# Patient Record
Sex: Male | Born: 1965 | Race: Black or African American | Hispanic: No | Marital: Married | Smoking: Former smoker
Health system: Southern US, Community
[De-identification: ages and names within clinical notes are randomized; demographics above are authoritative.]

## PROBLEM LIST (undated history)

## (undated) DIAGNOSIS — K219 Gastro-esophageal reflux disease without esophagitis: Secondary | ICD-10-CM

## (undated) DIAGNOSIS — R12 Heartburn: Secondary | ICD-10-CM

## (undated) DIAGNOSIS — G4730 Sleep apnea, unspecified: Secondary | ICD-10-CM

## (undated) DIAGNOSIS — M069 Rheumatoid arthritis, unspecified: Secondary | ICD-10-CM

## (undated) DIAGNOSIS — J189 Pneumonia, unspecified organism: Secondary | ICD-10-CM

## (undated) DIAGNOSIS — R1310 Dysphagia, unspecified: Secondary | ICD-10-CM

## (undated) DIAGNOSIS — M549 Dorsalgia, unspecified: Secondary | ICD-10-CM

## (undated) DIAGNOSIS — M25559 Pain in unspecified hip: Secondary | ICD-10-CM

## (undated) DIAGNOSIS — I10 Essential (primary) hypertension: Secondary | ICD-10-CM

## (undated) DIAGNOSIS — M25569 Pain in unspecified knee: Secondary | ICD-10-CM

## (undated) DIAGNOSIS — D649 Anemia, unspecified: Secondary | ICD-10-CM

## (undated) DIAGNOSIS — E7800 Pure hypercholesterolemia, unspecified: Secondary | ICD-10-CM

## (undated) DIAGNOSIS — M109 Gout, unspecified: Secondary | ICD-10-CM

## (undated) HISTORY — DX: Pain in unspecified hip: M25.559

## (undated) HISTORY — PX: HERNIA REPAIR: SHX51

## (undated) HISTORY — DX: Heartburn: R12

## (undated) HISTORY — DX: Rheumatoid arthritis, unspecified: M06.9

## (undated) HISTORY — DX: Dysphagia, unspecified: R13.10

## (undated) HISTORY — DX: Anemia, unspecified: D64.9

## (undated) HISTORY — DX: Pure hypercholesterolemia, unspecified: E78.00

## (undated) HISTORY — DX: Dorsalgia, unspecified: M54.9

## (undated) HISTORY — DX: Pain in unspecified knee: M25.569

## (undated) HISTORY — DX: Gout, unspecified: M10.9

## (undated) HISTORY — DX: Sleep apnea, unspecified: G47.30

---

## 2018-01-27 ENCOUNTER — Other Ambulatory Visit: Payer: Self-pay

## 2018-01-27 ENCOUNTER — Emergency Department (HOSPITAL_COMMUNITY): Payer: Self-pay

## 2018-01-27 ENCOUNTER — Encounter (HOSPITAL_COMMUNITY): Payer: Self-pay | Admitting: *Deleted

## 2018-01-27 ENCOUNTER — Emergency Department (HOSPITAL_COMMUNITY)
Admission: EM | Admit: 2018-01-27 | Discharge: 2018-01-27 | Disposition: A | Discharge status: Home / Self Care | Payer: Self-pay | Attending: Emergency Medicine | Admitting: Emergency Medicine

## 2018-01-27 ENCOUNTER — Encounter: Payer: Self-pay | Admitting: Neurology

## 2018-01-27 DIAGNOSIS — Z79899 Other long term (current) drug therapy: Secondary | ICD-10-CM | POA: Insufficient documentation

## 2018-01-27 DIAGNOSIS — G43109 Migraine with aura, not intractable, without status migrainosus: Secondary | ICD-10-CM | POA: Insufficient documentation

## 2018-01-27 DIAGNOSIS — R0602 Shortness of breath: Secondary | ICD-10-CM | POA: Insufficient documentation

## 2018-01-27 DIAGNOSIS — R0789 Other chest pain: Secondary | ICD-10-CM | POA: Insufficient documentation

## 2018-01-27 DIAGNOSIS — I10 Essential (primary) hypertension: Secondary | ICD-10-CM | POA: Insufficient documentation

## 2018-01-27 HISTORY — DX: Gastro-esophageal reflux disease without esophagitis: K21.9

## 2018-01-27 HISTORY — DX: Essential (primary) hypertension: I10

## 2018-01-27 LAB — CBC WITH DIFFERENTIAL/PLATELET
Basophils Absolute: 0 10*3/uL (ref 0.0–0.1)
Basophils Relative: 0 %
EOS ABS: 0.1 10*3/uL (ref 0.0–0.7)
Eosinophils Relative: 2 %
HCT: 46.8 % (ref 39.0–52.0)
HEMOGLOBIN: 16.2 g/dL (ref 13.0–17.0)
Lymphocytes Relative: 19 %
Lymphs Abs: 1.5 10*3/uL (ref 0.7–4.0)
MCH: 29.5 pg (ref 26.0–34.0)
MCHC: 34.6 g/dL (ref 30.0–36.0)
MCV: 85.2 fL (ref 78.0–100.0)
MONO ABS: 0.9 10*3/uL (ref 0.1–1.0)
MONOS PCT: 12 %
NEUTROS PCT: 67 %
Neutro Abs: 5.1 10*3/uL (ref 1.7–7.7)
Platelets: 249 10*3/uL (ref 150–400)
RBC: 5.49 MIL/uL (ref 4.22–5.81)
RDW: 13.7 % (ref 11.5–15.5)
WBC: 7.7 10*3/uL (ref 4.0–10.5)

## 2018-01-27 LAB — BASIC METABOLIC PANEL
Anion gap: 8 (ref 5–15)
BUN: 8 mg/dL (ref 6–20)
CALCIUM: 9.5 mg/dL (ref 8.9–10.3)
CO2: 25 mmol/L (ref 22–32)
CREATININE: 0.72 mg/dL (ref 0.61–1.24)
Chloride: 102 mmol/L (ref 101–111)
GFR calc non Af Amer: >60 mL/min (ref >60)
Glucose, Bld: 99 mg/dL (ref 65–99)
Potassium: 4 mmol/L (ref 3.5–5.1)
SODIUM: 135 mmol/L (ref 135–145)

## 2018-01-27 LAB — I-STAT TROPONIN, ED
TROPONIN I, POC: 0.01 ng/mL (ref 0.00–0.08)
Troponin i, poc: 0 ng/mL (ref 0.00–0.08)

## 2018-01-27 LAB — D-DIMER, QUANTITATIVE: D-Dimer, Quant: 0.42 ug/mL-FEU (ref 0.00–0.50)

## 2018-01-27 MED ORDER — SODIUM CHLORIDE 0.9 % IV BOLUS (SEPSIS)
1000.0000 mL | Freq: Once | INTRAVENOUS | Status: AC
  Administered 2018-01-27: 1000 mL via INTRAVENOUS

## 2018-01-27 MED ORDER — ALBUTEROL SULFATE (2.5 MG/3ML) 0.083% IN NEBU
5.0000 mg | INHALATION_SOLUTION | Freq: Once | RESPIRATORY_TRACT | Status: AC
  Administered 2018-01-27: 5 mg via RESPIRATORY_TRACT
  Filled 2018-01-27: qty 6

## 2018-01-27 MED ORDER — PROCHLORPERAZINE EDISYLATE 5 MG/ML IJ SOLN
10.0000 mg | Freq: Once | INTRAMUSCULAR | Status: AC
  Administered 2018-01-27: 10 mg via INTRAVENOUS
  Filled 2018-01-27: qty 2

## 2018-01-27 MED ORDER — BUTALBITAL-APAP-CAFFEINE 50-325-40 MG PO TABS: 1.0000 | ORAL_TABLET | Freq: Four times a day (QID) | ORAL | 0 refills | Status: DC | PRN

## 2018-01-27 MED ORDER — DIPHENHYDRAMINE HCL 50 MG/ML IJ SOLN
25.0000 mg | Freq: Once | INTRAMUSCULAR | Status: AC
  Administered 2018-01-27: 25 mg via INTRAVENOUS
  Filled 2018-01-27: qty 1

## 2018-01-27 NOTE — Discharge Instructions (Signed)
Follow up with your PCP.  Neurology should call you to set up and appointment to be seen for your headaches.

## 2018-01-27 NOTE — ED Triage Notes (Addendum)
Pt developed headache last night, took Tylenol without relief, this morning headache worse as well as SHOB, pt has some nausea and chest pain a little earlier

## 2018-01-27 NOTE — ED Provider Notes (Signed)
[facility] DEPT Provider Note   CSN: [id number] Arrival date & time: 01/27/18  0803     History   Chief Complaint Chief Complaint  Patient presents with  . Shortness of Breath  . Headache    HPI Darrell Conway is a 52 y.o. male.  52 yo M with a chief complaint of headache.  Patient has been having headaches off and on since he had a dental extraction done about 2 weeks ago.  He has a history of migraines and states this feels the same.  Throbbing over his left eye.  Worse with bright lights and loud noises.  Has had some nausea and vomiting as well.  The patient has taken 2 doses of Tylenol without improvement.  This usually resolves his symptoms.  He also had some chest pressure and shortness of breath that started about an hour ago.  Occurred at rest.  Occurred in the recumbent position.  Nothing seems to make this better or worse.  Exertion is not made it worse.  Denies lower extremity edema denies hemoptysis denies cough congestion or fever.  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  No history of DVT or PE.  No lower extremity edema.  Denies trauma.  Patient has a history of hypertension.  Denies hyperlipidemia diabetes smoking or family history.   The history is provided by the patient.  Shortness of Breath  This is a new problem. The average episode lasts 1 hour. The problem occurs continuously.The current episode started less than 1 hour ago. The problem has been gradually improving. Associated symptoms include headaches and chest pain. Pertinent negatives include no fever, no vomiting, no abdominal pain and no rash. He has tried nothing for the symptoms. The treatment provided no relief. He has had no prior hospitalizations. Associated medical issues do not include asthma, COPD, PE, past MI or DVT.  Headache   Associated symptoms include shortness of breath. Pertinent negatives include no fever, no palpitations and no  vomiting.    Past Medical History:  Diagnosis Date  . GERD (gastroesophageal reflux disease)   . Hypertension     There are no active problems to display for this patient.   History reviewed. No pertinent surgical history.     Home Medications    Prior to Admission medications   Medication Sig Start Date End Date Taking? Authorizing Provider  amLODipine (NORVASC) 10 MG tablet Take 10 mg by mouth daily.   Yes [provider]  lisinopril (PRINIVIL,ZESTRIL) 20 MG tablet Take 20 mg by mouth daily.   Yes [provider]  Multiple Vitamins-Minerals (MENS MULTIVITAMIN PLUS PO) Take 1 tablet by mouth daily.   Yes [provider]  omeprazole (PRILOSEC) 40 MG capsule Take 40 mg by mouth daily.   Yes [provider]  butalbital-acetaminophen-caffeine (FIORICET, ESGIC) 50-325-40 MG tablet Take 1-2 tablets by mouth every 6 (six) hours as needed for headache. 01/27/18 01/27/19  Deno Etienne, DO    Family History No family history on file.  Social History Social History   Tobacco Use  . Smoking status: Never Smoker  . Smokeless tobacco: Never Used  Substance Use Topics  . Alcohol use: No    Frequency: Never  . Drug use: No     Allergies   Patient has no known allergies.   Review of Systems Review of Systems  Constitutional: Negative for chills and fever.  HENT: Negative for congestion and facial swelling.   Eyes: Negative for  discharge and visual disturbance.  Respiratory: Positive for shortness of breath.   Cardiovascular: Positive for chest pain. Negative for palpitations.  Gastrointestinal: Negative for abdominal pain, diarrhea and vomiting.  Musculoskeletal: Negative for arthralgias and myalgias.  Skin: Negative for color change and rash.  Neurological: Positive for headaches. Negative for tremors and syncope.  Psychiatric/Behavioral: Negative for confusion and dysphoric mood.     Physical Exam Updated Vital Signs BP 114/78   Pulse  (!) 111   Temp 98.6 F (37 C) (Oral)   Resp (!) 21   Ht 5\' 11"  (1.803 m)   Wt 127 kg (280 lb)   SpO2 95%   BMI 39.05 kg/m   Physical Exam  Constitutional: He is oriented to person, place, and time. He appears well-developed and well-nourished.  HENT:  Head: Normocephalic and atraumatic.  Eyes: EOM are normal. Pupils are equal, round, and reactive to light.  Neck: Normal range of motion. Neck supple. No JVD present.  Cardiovascular: Normal rate and regular rhythm. Exam reveals no gallop and no friction rub.  No murmur heard. Pulmonary/Chest: No respiratory distress. He has no wheezes.  Abdominal: He exhibits no distension. There is no rebound and no guarding.  Musculoskeletal: Normal range of motion.  Neurological: He is alert and oriented to person, place, and time. He has normal strength. No cranial nerve deficit or sensory deficit. Coordination normal.  Reflex Scores:      Patellar reflexes are 2+ on the right side and 2+ on the left side.      Achilles reflexes are 2+ on the right side and 2+ on the left side. Skin: No rash noted. No pallor.  Psychiatric: He has a normal mood and affect. His behavior is normal.  Nursing note and vitals reviewed.    ED Treatments / Results  Labs (all labs ordered are listed, but only abnormal results are displayed) Labs Reviewed  CBC WITH DIFFERENTIAL/PLATELET  BASIC METABOLIC PANEL  D-DIMER, QUANTITATIVE (NOT AT [facility])  I-STAT TROPONIN, ED  I-STAT TROPONIN, ED    EKG  EKG Interpretation  Date/Time:  Wednesday January 27 2018 08:17:01 EST Ventricular Rate:  72 PR Interval:    QRS Duration: 94 QT Interval:  380 QTC Calculation: 416 R Axis:   17 Text Interpretation:  Sinus rhythm Borderline T wave abnormalities Baseline wander in lead(s) V2 No old tracing to compare Confirmed by Sherwood Gambler [phone]) on 01/27/2018 8:19:21 AM Also confirmed by Sherwood Gambler [id number]), editor Philomena Doheny [phone])  on 01/27/2018 11:29:37 AM        Radiology Dg Chest 2 View  Result Date: 01/27/2018 CLINICAL DATA:  Shortness of breath, headache EXAM: CHEST - 2 VIEW COMPARISON:  None. FINDINGS: Bibasilar atelectasis. Heart is normal size. No effusions or acute bony abnormality. IMPRESSION: Bibasilar atelectasis. Electronically Signed   By: Rolm Baptise M.D.   On: 01/27/2018 09:25    Procedures Procedures (including critical care time)  Medications Ordered in ED Medications  albuterol (PROVENTIL) (2.5 MG/3ML) 0.083% nebulizer solution 5 mg (5 mg Nebulization Given 01/27/18 0932)  prochlorperazine (COMPAZINE) injection 10 mg (10 mg Intravenous Given 01/27/18 0944)  diphenhydrAMINE (BENADRYL) injection 25 mg (25 mg Intravenous Given 01/27/18 0944)  sodium chloride 0.9 % bolus 1,000 mL (0 mLs Intravenous Stopped 01/27/18 1350)     Initial Impression / Assessment and Plan / ED Course  I have reviewed the triage vital signs and the nursing notes.  Pertinent labs & imaging results that were available during my care of the patient  were reviewed by me and considered in my medical decision making (see chart for details).     52 yo M with 2 separate complaints.  Patient has a history of worsening of his normal migraines.  Will treat with a headache cocktail.  The patient is also complaining of atypical chest pain.  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  Will obtain a d-dimer.  Delta troponin.  D-dimer is negative.  Delta troponin is negative.  Patient is feeling much better after his headache cocktail.  Discharge home.  3:09 PM:  I have discussed the diagnosis/risks/treatment options with the patient and family and believe the pt to be eligible for discharge home to follow-up with PCP, neuro. We also discussed returning to the ED immediately if new or worsening sx occur. We discussed the sx which are most concerning (e.g., sudden worsening pain, fever, inability to tolerate by mouth) that necessitate  immediate return. Medications administered to the patient during their visit and any new prescriptions provided to the patient are listed below.  Medications given during this visit Medications  albuterol (PROVENTIL) (2.5 MG/3ML) 0.083% nebulizer solution 5 mg (5 mg Nebulization Given 01/27/18 0932)  prochlorperazine (COMPAZINE) injection 10 mg (10 mg Intravenous Given 01/27/18 0944)  diphenhydrAMINE (BENADRYL) injection 25 mg (25 mg Intravenous Given 01/27/18 0944)  sodium chloride 0.9 % bolus 1,000 mL (0 mLs Intravenous Stopped 01/27/18 1350)     The patient appears reasonably screen and/or stabilized for discharge and I doubt any other medical condition or other [facility] requiring further screening, evaluation, or treatment in the ED at this time prior to discharge.    Final Clinical Impressions(s) / ED Diagnoses   Final diagnoses:  Migraine with typical aura  Atypical chest pain    ED Discharge Orders        Ordered    Ambulatory referral to Neurology    Comments:  Headache syndrome   01/27/18 1407    butalbital-acetaminophen-caffeine (FIORICET, ESGIC) 50-325-40 MG tablet  Every 6 hours PRN     01/27/18 [place], [place], DO 01/27/18 1509

## 2018-04-02 ENCOUNTER — Ambulatory Visit: Payer: Self-pay | Admitting: Neurology

## 2018-05-13 ENCOUNTER — Ambulatory Visit: Payer: BLUE CROSS/BLUE SHIELD | Admitting: Neurology

## 2018-05-13 ENCOUNTER — Encounter

## 2018-05-29 ENCOUNTER — Other Ambulatory Visit: Payer: Self-pay

## 2018-05-29 ENCOUNTER — Encounter (HOSPITAL_COMMUNITY): Payer: Self-pay | Admitting: Emergency Medicine

## 2018-05-29 ENCOUNTER — Emergency Department (HOSPITAL_COMMUNITY)
Admission: EM | Admit: 2018-05-29 | Discharge: 2018-05-29 | Disposition: A | Discharge status: Home / Self Care | Payer: Self-pay | Attending: Emergency Medicine | Admitting: Emergency Medicine

## 2018-05-29 DIAGNOSIS — I10 Essential (primary) hypertension: Secondary | ICD-10-CM | POA: Insufficient documentation

## 2018-05-29 DIAGNOSIS — M2550 Pain in unspecified joint: Secondary | ICD-10-CM | POA: Insufficient documentation

## 2018-05-29 DIAGNOSIS — Z79899 Other long term (current) drug therapy: Secondary | ICD-10-CM | POA: Insufficient documentation

## 2018-05-29 LAB — BASIC METABOLIC PANEL
Anion gap: 7 (ref 5–15)
BUN: 9 mg/dL (ref 6–20)
CHLORIDE: 107 mmol/L (ref 98–111)
CO2: 26 mmol/L (ref 22–32)
CREATININE: 0.71 mg/dL (ref 0.61–1.24)
Calcium: 9.4 mg/dL (ref 8.9–10.3)
GFR calc non Af Amer: >60 mL/min (ref >60)
Glucose, Bld: 103 mg/dL — ABNORMAL HIGH (ref 70–99)
Potassium: 4.1 mmol/L (ref 3.5–5.1)
SODIUM: 140 mmol/L (ref 135–145)

## 2018-05-29 LAB — CBC WITH DIFFERENTIAL/PLATELET
BASOS PCT: 1 %
Basophils Absolute: 0 10*3/uL (ref 0.0–0.1)
EOS ABS: 0.2 10*3/uL (ref 0.0–0.7)
Eosinophils Relative: 3 %
HCT: 44 % (ref 39.0–52.0)
HEMOGLOBIN: 15.3 g/dL (ref 13.0–17.0)
Lymphocytes Relative: 36 %
Lymphs Abs: 2.3 10*3/uL (ref 0.7–4.0)
MCH: 29.4 pg (ref 26.0–34.0)
MCHC: 34.8 g/dL (ref 30.0–36.0)
MCV: 84.6 fL (ref 78.0–100.0)
MONO ABS: 0.6 10*3/uL (ref 0.1–1.0)
MONOS PCT: 9 %
NEUTROS PCT: 51 %
Neutro Abs: 3.4 10*3/uL (ref 1.7–7.7)
Platelets: 246 10*3/uL (ref 150–400)
RBC: 5.2 MIL/uL (ref 4.22–5.81)
RDW: 14 % (ref 11.5–15.5)
WBC: 6.5 10*3/uL (ref 4.0–10.5)

## 2018-05-29 LAB — CK: CK TOTAL: 248 U/L (ref 49–397)

## 2018-05-29 MED ORDER — KETOROLAC TROMETHAMINE 60 MG/2ML IM SOLN
30.0000 mg | Freq: Once | INTRAMUSCULAR | Status: AC
  Administered 2018-05-29: 30 mg via INTRAMUSCULAR
  Filled 2018-05-29: qty 2

## 2018-05-29 MED ORDER — MELOXICAM 15 MG PO TABS: 15.0000 mg | ORAL_TABLET | Freq: Every day | ORAL | 0 refills | Status: DC

## 2018-05-29 NOTE — ED Triage Notes (Signed)
Patient is complaining of joint pain (hands, knees, elbows, back). Patient states this started 4 days ago.

## 2018-05-29 NOTE — ED Provider Notes (Signed)
[facility] DEPT Provider Note   CSN: [id number] Arrival date & time: 05/29/18  0428     History   Chief Complaint Chief Complaint  Patient presents with  . Hypertension  . Joint Pain    HPI Darrell Conway is a 52 y.o. male.  HPI  Darrell Conway is a 52 y.o. male with hx of htn, presents to ED with complaint of joint paints. Pt stats pain started about 4 days ago. Reports pain in bilateral knees, bilateral elbows, wrists, hands, and lower back. States painful to move those joints and ambulate.  He has been taking ibuprofen which has not helped.  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  He states he no longer takes any opiate prescription medications.  He states he used to also see an orthopedic specialist and get knee injections in the past and was told that he may need a knee replacement.  He states he has been working long hours recently in the heat.  He denies any fever or chills.  He denies any redness or joint swelling.  No injuries.   Past Medical History:  Diagnosis Date  . GERD (gastroesophageal reflux disease)   . Hypertension     There are no active problems to display for this patient.   History reviewed. No pertinent surgical history.      Home Medications    Prior to Admission medications   Medication Sig Start Date End Date Taking? Authorizing Provider  amLODipine (NORVASC) 10 MG tablet Take 10 mg by mouth daily.    [provider]  butalbital-acetaminophen-caffeine (FIORICET, ESGIC) 50-325-40 MG tablet Take 1-2 tablets by mouth every 6 (six) hours as needed for headache. 01/27/18 01/27/19  Deno Etienne, DO  lisinopril (PRINIVIL,ZESTRIL) 20 MG tablet Take 20 mg by mouth daily.    [provider]  Multiple Vitamins-Minerals (MENS MULTIVITAMIN PLUS PO) Take 1 tablet by mouth daily.    [provider]  omeprazole  (PRILOSEC) 40 MG capsule Take 40 mg by mouth daily.    [provider]    Family History History reviewed. No pertinent family history.  Social History Social History   Tobacco Use  . Smoking status: Never Smoker  . Smokeless tobacco: Never Used  Substance Use Topics  . Alcohol use: No    Frequency: Never  . Drug use: No     Allergies   Patient has no known allergies.   Review of Systems Review of Systems  Constitutional: Negative for chills and fever.  Respiratory: Negative for cough, chest tightness and shortness of breath.   Cardiovascular: Negative for chest pain, palpitations and leg swelling.  Gastrointestinal: Negative for abdominal distention, abdominal pain, diarrhea, nausea and vomiting.  Genitourinary: Negative for dysuria, frequency, hematuria and urgency.  Musculoskeletal: Positive for arthralgias, back pain and myalgias. Negative for neck pain and neck stiffness.  Skin: Negative for rash.  Allergic/Immunologic: Negative for immunocompromised state.  Neurological: Negative for dizziness, weakness, light-headedness, numbness and headaches.  All other systems reviewed and are negative.    Physical Exam Updated Vital Signs BP 123/79   Pulse 72   Temp 98.3 F (36.8 C) (Oral)   Resp 17   Ht 5\' 11"  (1.803 m)   Wt 131.5 kg (290 lb)   SpO2 93%   BMI 40.45 kg/m   Physical Exam  Constitutional: He appears well-developed and well-nourished. No distress.  HENT:  Head: Normocephalic and atraumatic.  Eyes: Conjunctivae are normal.  Neck: Neck supple.  Cardiovascular: Normal rate, regular rhythm and normal heart sounds.  Pulmonary/Chest: Effort normal. No respiratory distress. He has no wheezes. He has no rales.  Abdominal: Soft. Bowel sounds are normal. He exhibits no distension. There is no tenderness. There is no rebound.  Musculoskeletal: He exhibits no edema.  Normal-appearing bilateral elbows, wrists, hands, knees and joints.  No erythema,  obvious swelling or joint effusion.  Full range of motion of all joints.  Distal radial and dorsalis pedis pulses intact and equal bilaterally.  Neurological: He is alert.  Skin: Skin is warm and dry.  Nursing note and vitals reviewed.    ED Treatments / Results  Labs (all labs ordered are listed, but only abnormal results are displayed) Labs Reviewed  CBC WITH DIFFERENTIAL/PLATELET  BASIC METABOLIC PANEL  CK    EKG None  Radiology No results found.  Procedures Procedures (including critical care time)  Medications Ordered in ED Medications - No data to display   Initial Impression / Assessment and Plan / ED Course  I have reviewed the triage vital signs and the nursing notes.  Pertinent labs & imaging results that were available during my care of the patient were reviewed by me and considered in my medical decision making (see chart for details).     Patient in ED with diffuse arthralgias.  No signs of infection or injury.  Will check basic labs.  Will check CK level since patient states he has been outside in the heat a lot.  Could be autoimmune process and may need more outpatient work up. Will try toradol IM for pain.   8:46 AM Labs unremarkable. Will dc home with nsaids. Discussed follow up with pcp for further work up. Pt agreed.   Vitals:   05/29/18 0630 05/29/18 0735 05/29/18 0800 05/29/18 0845  BP: 123/79 (!) 127/92 131/90 137/87  Pulse: 72 67 66 63  Resp:  19 17 18   Temp:    97.6 F (36.4 C)  TempSrc:    Oral  SpO2: 93% 91% 94% 93%  Weight:      Height:         Final Clinical Impressions(s) / ED Diagnoses   Final diagnoses:  [place]    ED Discharge Orders        Ordered    meloxicam (MOBIC) 15 MG tablet  Daily     05/29/18 0850       Jeannett Senior, PA-C 05/29/18 1645    Duffy Bruce, MD 05/29/18 1941

## 2018-05-29 NOTE — Discharge Instructions (Signed)
Avoid strenuous activity. Start knee exercises. Mobic for pain and inflammation. If pain not improving, follow up with primary care doctor for further evaluation and possible for work up for rheumatological cause of your joint pains

## 2018-08-19 ENCOUNTER — Encounter (HOSPITAL_COMMUNITY): Payer: Self-pay

## 2018-08-19 ENCOUNTER — Other Ambulatory Visit: Payer: Self-pay

## 2018-08-19 ENCOUNTER — Emergency Department (HOSPITAL_COMMUNITY)
Admission: EM | Admit: 2018-08-19 | Discharge: 2018-08-19 | Disposition: A | Discharge status: Home / Self Care | Payer: Self-pay | Attending: Emergency Medicine | Admitting: Emergency Medicine

## 2018-08-19 DIAGNOSIS — W19XXXA Unspecified fall, initial encounter: Secondary | ICD-10-CM

## 2018-08-19 DIAGNOSIS — W1830XA Fall on same level, unspecified, initial encounter: Secondary | ICD-10-CM | POA: Insufficient documentation

## 2018-08-19 DIAGNOSIS — M2550 Pain in unspecified joint: Secondary | ICD-10-CM | POA: Insufficient documentation

## 2018-08-19 DIAGNOSIS — Y999 Unspecified external cause status: Secondary | ICD-10-CM | POA: Insufficient documentation

## 2018-08-19 DIAGNOSIS — Y939 Activity, unspecified: Secondary | ICD-10-CM | POA: Insufficient documentation

## 2018-08-19 DIAGNOSIS — Z79899 Other long term (current) drug therapy: Secondary | ICD-10-CM | POA: Insufficient documentation

## 2018-08-19 DIAGNOSIS — G8929 Other chronic pain: Secondary | ICD-10-CM | POA: Insufficient documentation

## 2018-08-19 DIAGNOSIS — Y929 Unspecified place or not applicable: Secondary | ICD-10-CM | POA: Insufficient documentation

## 2018-08-19 DIAGNOSIS — M545 Low back pain: Secondary | ICD-10-CM | POA: Insufficient documentation

## 2018-08-19 MED ORDER — LIDOCAINE 5 % EX PTCH
1.0000 | MEDICATED_PATCH | CUTANEOUS | Status: DC
  Administered 2018-08-19: 1 via TRANSDERMAL
  Filled 2018-08-19: qty 1

## 2018-08-19 MED ORDER — KETOROLAC TROMETHAMINE 30 MG/ML IJ SOLN
15.0000 mg | Freq: Once | INTRAMUSCULAR | Status: AC
  Administered 2018-08-19: 15 mg via INTRAMUSCULAR
  Filled 2018-08-19: qty 1

## 2018-08-19 MED ORDER — METHOCARBAMOL 500 MG PO TABS: 500.0000 mg | ORAL_TABLET | Freq: Two times a day (BID) | ORAL | 0 refills | Status: DC

## 2018-08-19 MED ORDER — OXYCODONE-ACETAMINOPHEN 5-325 MG PO TABS
1.0000 | ORAL_TABLET | Freq: Once | ORAL | Status: AC
  Administered 2018-08-19: 1 via ORAL
  Filled 2018-08-19: qty 1

## 2018-08-19 MED ORDER — METHOCARBAMOL 500 MG PO TABS
1000.0000 mg | ORAL_TABLET | Freq: Once | ORAL | Status: AC
  Administered 2018-08-19: 1000 mg via ORAL
  Filled 2018-08-19: qty 2

## 2018-08-19 NOTE — ED Triage Notes (Signed)
Pt states that he has a hx of joint pain , and used to be given pain medicine. Pt states now he does pain shots. Pt states that the pain has gotten to the point where he cannot sleep. Pt states it is painful to walk and everything "pops". Pt states that he stopped pain pills sometime around January/February.  Pt states that the stopped taking them because he couldn't drive.

## 2018-08-19 NOTE — Discharge Instructions (Signed)
Please continue taking Mobic although it may not provide significant improvement in your pain it can help with inflammation into addition to this he may take Tylenol, and you can also use Robaxin for stiffness and muscle pain.  This can cause drowsiness do not take before driving.  You may also use over-the-counter salon pas lidocaine patches, ice and heat.  Please use the phone number provided on your paperwork today to establish care with a primary care doctor or follow-up with the [facility].  You can also follow-up with orthopedics for continued evaluation of your joint pain.

## 2018-08-19 NOTE — ED Provider Notes (Signed)
[facility] DEPT Provider Note   CSN: [id number] Arrival date & time: 08/19/18  1541     History   Chief Complaint Chief Complaint  Patient presents with  . Joint Pain    HPI Darrell Conway is a 52 y.o. male.  Darrell Conway is a 52 y.o. Male hypertension, GERD and joint pain, who presents to the emergency department with polyarthralgias.  Patient reports for the past 2 weeks he been having back pain pain in both shoulders, both knees, hips, left elbow and left hand.  He reports history of issues with joint pain for many years, was seeing a pain clinic, then discontinued pain medications and was receiving joint injections, but he recently moved to the area and has not had anyone to follow-up with regarding this.  He has been taking Mobic without improvement has not tried anything else to treat his symptoms.  He reports today it felt like his knee was going to give out and he did fall landing on the carpeted floor on his buttock and right side, reports no significant worsening in pain after fall, just that his pain has continued.  He denies any numbness or weakness in his lower extremities, no saddle anesthesia and no loss of bowel or bladder control.  No abdominal pain, chest pain or shortness of breath.  No focal swelling, redness or warmth over any one joint and no fevers or chills.  Pt did not hit his head after the fall, no LOC, denies any neck pain.     Past Medical History:  Diagnosis Date  . GERD (gastroesophageal reflux disease)   . Hypertension     There are no active problems to display for this patient.   History reviewed. No pertinent surgical history.      Home Medications    Prior to Admission medications   Medication Sig Start Date End Date Taking? Authorizing Provider  amLODipine (NORVASC) 10 MG tablet Take 10 mg by mouth daily.    [provider]  butalbital-acetaminophen-caffeine (FIORICET, ESGIC) 50-325-40 MG tablet  Take 1-2 tablets by mouth every 6 (six) hours as needed for headache. 01/27/18 01/27/19  Deno Etienne, DO  lisinopril (PRINIVIL,ZESTRIL) 20 MG tablet Take 20 mg by mouth daily.    [provider]  meloxicam (MOBIC) 15 MG tablet Take 1 tablet (15 mg total) by mouth daily. 05/29/18   Kirichenko, Lahoma Rocker, PA-C  Multiple Vitamins-Minerals (MENS MULTIVITAMIN PLUS PO) Take 1 tablet by mouth daily.    [provider]  omeprazole (PRILOSEC) 40 MG capsule Take 40 mg by mouth daily.    [provider]    Family History No family history on file.  Social History Social History   Tobacco Use  . Smoking status: Never Smoker  . Smokeless tobacco: Never Used  Substance Use Topics  . Alcohol use: No    Frequency: Never  . Drug use: No     Allergies   Patient has no known allergies.   Review of Systems Review of Systems  Constitutional: Negative for chills and fever.  HENT: Negative.   Eyes: Negative for visual disturbance.  Respiratory: Negative for cough and shortness of breath.   Cardiovascular: Negative for chest pain.  Gastrointestinal: Negative for abdominal pain, nausea and vomiting.  Genitourinary: Negative for dysuria and frequency.  Musculoskeletal: Positive for arthralgias, back pain, joint swelling and myalgias. Negative for neck pain and neck stiffness.  Skin: Negative for color change, rash and wound.  Neurological: Negative for weakness  and numbness.     Physical Exam Updated Vital Signs BP (!) 137/97 (BP Location: Right Arm) Comment: Pt took BP meds today.  Pulse 97   Temp 98.6 F (37 C) (Oral)   Resp 18   Ht 5\' 11"  (1.803 m)   Wt 134.7 kg   SpO2 96%   BMI 41.41 kg/m   Physical Exam  Constitutional: He appears well-developed and well-nourished. No distress.  HENT:  Head: Normocephalic and atraumatic.  Eyes: Right eye exhibits no discharge. Left eye exhibits no discharge.  Neck: Normal range of motion. Neck supple.  C-spine nontender to  palpation  Cardiovascular: Normal rate, regular rhythm, normal heart sounds and intact distal pulses.  Pulses:      Radial pulses are 2+ on the right side, and 2+ on the left side.       Dorsalis pedis pulses are 2+ on the right side, and 2+ on the left side.       Posterior tibial pulses are 2+ on the right side, and 2+ on the left side.  Pulmonary/Chest: Effort normal and breath sounds normal. No respiratory distress.  Respirations equal and unlabored, patient able to speak in full sentences, lungs clear to auscultation bilaterally  Abdominal: Soft. Bowel sounds are normal. He exhibits no distension and no mass. There is no tenderness. There is no guarding.  Abdomen soft, nondistended, nontender to palpation in all quadrants without guarding or peritoneal signs  Musculoskeletal:  Tenderness to palpation over bilateral knees with minimal prepatellar swelling, tenderness over both shoulders without palpable deformity, tenderness over the left elbow and bilateral wrists without any palpable deformities.  There is no focal erythema, warmth or swelling over any one joint, and range of motion is intact in all joints with some discomfort.  2+ pulses in all distal extremities sensation and strength intact. The lower back is diffusely tender to palpation without focal tenderness or deformity, no sciatica  Neurological: He is alert. Coordination normal.  Speech is clear, able to follow commands CN III-XII intact Normal strength in upper and lower extremities bilaterally including dorsiflexion and plantar flexion, strong and equal grip strength Sensation normal to light and sharp touch Moves extremities without ataxia, coordination intact  Skin: Skin is warm and dry. Capillary refill takes less than 2 seconds. He is not diaphoretic.  Psychiatric: He has a normal mood and affect. His behavior is normal.  Nursing note and vitals reviewed.    ED Treatments / Results  Labs (all labs ordered are listed,  but only abnormal results are displayed) Labs Reviewed - No data to display  EKG None  Radiology No results found.  Procedures Procedures (including critical care time)  Medications Ordered in ED Medications  lidocaine (LIDODERM) 5 % 1 patch (1 patch Transdermal Patch Applied 08/19/18 1757)  ketorolac (TORADOL) 30 MG/ML injection 15 mg (15 mg Intramuscular Given 08/19/18 1759)  methocarbamol (ROBAXIN) tablet 1,000 mg (1,000 mg Oral Given 08/19/18 1800)  oxyCODONE-acetaminophen (PERCOCET/ROXICET) 5-325 MG per tablet 1 tablet (1 tablet Oral Given 08/19/18 1759)     Initial Impression / Assessment and Plan / ED Course  I have reviewed the triage vital signs and the nursing notes.  Pertinent labs & imaging results that were available during my care of the patient were reviewed by me and considered in my medical decision making (see chart for details).  Patient presents for evaluation of polyarthralgias.  Reports long history of pain in multiple joints, has been on chronic pain medication as well as  shots in the past but stopped these medications in January and has moved here recently and has not had anyone to follow-up with.  Reports has been taking Mobic without improvement, no fevers or chills, no swelling redness or warmth over any one joint, exam non-concerning for septic arthritis, all joints are easily movable with some discomfort, all compartments are soft, and all extremities are neurovascularly intact.  Patient did have a fall today landing on his back and right side but reports no worsening pain and no palpable deformities on exam offered x-ray of the lower back but patient declined reports he would rather follow-up outpatient with orthopedics and he does not feel like his back pain is worse than usual.  Pain treated here in the ED with significant improvement.  Provided referrals for primary care and orthopedics for follow-up encouraged NSAIDs, muscle relaxers and lidocaine patches as  well as ice and heat to treat symptoms in the meantime, given that these arthralgias are chronic in nature and there does not appear to be new acute pain do not feel that any further work-up or imaging is warranted at this time.  At this time there does not appear to be any evidence of an acute emergency medical condition and the patient appears stable for discharge with appropriate outpatient follow up.Diagnosis was discussed with patient who verbalizes understanding and is agreeable to discharge.  Final Clinical Impressions(s) / ED Diagnoses   Final diagnoses:  Polyarthralgia  Chronic midline low back pain without sciatica  Fall, initial encounter    ED Discharge Orders         Ordered    methocarbamol (ROBAXIN) 500 MG tablet  2 times daily     08/19/18 1826           Janet Berlin 08/19/18 2149    Duffy Bruce, MD 08/20/18 [phone]

## 2018-09-10 ENCOUNTER — Ambulatory Visit (HOSPITAL_COMMUNITY)
Admission: EM | Admit: 2018-09-10 | Discharge: 2018-09-10 | Disposition: A | Discharge status: Home / Self Care | Payer: Self-pay | Attending: Family Medicine | Admitting: Family Medicine

## 2018-09-10 ENCOUNTER — Encounter (HOSPITAL_COMMUNITY): Payer: Self-pay

## 2018-09-10 DIAGNOSIS — K219 Gastro-esophageal reflux disease without esophagitis: Secondary | ICD-10-CM

## 2018-09-10 DIAGNOSIS — I10 Essential (primary) hypertension: Secondary | ICD-10-CM

## 2018-09-10 DIAGNOSIS — Z760 Encounter for issue of repeat prescription: Secondary | ICD-10-CM

## 2018-09-10 DIAGNOSIS — M25461 Effusion, right knee: Secondary | ICD-10-CM

## 2018-09-10 DIAGNOSIS — M25561 Pain in right knee: Secondary | ICD-10-CM

## 2018-09-10 MED ORDER — LISINOPRIL 20 MG PO TABS: 20.0000 mg | ORAL_TABLET | Freq: Every day | ORAL | 1 refills | Status: DC

## 2018-09-10 MED ORDER — PREDNISONE 20 MG PO TABS: 20.0000 mg | ORAL_TABLET | Freq: Two times a day (BID) | ORAL | 0 refills | Status: DC

## 2018-09-10 MED ORDER — OMEPRAZOLE 40 MG PO CPDR: 40.0000 mg | DELAYED_RELEASE_CAPSULE | Freq: Every day | ORAL | 1 refills | Status: DC

## 2018-09-10 MED ORDER — AMLODIPINE BESYLATE 10 MG PO TABS: 10.0000 mg | ORAL_TABLET | Freq: Every day | ORAL | 1 refills | Status: DC

## 2018-09-10 NOTE — Discharge Instructions (Signed)
BP medicine are refilled Omeprazole refilled for the stomach Steroids - prednisone is prescribed for the knee pain I will give you a name of a PCP

## 2018-09-10 NOTE — ED Triage Notes (Signed)
Pt presents to get refill on blood pressure medication.

## 2018-09-10 NOTE — ED Provider Notes (Signed)
[facility]    CSN: [id number] Arrival date & time: 09/10/18  1953     History   Chief Complaint Chief Complaint  Patient presents with  . Medication Refill    blood pressure medication    HPI Darrell Conway is a 52 y.o. male.   HPI  Patient has long-standing hypertension.  Usually well controlled.  He recently moved to the area.  He is been out of his blood pressure pills for 2 weeks.  He is here today requesting refills.  He also like referral to a PCP. He also needs refill of omeprazole.  He is been taking this long-term. He requests medicine for knee pain.  He has long-standing arthritis.  He recently moved, and with all the lifting he bumped his knee and his right knee is hurting.  He has swelling around the knee.  Warmth.  He knows he has arthritis.  He states he does not respond well to Mobic or ibuprofen.  I am going to give him 5 days of prednisone. Patient states when his blood pressure is elevated he is been having some headaches.  No dizziness.  No nausea.  No chest pain or shortness of breath.  Past Medical History:  Diagnosis Date  . GERD (gastroesophageal reflux disease)   . Hypertension     There are no active problems to display for this patient. Hypertension GERD Morbid obesity Polyarthritis, likely osteoarthritis-primary  History reviewed. No pertinent surgical history.     Home Medications    Prior to Admission medications   Medication Sig Start Date End Date Taking? Authorizing Provider  amLODipine (NORVASC) 10 MG tablet Take 1 tablet (10 mg total) by mouth daily. 09/10/18   Raylene Everts, MD  lisinopril (PRINIVIL,ZESTRIL) 20 MG tablet Take 1 tablet (20 mg total) by mouth daily. 09/10/18   Raylene Everts, MD  meloxicam (MOBIC) 15 MG tablet Take 1 tablet (15 mg total) by mouth daily. 05/29/18   Kirichenko, Tatyana, PA-C  methocarbamol (ROBAXIN) 500 MG tablet Take 1 tablet (500 mg total) by mouth 2 (two) times daily. 08/19/18    Jacqlyn Larsen, PA-C  Multiple Vitamins-Minerals (MENS MULTIVITAMIN PLUS PO) Take 1 tablet by mouth daily.    [provider]  omeprazole (PRILOSEC) 40 MG capsule Take 1 capsule (40 mg total) by mouth daily. 09/10/18   Raylene Everts, MD  predniSONE (DELTASONE) 20 MG tablet Take 1 tablet (20 mg total) by mouth 2 (two) times daily with a meal. 09/10/18   Raylene Everts, MD    Family History History reviewed. No pertinent family history.  Social History Social History   Tobacco Use  . Smoking status: Never Smoker  . Smokeless tobacco: Never Used  Substance Use Topics  . Alcohol use: No    Frequency: Never  . Drug use: No     Allergies   Patient has no known allergies.   Review of Systems Review of Systems  Constitutional: Negative for chills and fever.  HENT: Negative for ear pain and sore throat.   Eyes: Negative for pain and visual disturbance.  Respiratory: Negative for cough and shortness of breath.   Cardiovascular: Negative for chest pain and palpitations.  Gastrointestinal: Negative for abdominal pain and vomiting.  Genitourinary: Negative for dysuria and hematuria.  Musculoskeletal: Positive for arthralgias and gait problem. Negative for back pain.  Skin: Negative for color change and rash.  Neurological: Positive for headaches. Negative for seizures and syncope.  All other systems reviewed  and are negative.    Physical Exam Triage Vital Signs ED Triage Vitals  Enc Vitals Group     BP 09/10/18 2021 (!) 145/97     Pulse Rate 09/10/18 2021 86     Resp 09/10/18 2021 20     Temp 09/10/18 2021 98 F (36.7 C)     Temp Source 09/10/18 2021 Oral     SpO2 09/10/18 2021 95 %     Weight --      Height --      Head Circumference --      Peak Flow --      Pain Score 09/10/18 2022 5     Pain Loc --      Pain Edu? --      Excl. in [place]? --    No data found.  Updated Vital Signs BP (!) 145/97 (BP Location: Left Arm)   Pulse 86   Temp 98 F (36.7  C) (Oral)   Resp 20   SpO2 95%      Physical Exam  Constitutional: He appears well-developed and well-nourished. No distress.  HENT:  Head: Normocephalic and atraumatic.  Mouth/Throat: Oropharynx is clear and moist.  Eyes: Pupils are equal, round, and reactive to light. Conjunctivae are normal.  Neck: Normal range of motion.  Cardiovascular: Normal rate, regular rhythm and normal heart sounds.  Pulmonary/Chest: Effort normal and breath sounds normal. No respiratory distress.  Abdominal: Soft. He exhibits no distension.  Musculoskeletal: Normal range of motion. He exhibits edema.  Right knee has effusion.  Tenderness around the medial joint line.  Full range of motion.  No instability.  No ecchymosis, bruising, wound  Neurological: He is alert.  Skin: Skin is warm and dry.  Psychiatric: He has a normal mood and affect. His behavior is normal.     UC Treatments / Results  Labs (all labs ordered are listed, but only abnormal results are displayed) Labs Reviewed - No data to display  EKG None  Radiology No results found.  Procedures Procedures (including critical care time)  Medications Ordered in UC Medications - No data to display  Initial Impression / Assessment and Plan / UC Course  I have reviewed the triage vital signs and the nursing notes.  Pertinent labs & imaging results that were available during my care of the patient were reviewed by me and considered in my medical decision making (see chart for details).    As above.  Medicines refilled.  I given patient 2 choices for PCP referral.  Short course of prednisone.  After this he can use over-the-counter ibuprofen.  Final Clinical Impressions(s) / UC Diagnoses   Final diagnoses:  Hypertension, unspecified type  Encounter for medication refill     Discharge Instructions     BP medicine are refilled Omeprazole refilled for the stomach Steroids - prednisone is prescribed for the knee pain I will give  you a name of a PCP   ED Prescriptions    Medication Sig Dispense Auth. Provider   amLODipine (NORVASC) 10 MG tablet Take 1 tablet (10 mg total) by mouth daily. 30 tablet Raylene Everts, MD   lisinopril (PRINIVIL,ZESTRIL) 20 MG tablet Take 1 tablet (20 mg total) by mouth daily. 30 tablet Raylene Everts, MD   omeprazole (PRILOSEC) 40 MG capsule Take 1 capsule (40 mg total) by mouth daily. 30 capsule Raylene Everts, MD   predniSONE (DELTASONE) 20 MG tablet Take 1 tablet (20 mg total) by mouth 2 (two) times  daily with a meal. 10 tablet Raylene Everts, MD     Controlled Substance Prescriptions [place] Controlled Substance Registry consulted? Not Applicable   Raylene Everts, MD 09/10/18 2054

## 2018-10-07 ENCOUNTER — Emergency Department (HOSPITAL_COMMUNITY)
Admission: EM | Admit: 2018-10-07 | Discharge: 2018-10-07 | Disposition: A | Discharge status: Home / Self Care | Payer: Medicaid Other | Attending: Emergency Medicine | Admitting: Emergency Medicine

## 2018-10-07 ENCOUNTER — Encounter (HOSPITAL_COMMUNITY): Payer: Self-pay | Admitting: Emergency Medicine

## 2018-10-07 ENCOUNTER — Emergency Department (HOSPITAL_COMMUNITY): Payer: Medicaid Other

## 2018-10-07 ENCOUNTER — Other Ambulatory Visit: Payer: Self-pay

## 2018-10-07 DIAGNOSIS — I10 Essential (primary) hypertension: Secondary | ICD-10-CM | POA: Insufficient documentation

## 2018-10-07 DIAGNOSIS — Z79899 Other long term (current) drug therapy: Secondary | ICD-10-CM | POA: Insufficient documentation

## 2018-10-07 DIAGNOSIS — M545 Low back pain, unspecified: Secondary | ICD-10-CM

## 2018-10-07 MED ORDER — NAPROXEN 375 MG PO TABS: 375.0000 mg | ORAL_TABLET | Freq: Two times a day (BID) | ORAL | 0 refills | Status: DC

## 2018-10-07 MED ORDER — KETOROLAC TROMETHAMINE 60 MG/2ML IM SOLN
60.0000 mg | Freq: Once | INTRAMUSCULAR | Status: AC
  Administered 2018-10-07: 60 mg via INTRAMUSCULAR
  Filled 2018-10-07: qty 2

## 2018-10-07 MED ORDER — CYCLOBENZAPRINE HCL 10 MG PO TABS: 10.0000 mg | ORAL_TABLET | Freq: Two times a day (BID) | ORAL | 0 refills | Status: DC | PRN

## 2018-10-07 NOTE — ED Notes (Signed)
Patient verbalizes understanding of discharge instructions. Opportunity for questioning and answers were provided. Armband removed by staff, pt discharged from ED ambulatory.   

## 2018-10-07 NOTE — ED Notes (Signed)
Patient transported to X-ray 

## 2018-10-07 NOTE — ED Provider Notes (Signed)
[facility] EMERGENCY DEPARTMENT Provider Note   CSN: [id number] Arrival date & time: 10/07/18  1832     History   Chief Complaint Chief Complaint  Patient presents with  . Back Pain  . Foreign Body in Skin    HPI Darrell Conway is a 52 y.o. male.  Patient states he slipped on the steps last night, landing on his lower back. He has been attempting heat packs and OTC medication without relief of his pain. He is able to ambulate, albeit slowly. No urinary or fecal incontinence.  The history is provided by the patient. No language interpreter was used.  Back Pain   This is a new problem. The current episode started yesterday. The problem occurs constantly. The problem has been gradually worsening. The pain is associated with falling. The pain is present in the lumbar spine. The quality of the pain is described as aching and shooting. The pain does not radiate. The pain is moderate. The symptoms are aggravated by certain positions. The pain is the same all the time. Pertinent negatives include no bowel incontinence, no perianal numbness, no bladder incontinence, no paresthesias and no weakness. He has tried heat and NSAIDs for the symptoms. The treatment provided no relief.    Past Medical History:  Diagnosis Date  . GERD (gastroesophageal reflux disease)   . Hypertension     There are no active problems to display for this patient.   History reviewed. No pertinent surgical history.      Home Medications    Prior to Admission medications   Medication Sig Start Date End Date Taking? Authorizing Provider  amLODipine (NORVASC) 10 MG tablet Take 1 tablet (10 mg total) by mouth daily. 09/10/18   Raylene Everts, MD  lisinopril (PRINIVIL,ZESTRIL) 20 MG tablet Take 1 tablet (20 mg total) by mouth daily. 09/10/18   Raylene Everts, MD  meloxicam (MOBIC) 15 MG tablet Take 1 tablet (15 mg total) by mouth daily. 05/29/18   Kirichenko, Tatyana, PA-C    methocarbamol (ROBAXIN) 500 MG tablet Take 1 tablet (500 mg total) by mouth 2 (two) times daily. 08/19/18   Jacqlyn Larsen, PA-C  Multiple Vitamins-Minerals (MENS MULTIVITAMIN PLUS PO) Take 1 tablet by mouth daily.    [provider]  omeprazole (PRILOSEC) 40 MG capsule Take 1 capsule (40 mg total) by mouth daily. 09/10/18   Raylene Everts, MD  predniSONE (DELTASONE) 20 MG tablet Take 1 tablet (20 mg total) by mouth 2 (two) times daily with a meal. 09/10/18   Raylene Everts, MD    Family History No family history on file.  Social History Social History   Tobacco Use  . Smoking status: Never Smoker  . Smokeless tobacco: Never Used  Substance Use Topics  . Alcohol use: No    Frequency: Never  . Drug use: No     Allergies   Patient has no known allergies.   Review of Systems Review of Systems  Gastrointestinal: Negative for bowel incontinence.  Genitourinary: Negative for bladder incontinence.  Musculoskeletal: Positive for back pain.  Neurological: Negative for weakness and paresthesias.  All other systems reviewed and are negative.    Physical Exam Updated Vital Signs There were no vitals taken for this visit.  Physical Exam  Constitutional: He is oriented to person, place, and time. He appears well-developed and well-nourished.  HENT:  Head: Atraumatic.  Eyes: Conjunctivae are normal.  Neck: Neck supple.  Cardiovascular: Normal rate and regular rhythm.  Pulmonary/Chest: Effort normal and breath sounds normal.  Abdominal: Soft. Bowel sounds are normal.  Musculoskeletal: He exhibits tenderness.       Lumbar back: He exhibits tenderness.       Back:  Tenderness to midline lower back with paraspinal spasm and tenderness.  Neurological: He is alert and oriented to person, place, and time.  Skin: Skin is warm and dry.  Psychiatric: He has a normal mood and affect.  Nursing note and vitals reviewed.    ED Treatments / Results  Labs (all labs  ordered are listed, but only abnormal results are displayed) Labs Reviewed - No data to display  EKG None  Radiology Dg Lumbar Spine Complete  Result Date: 10/07/2018 CLINICAL DATA:  Pain after fall today. EXAM: LUMBAR SPINE - COMPLETE 4+ VIEW COMPARISON:  None. FINDINGS: There is no evidence of lumbar spine fracture. Alignment is normal. Small Schmorl's node in the pelvic the superior endplate of L1. Slight disc space narrowing at L5-S1. No pars defects or listhesis. Intact sacroiliac joints bilaterally and pubic symphysis. Intact bilateral hip joints. IMPRESSION: No acute osseous abnormality of the lumbar spine. Electronically Signed   By: Ashley Royalty M.D.   On: 10/07/2018 21:16    Procedures Procedures (including critical care time)  Medications Ordered in ED Medications - No data to display   Initial Impression / Assessment and Plan / ED Course  I have reviewed the triage vital signs and the nursing notes.  Pertinent labs & imaging results that were available during my care of the patient were reviewed by me and considered in my medical decision making (see chart for details).    Patient with back pain.  No neurological deficits and normal neuro exam.  Patient is ambulatory.  No loss of bowel or bladder control.  No concern for cauda equina.  No fever, night sweats, weight loss, h/o cancer, IVDA, no recent procedure to back. No urinary symptoms suggestive of UTI.  Supportive care and return precaution discussed. Appears safe for discharge at this time. Follow up as indicated in discharge paperwork.   Final Clinical Impressions(s) / ED Diagnoses   Final diagnoses:  Acute bilateral low back pain without sciatica    ED Discharge Orders         Ordered    naproxen (NAPROSYN) 375 MG tablet  2 times daily     10/07/18 2212    cyclobenzaprine (FLEXERIL) 10 MG tablet  2 times daily PRN     10/07/18 2212           Etta Quill, NP 10/07/18 2216    [name],  MD 10/11/18 [phone]

## 2018-10-07 NOTE — ED Notes (Signed)
Patient ambulatory to bathroom with steady gait at this time 

## 2018-10-07 NOTE — ED Triage Notes (Signed)
Pt slipped on brick steps last night. He grabbed the wooden rail and got a splinter in his left hand. Pt also fell backwards and landed on his lower back. Pt states he did not hit his head. Pt tried OTC meds and heating packs with no success.

## 2018-11-28 ENCOUNTER — Ambulatory Visit: Payer: Self-pay | Admitting: Nurse Practitioner

## 2018-11-28 ENCOUNTER — Encounter: Payer: Self-pay | Admitting: Nurse Practitioner

## 2018-11-28 VITALS — BP 142/90 | HR 82 | Temp 98.6°F | Wt 304.8 lb

## 2018-11-28 DIAGNOSIS — M1000 Idiopathic gout, unspecified site: Secondary | ICD-10-CM

## 2018-11-28 DIAGNOSIS — Z760 Encounter for issue of repeat prescription: Secondary | ICD-10-CM

## 2018-11-28 DIAGNOSIS — I10 Essential (primary) hypertension: Secondary | ICD-10-CM

## 2018-11-28 MED ORDER — COLCHICINE 0.6 MG PO TABS: 0.6000 mg | ORAL_TABLET | Freq: Every day | ORAL | 0 refills | Status: DC

## 2018-11-28 MED ORDER — AMLODIPINE BESYLATE 10 MG PO TABS: 10.0000 mg | ORAL_TABLET | Freq: Every day | ORAL | 0 refills | Status: DC

## 2018-11-28 MED ORDER — LISINOPRIL 20 MG PO TABS: 20.0000 mg | ORAL_TABLET | Freq: Every day | ORAL | 0 refills | Status: DC

## 2018-11-28 MED ORDER — COLCHICINE 0.6 MG PO TABS: 0.6000 mg | ORAL_TABLET | Freq: Two times a day (BID) | ORAL | 0 refills | Status: DC

## 2018-11-28 NOTE — Progress Notes (Addendum)
Subjective:    Patient ID: Darrell Conway, male    DOB: [birth year], 53 y.o.   MRN: [id number]  The patient is a 53 year old male who presents today for medication refills.  The patient currently takes medications for hypertension and gout.  States he has been out of his medications for the last 2 days.  The patient does complain of a headache today.  Patient denies fever, chills, change in vision, blurry vision, lower extremity swelling, palpitations, chest pain.  Patient also states he has been previously diagnosed with RA, and continues to have joint pain and swelling.  Patient states his joint pain and swelling are worse at night and in the morning.  Patient denies having a good diet or exercising.  States he does not have a regular PCP.  Patient also informs that he has previously been diagnosed with sleep apnea and is "borderline" diabetic due to his weight.  The patient states he takes lisinopril and Norvasc for his hypertension.  The patient is unsure of the medication that he takes for gout.  Hypertension  The current episode started more than 1 year ago. The problem is unchanged. Associated symptoms include headaches and peripheral edema. Pertinent negatives include no blurred vision, chest pain, palpitations or shortness of breath. There are no associated agents to hypertension. Risk factors for coronary artery disease include diabetes mellitus, dyslipidemia and sedentary lifestyle. Past treatments include ACE inhibitors and calcium channel blockers. The current treatment provides moderate improvement. Compliance problems include diet and exercise.  Identifiable causes of hypertension include sleep apnea.    I have reviewed the patient's past medical history, current medications, and allergies.  Review of Systems  Constitutional: Negative.   HENT: Negative.   Eyes: Negative.  Negative for blurred vision.  Respiratory: Negative for shortness of breath.   Cardiovascular: Negative for  chest pain and palpitations.  Gastrointestinal: Negative.   Endocrine: Negative.   Musculoskeletal: Positive for joint swelling.       +joint stiffness  Skin: Negative.   Allergic/Immunologic: Negative.   Neurological: Positive for headaches.       Objective:   Physical Exam Constitutional:      Appearance: Normal appearance. He is obese.  HENT:     Head: Normocephalic and atraumatic.     Right Ear: Tympanic membrane and ear canal normal.     Left Ear: Tympanic membrane and ear canal normal.     Nose: Nose normal.     Mouth/Throat:     Mouth: Mucous membranes are moist.  Eyes:     Extraocular Movements: Extraocular movements intact.     Conjunctiva/sclera: Conjunctivae normal.     Pupils: Pupils are equal, round, and reactive to light.  Neck:     Musculoskeletal: Normal range of motion and neck supple. No neck rigidity or muscular tenderness.  Cardiovascular:     Rate and Rhythm: Normal rate and regular rhythm.     Pulses: Normal pulses.     Heart sounds: Normal heart sounds.  Pulmonary:     Effort: Pulmonary effort is normal.     Breath sounds: Normal breath sounds.  Abdominal:     General: Abdomen is flat.     Tenderness: There is no abdominal tenderness.  Skin:    General: Skin is warm and dry.     Capillary Refill: Capillary refill takes 2 to 3 seconds.  Neurological:     General: No focal deficit present.     Mental Status: He is alert and oriented to  person, place, and time.     Cranial Nerves: No cranial nerve deficit.  Psychiatric:        Mood and Affect: Mood normal.        Thought Content: Thought content normal.       Assessment & Plan:   Exam findings, diagnosis etiology and medication use and indications reviewed with patient. Follow- Up and discharge instructions provided. No emergent/urgent issues found on exam.  We will go ahead and fill the patient's hypertensive medications today for 30 days.  Patient will call my office back to let me know which  medicine he takes for gout and I refill it at that time.  We will give the patient information to help him establish PCP.  Patient also encouraged to consider a specialist for his RA.  In the interim, discussed diet and exercise modifications to help the patient with his blood pressure.  We will also provide the patient education on managing his hypertension.  Patient education was provided. Patient verbalized understanding of information provided and agrees with plan of care (POC), all questions answered. The patient is advised to call or return to clinic if condition does not see an improvement in symptoms, or to seek the care of the closest emergency department if condition worsens with the above plan.   1. Medication refill  - lisinopril (PRINIVIL,ZESTRIL) 20 MG tablet; Take 1 tablet (20 mg total) by mouth daily.  Dispense: 30 tablet; Refill: 0 - amLODipine (NORVASC) 10 MG tablet; Take 1 tablet (10 mg total) by mouth daily.  Dispense: 30 tablet; Refill: 0  2. Essential hypertension  - lisinopril (PRINIVIL,ZESTRIL) 20 MG tablet; Take 1 tablet (20 mg total) by mouth daily.  Dispense: 30 tablet; Refill: 0 - amLODipine (NORVASC) 10 MG tablet; Take 1 tablet (10 mg total) by mouth daily.  Dispense: 30 tablet; Refill: 0 -Take medications as prescribed. -Consider beginning exercising and making diet modifications.  These changes will be beneficial to helping control or manage her blood pressure. -Follow-up with 1 of the offices provided to establish primary care. -Once you establish primary care, you will need lab work, a complete physical, a sleep study, and possible referrals for management of your RA. -Follow-up in the emergency department sooner if you develop difficulty breathing, shortness of breath, palpitations, chest pain, swelling of your lower extremities, worsening headache, worsening blurred vision or other concerns.   3. Idiopathic gout, unspecified chronicity, unspecified site  -  colchicine 0.6 MG tablet; Take 1 tablet (0.6 mg total) by mouth twice daily.  Dispense: 60 tablet; Refill: 0

## 2018-11-28 NOTE — Addendum Note (Signed)
Addended by: Cari Caraway on: 11/28/2018 03:43 PM   Modules accepted: Orders

## 2018-11-28 NOTE — Patient Instructions (Addendum)
Hypertension  -Take medications as prescribed. -Consider beginning exercising and making diet modifications.  These changes will be beneficial to helping control or manage her blood pressure. -Follow-up with 1 of the offices provided to establish primary care. -Once you establish primary care, you will need lab work, a complete physical, a sleep study, and possible referrals for management of your RA. -Follow-up in the emergency department sooner if you develop difficulty breathing, shortness of breath, palpitations, chest pain, swelling of your lower extremities, worsening headache, worsening blurred vision or other concerns.   Hypertension, commonly called high blood pressure, is when the force of blood pumping through the arteries is too strong. The arteries are the blood vessels that carry blood from the heart throughout the body. Hypertension forces the heart to work harder to pump blood and may cause arteries to become narrow or stiff. Having untreated or uncontrolled hypertension can cause heart attacks, strokes, kidney disease, and other problems. A blood pressure reading consists of a higher number over a lower number. Ideally, your blood pressure should be below 120/80. The first ("top") number is called the systolic pressure. It is a measure of the pressure in your arteries as your heart beats. The second ("bottom") number is called the diastolic pressure. It is a measure of the pressure in your arteries as the heart relaxes. What are the causes? The cause of this condition is not known. What increases the risk? Some risk factors for high blood pressure are under your control. Others are not. Factors you can change  Smoking.  Having type 2 diabetes mellitus, high cholesterol, or both.  Not getting enough exercise or physical activity.  Being overweight.  Having too much fat, sugar, calories, or salt (sodium) in your diet.  Drinking too much alcohol. Factors that are difficult  or impossible to change  Having chronic kidney disease.  Having a family history of high blood pressure.  Age. Risk increases with age.  Race. You may be at higher risk if you are African-American.  Gender. Men are at higher risk than women before age 30. After age 48, women are at higher risk than men.  Having obstructive sleep apnea.  Stress. What are the signs or symptoms? Extremely high blood pressure (hypertensive crisis) may cause:  Headache.  Anxiety.  Shortness of breath.  Nosebleed.  Nausea and vomiting.  Severe chest pain.  Jerky movements you cannot control (seizures). How is this diagnosed? This condition is diagnosed by measuring your blood pressure while you are seated, with your arm resting on a surface. The cuff of the blood pressure monitor will be placed directly against the skin of your upper arm at the level of your heart. It should be measured at least twice using the same arm. Certain conditions can cause a difference in blood pressure between your right and left arms. Certain factors can cause blood pressure readings to be lower or higher than normal (elevated) for a short period of time:  When your blood pressure is higher when you are in a health care provider's office than when you are at home, this is called white coat hypertension. Most people with this condition do not need medicines.  When your blood pressure is higher at home than when you are in a health care provider's office, this is called masked hypertension. Most people with this condition may need medicines to control blood pressure. If you have a high blood pressure reading during one visit or you have normal blood pressure with other  risk factors:  You may be asked to return on a different day to have your blood pressure checked again.  You may be asked to monitor your blood pressure at home for 1 week or longer. If you are diagnosed with hypertension, you may have other blood or  imaging tests to help your health care provider understand your overall risk for other conditions. How is this treated? This condition is treated by making healthy lifestyle changes, such as eating healthy foods, exercising more, and reducing your alcohol intake. Your health care provider may prescribe medicine if lifestyle changes are not enough to get your blood pressure under control, and if:  Your systolic blood pressure is above 130.  Your diastolic blood pressure is above 80. Your personal target blood pressure may vary depending on your medical conditions, your age, and other factors. Follow these instructions at home: Eating and drinking   Eat a diet that is high in fiber and potassium, and low in sodium, added sugar, and fat. An example eating plan is called the DASH (Dietary Approaches to Stop Hypertension) diet. To eat this way: ? Eat plenty of fresh fruits and vegetables. Try to fill half of your plate at each meal with fruits and vegetables. ? Eat whole grains, such as whole wheat pasta, brown rice, or whole grain bread. Fill about one quarter of your plate with whole grains. ? Eat or drink low-fat dairy products, such as skim milk or low-fat yogurt. ? Avoid fatty cuts of meat, processed or cured meats, and poultry with skin. Fill about one quarter of your plate with lean proteins, such as fish, chicken without skin, beans, eggs, and tofu. ? Avoid premade and processed foods. These tend to be higher in sodium, added sugar, and fat.  Reduce your daily sodium intake. Most people with hypertension should eat less than 1,500 mg of sodium a day.  Limit alcohol intake to no more than 1 drink a day for nonpregnant women and 2 drinks a day for men. One drink equals 12 oz of beer, 5 oz of wine, or 1 oz of hard liquor. Lifestyle   Work with your health care provider to maintain a healthy body weight or to lose weight. Ask what an ideal weight is for you.  Get at least 30 minutes of  exercise that causes your heart to beat faster (aerobic exercise) most days of the week. Activities may include walking, swimming, or biking.  Include exercise to strengthen your muscles (resistance exercise), such as pilates or lifting weights, as part of your weekly exercise routine. Try to do these types of exercises for 30 minutes at least 3 days a week.  Do not use any products that contain nicotine or tobacco, such as cigarettes and e-cigarettes. If you need help quitting, ask your health care provider.  Monitor your blood pressure at home as told by your health care provider.  Keep all follow-up visits as told by your health care provider. This is important. Medicines  Take over-the-counter and prescription medicines only as told by your health care provider. Follow directions carefully. Blood pressure medicines must be taken as prescribed.  Do not skip doses of blood pressure medicine. Doing this puts you at risk for problems and can make the medicine less effective.  Ask your health care provider about side effects or reactions to medicines that you should watch for. Contact a health care provider if:  You think you are having a reaction to a medicine you are taking.  You have headaches that keep coming back (recurring).  You feel dizzy.  You have swelling in your ankles.  You have trouble with your vision. Get help right away if:  You develop a severe headache or confusion.  You have unusual weakness or numbness.  You feel faint.  You have severe pain in your chest or abdomen.  You vomit repeatedly.  You have trouble breathing. Summary  Hypertension is when the force of blood pumping through your arteries is too strong. If this condition is not controlled, it may put you at risk for serious complications.  Your personal target blood pressure may vary depending on your medical conditions, your age, and other factors. For most people, a normal blood pressure is  less than 120/80.  Hypertension is treated with lifestyle changes, medicines, or a combination of both. Lifestyle changes include weight loss, eating a healthy, low-sodium diet, exercising more, and limiting alcohol. This information is not intended to replace advice given to you by your health care provider. Make sure you discuss any questions you have with your health care provider. Document Released: 11/10/2005 Document Revised: 10/08/2016 Document Reviewed: 10/08/2016 Elsevier Interactive Patient Education  2019 [employer].  Managing Your Hypertension Hypertension is commonly called high blood pressure. This is when the force of your blood pressing against the walls of your arteries is too strong. Arteries are blood vessels that carry blood from your heart throughout your body. Hypertension forces the heart to work harder to pump blood, and may cause the arteries to become narrow or stiff. Having untreated or uncontrolled hypertension can cause heart attack, stroke, kidney disease, and other problems. What are blood pressure readings? A blood pressure reading consists of a higher number over a lower number. Ideally, your blood pressure should be below 120/80. The first ("top") number is called the systolic pressure. It is a measure of the pressure in your arteries as your heart beats. The second ("bottom") number is called the diastolic pressure. It is a measure of the pressure in your arteries as the heart relaxes. What does my blood pressure reading mean? Blood pressure is classified into four stages. Based on your blood pressure reading, your health care provider may use the following stages to determine what type of treatment you need, if any. Systolic pressure and diastolic pressure are measured in a unit called mm Hg. Normal  Systolic pressure: below 875.  Diastolic pressure: below 80. Elevated  Systolic pressure: 643-329.  Diastolic pressure: below 80. Hypertension stage  1  Systolic pressure: 518-841.  Diastolic pressure: 66-06. Hypertension stage 2  Systolic pressure: 301 or above.  Diastolic pressure: 90 or above. What health risks are associated with hypertension? Managing your hypertension is an important responsibility. Uncontrolled hypertension can lead to:  A heart attack.  A stroke.  A weakened blood vessel (aneurysm).  Heart failure.  Kidney damage.  Eye damage.  Metabolic syndrome.  Memory and concentration problems. What changes can I make to manage my hypertension? Hypertension can be managed by making lifestyle changes and possibly by taking medicines. Your health care provider will help you make a plan to bring your blood pressure within a normal range. Eating and drinking   Eat a diet that is high in fiber and potassium, and low in salt (sodium), added sugar, and fat. An example eating plan is called the DASH (Dietary Approaches to Stop Hypertension) diet. To eat this way: ? Eat plenty of fresh fruits and vegetables. Try to fill half of your plate  at each meal with fruits and vegetables. ? Eat whole grains, such as whole wheat pasta, brown rice, or whole grain bread. Fill about one quarter of your plate with whole grains. ? Eat low-fat diary products. ? Avoid fatty cuts of meat, processed or cured meats, and poultry with skin. Fill about one quarter of your plate with lean proteins such as fish, chicken without skin, beans, eggs, and tofu. ? Avoid premade and processed foods. These tend to be higher in sodium, added sugar, and fat.  Reduce your daily sodium intake. Most people with hypertension should eat less than 1,500 mg of sodium a day.  Limit alcohol intake to no more than 1 drink a day for nonpregnant women and 2 drinks a day for men. One drink equals 12 oz of beer, 5 oz of wine, or 1 oz of hard liquor. Lifestyle  Work with your health care provider to maintain a healthy body weight, or to lose weight. Ask what an  ideal weight is for you.  Get at least 30 minutes of exercise that causes your heart to beat faster (aerobic exercise) most days of the week. Activities may include walking, swimming, or biking.  Include exercise to strengthen your muscles (resistance exercise), such as weight lifting, as part of your weekly exercise routine. Try to do these types of exercises for 30 minutes at least 3 days a week.  Do not use any products that contain nicotine or tobacco, such as cigarettes and e-cigarettes. If you need help quitting, ask your health care provider.  Control any long-term (chronic) conditions you have, such as high cholesterol or diabetes. Monitoring  Monitor your blood pressure at home as told by your health care provider. Your personal target blood pressure may vary depending on your medical conditions, your age, and other factors.  Have your blood pressure checked regularly, as often as told by your health care provider. Working with your health care provider  Review all the medicines you take with your health care provider because there may be side effects or interactions.  Talk with your health care provider about your diet, exercise habits, and other lifestyle factors that may be contributing to hypertension.  Visit your health care provider regularly. Your health care provider can help you create and adjust your plan for managing hypertension. Will I need medicine to control my blood pressure? Your health care provider may prescribe medicine if lifestyle changes are not enough to get your blood pressure under control, and if:  Your systolic blood pressure is 130 or higher.  Your diastolic blood pressure is 80 or higher. Take medicines only as told by your health care provider. Follow the directions carefully. Blood pressure medicines must be taken as prescribed. The medicine does not work as well when you skip doses. Skipping doses also puts you at risk for problems. Contact a  health care provider if:  You think you are having a reaction to medicines you have taken.  You have repeated (recurrent) headaches.  You feel dizzy.  You have swelling in your ankles.  You have trouble with your vision. Get help right away if:  You develop a severe headache or confusion.  You have unusual weakness or numbness, or you feel faint.  You have severe pain in your chest or abdomen.  You vomit repeatedly.  You have trouble breathing. Summary  Hypertension is when the force of blood pumping through your arteries is too strong. If this condition is not controlled, it may put  you at risk for serious complications.  Your personal target blood pressure may vary depending on your medical conditions, your age, and other factors. For most people, a normal blood pressure is less than 120/80.  Hypertension is managed by lifestyle changes, medicines, or both. Lifestyle changes include weight loss, eating a healthy, low-sodium diet, exercising more, and limiting alcohol. This information is not intended to replace advice given to you by your health care provider. Make sure you discuss any questions you have with your health care provider. Document Released: 08/04/2012 Document Revised: 10/08/2016 Document Reviewed: 10/08/2016 Elsevier Interactive Patient Education  2019 [employer]  Gout  Gout is a condition that causes painful swelling of the joints. Gout is a type of inflammation of the joints (arthritis). This condition is caused by having too much uric acid in the body. Uric acid is a chemical that forms when the body breaks down substances called purines. Purines are important for building body proteins. When the body has too much uric acid, sharp crystals can form and build up inside the joints. This causes pain and swelling. Gout attacks can happen quickly and may be very painful (acute gout). Over time, the attacks can affect more joints and become more frequent (chronic  gout). Gout can also cause uric acid to build up under the skin and inside the kidneys. What are the causes? This condition is caused by too much uric acid in your blood. This can happen because:  Your kidneys do not remove enough uric acid from your blood. This is the most common cause.  Your body makes too much uric acid. This can happen with some cancers and cancer treatments. It can also occur if your body is breaking down too many red blood cells (hemolytic anemia).  You eat too many foods that are high in purines. These foods include organ meats and some seafood. Alcohol, especially beer, is also high in purines. A gout attack may be triggered by trauma or stress. What increases the risk? You are more likely to develop this condition if you:  Have a family history of gout.  Are male and middle-aged.  Are male and have gone through menopause.  Are obese.  Frequently drink alcohol, especially beer.  Are dehydrated.  Lose weight too quickly.  Have an organ transplant.  Have lead poisoning.  Take certain medicines, including aspirin, cyclosporine, diuretics, levodopa, and niacin.  Have kidney disease.  Have a skin condition called psoriasis. What are the signs or symptoms? An attack of acute gout happens quickly. It usually occurs in just one joint. The most common place is the big toe. Attacks often start at night. Other joints that may be affected include joints of the feet, ankle, knee, fingers, wrist, or elbow. Symptoms of this condition may include:  Severe pain.  Warmth.  Swelling.  Stiffness.  Tenderness. The affected joint may be very painful to touch.  Shiny, red, or purple skin.  Chills and fever. Chronic gout may cause symptoms more frequently. More joints may be involved. You may also have white or yellow lumps (tophi) on your hands or feet or in other areas near your joints. How is this diagnosed? This condition is diagnosed based on your  symptoms, medical history, and physical exam. You may have tests, such as:  Blood tests to measure uric acid levels.  Removal of joint fluid with a thin needle (aspiration) to look for uric acid crystals.  X-rays to look for joint damage. How is this treated?  Treatment for this condition has two phases: treating an acute attack and preventing future attacks. Acute gout treatment may include medicines to reduce pain and swelling, including:  NSAIDs.  Steroids. These are strong anti-inflammatory medicines that can be taken by mouth (orally) or injected into a joint.  Colchicine. This medicine relieves pain and swelling when it is taken soon after an attack. It can be given by mouth or through an IV. Preventive treatment may include:  Daily use of smaller doses of NSAIDs or colchicine.  Use of a medicine that reduces uric acid levels in your blood.  Changes to your diet. You may need to see a dietitian about what to eat and drink to prevent gout. Follow these instructions at home: During a gout attack   If directed, put ice on the affected area: ? Put ice in a plastic bag. ? Place a towel between your skin and the bag. ? Leave the ice on for 20 minutes, 2-3 times a day.  Raise (elevate) the affected joint above the level of your heart as often as possible.  Rest the joint as much as possible. If the affected joint is in your leg, you may be given crutches to use.  Follow instructions from your health care provider about eating or drinking restrictions. Avoiding future gout attacks  Follow a low-purine diet as told by your dietitian or health care provider. Avoid foods and drinks that are high in purines, including liver, kidney, anchovies, asparagus, herring, mushrooms, mussels, and beer.  Maintain a healthy weight or lose weight if you are overweight. If you want to lose weight, talk with your health care provider. It is important that you do not lose weight too  quickly.  Start or maintain an exercise program as told by your health care provider. Eating and drinking  Drink enough fluids to keep your urine pale yellow.  If you drink alcohol: ? Limit how much you use to:  0-1 drink a day for women.  0-2 drinks a day for men. ? Be aware of how much alcohol is in your drink. In the [place] one drink equals one 12 oz bottle of beer (355 mL) one 5 oz glass of wine (148 mL), or one 1 oz glass of hard liquor (44 mL). General instructions  Take over-the-counter and prescription medicines only as told by your health care provider.  Do not drive or use heavy machinery while taking prescription pain medicine.  Return to your normal activities as told by your health care provider. Ask your health care provider what activities are safe for you.  Keep all follow-up visits as told by your health care provider. This is important. Contact a health care provider if you have:  Another gout attack.  Continuing symptoms of a gout attack after 10 days of treatment.  Side effects from your medicines.  Chills or a fever.  Burning pain when you urinate.  Pain in your lower back or belly. Get help right away if you:  Have severe or uncontrolled pain.  Cannot urinate. Summary  Gout is painful swelling of the joints caused by inflammation.  The most common site of pain is the big toe, but it can affect other joints in the body.  Medicines and dietary changes can help to prevent and treat gout attacks. This information is not intended to replace advice given to you by your health care provider. Make sure you discuss any questions you have with your health care provider. Document Released: 11/07/2000  Document Revised: 06/02/2018 Document Reviewed: 06/02/2018 Elsevier Interactive Patient Education  [employer].

## 2018-12-12 ENCOUNTER — Other Ambulatory Visit: Payer: Self-pay | Admitting: Nurse Practitioner

## 2018-12-20 ENCOUNTER — Other Ambulatory Visit: Payer: Self-pay | Admitting: Nurse Practitioner

## 2018-12-20 DIAGNOSIS — Z760 Encounter for issue of repeat prescription: Secondary | ICD-10-CM

## 2018-12-20 DIAGNOSIS — I10 Essential (primary) hypertension: Secondary | ICD-10-CM

## 2019-01-03 ENCOUNTER — Encounter: Payer: Self-pay | Admitting: Family Medicine

## 2019-01-04 ENCOUNTER — Ambulatory Visit: Payer: Medicaid Other | Admitting: Family Medicine

## 2019-01-06 ENCOUNTER — Encounter (HOSPITAL_COMMUNITY): Payer: Self-pay

## 2019-01-06 ENCOUNTER — Emergency Department (HOSPITAL_COMMUNITY): Payer: Worker's Compensation

## 2019-01-06 ENCOUNTER — Other Ambulatory Visit: Payer: Self-pay

## 2019-01-06 ENCOUNTER — Emergency Department (HOSPITAL_COMMUNITY)
Admission: EM | Admit: 2019-01-06 | Discharge: 2019-01-06 | Disposition: A | Discharge status: Home / Self Care | Payer: Worker's Compensation | Attending: Emergency Medicine | Admitting: Emergency Medicine

## 2019-01-06 DIAGNOSIS — I10 Essential (primary) hypertension: Secondary | ICD-10-CM | POA: Insufficient documentation

## 2019-01-06 DIAGNOSIS — Z79899 Other long term (current) drug therapy: Secondary | ICD-10-CM | POA: Insufficient documentation

## 2019-01-06 DIAGNOSIS — Z87891 Personal history of nicotine dependence: Secondary | ICD-10-CM | POA: Diagnosis not present

## 2019-01-06 DIAGNOSIS — R1030 Lower abdominal pain, unspecified: Secondary | ICD-10-CM | POA: Diagnosis not present

## 2019-01-06 DIAGNOSIS — R1032 Left lower quadrant pain: Secondary | ICD-10-CM

## 2019-01-06 LAB — BASIC METABOLIC PANEL
ANION GAP: 7 (ref 5–15)
BUN: 13 mg/dL (ref 6–20)
CALCIUM: 9.6 mg/dL (ref 8.9–10.3)
CO2: 26 mmol/L (ref 22–32)
Chloride: 105 mmol/L (ref 98–111)
Creatinine, Ser: 0.77 mg/dL (ref 0.61–1.24)
Glucose, Bld: 103 mg/dL — ABNORMAL HIGH (ref 70–99)
POTASSIUM: 3.5 mmol/L (ref 3.5–5.1)
Sodium: 138 mmol/L (ref 135–145)

## 2019-01-06 LAB — CBC WITH DIFFERENTIAL/PLATELET
ABS IMMATURE GRANULOCYTES: 0.03 10*3/uL (ref 0.00–0.07)
BASOS ABS: 0 10*3/uL (ref 0.0–0.1)
Basophils Relative: 1 %
EOS ABS: 0.2 10*3/uL (ref 0.0–0.5)
Eosinophils Relative: 3 %
HEMATOCRIT: 46.4 % (ref 39.0–52.0)
Hemoglobin: 15.5 g/dL (ref 13.0–17.0)
IMMATURE GRANULOCYTES: 0 %
Lymphocytes Relative: 32 %
Lymphs Abs: 2.2 10*3/uL (ref 0.7–4.0)
MCH: 28.3 pg (ref 26.0–34.0)
MCHC: 33.4 g/dL (ref 30.0–36.0)
MCV: 84.8 fL (ref 80.0–100.0)
MONO ABS: 0.6 10*3/uL (ref 0.1–1.0)
Monocytes Relative: 10 %
NEUTROS ABS: 3.6 10*3/uL (ref 1.7–7.7)
NRBC: 0 % (ref 0.0–0.2)
Neutrophils Relative %: 54 %
PLATELETS: 258 10*3/uL (ref 150–400)
RBC: 5.47 MIL/uL (ref 4.22–5.81)
RDW: 13.2 % (ref 11.5–15.5)
WBC: 6.7 10*3/uL (ref 4.0–10.5)

## 2019-01-06 LAB — URINALYSIS, ROUTINE W REFLEX MICROSCOPIC
BILIRUBIN URINE: NEGATIVE
Glucose, UA: NEGATIVE mg/dL
Hgb urine dipstick: NEGATIVE
KETONES UR: NEGATIVE mg/dL
Leukocytes,Ua: NEGATIVE
NITRITE: NEGATIVE
Protein, ur: NEGATIVE mg/dL
Specific Gravity, Urine: 1.017 (ref 1.005–1.030)
pH: 5 (ref 5.0–8.0)

## 2019-01-06 LAB — LACTIC ACID, PLASMA: Lactic Acid, Venous: 1.1 mmol/L (ref 0.5–1.9)

## 2019-01-06 MED ORDER — MELOXICAM 15 MG PO TABS: 15.0000 mg | ORAL_TABLET | Freq: Every day | ORAL | 0 refills | Status: DC

## 2019-01-06 MED ORDER — OXYCODONE-ACETAMINOPHEN 5-325 MG PO TABS
1.0000 | ORAL_TABLET | Freq: Once | ORAL | Status: AC
  Administered 2019-01-06: 1 via ORAL
  Filled 2019-01-06: qty 1

## 2019-01-06 MED ORDER — SODIUM CHLORIDE (PF) 0.9 % IJ SOLN
INTRAMUSCULAR | Status: AC
  Filled 2019-01-06: qty 50

## 2019-01-06 MED ORDER — IOPAMIDOL (ISOVUE-300) INJECTION 61%
INTRAVENOUS | Status: AC
  Filled 2019-01-06: qty 100

## 2019-01-06 MED ORDER — IOHEXOL 300 MG/ML  SOLN
100.0000 mL | Freq: Once | INTRAMUSCULAR | Status: AC | PRN
  Administered 2019-01-06: 100 mL via INTRAVENOUS

## 2019-01-06 MED ORDER — ONDANSETRON 4 MG PO TBDP
4.0000 mg | ORAL_TABLET | Freq: Once | ORAL | Status: AC
  Administered 2019-01-06: 4 mg via ORAL
  Filled 2019-01-06: qty 1

## 2019-01-06 NOTE — Discharge Instructions (Signed)
Please see the information and instructions below regarding your visit.  Your diagnoses today include:  1. Left inguinal pain   2. Elevated blood pressure reading in office with diagnosis of hypertension     Tests performed today include: See side panel of your discharge paperwork for testing performed today. Vital signs are listed at the bottom of these instructions.   Medications prescribed:    Take any prescribed medications only as prescribed, and any over the counter medications only as directed on the packaging.  You are prescribed Mobic, a non-steroidal anti-inflammatory agent (NSAID) for pain. You may take 15mg  every 24 hours as needed for pain. If still requiring this medication around the clock for acute pain after 10 days, please see your primary healthcare provider.  Women who are pregnant, breastfeeding, or planning on becoming pregnant should not take non-steroidal anti-inflammatories such as Advil and Aleve. Tylenol is a safe over the counter pain reliever in pregnant women.  You may combine this medication with Tylenol, 650 mg every 6 hours, so you are receiving something for pain every 3 hours.  This is not a long-term medication unless under the care and direction of your primary provider. Taking this medication long-term and not under the supervision of a healthcare provider could increase the risk of stomach ulcers, kidney problems, and cardiovascular problems such as high blood pressure.    Home care instructions:  Please follow any educational materials contained in this packet.   Please apply ice to the area that is bothersome.  Please ice for 20 minutes on, 20 minutes off.  Please use the ice pack that we gave you so as not to cause any superficial nerve damage on the skin.  Follow-up instructions: Please follow-up with your primary care provider as soon as possible for further evaluation of your symptoms if they are not completely improved.   Please follow up  with Dr. Griffin Basil of orthopedics/sports medicine.   Return instructions:  Please return to the Emergency Department if you experience worsening symptoms.  Please return to the emergency department if you develop any worsening pain, inability to urinate, trouble ambulating, or any new or worsening symptoms. Please return if you have any other emergent concerns.  Additional Information: \  Your vital signs today were: BP (!) 143/82 (BP Location: Left Arm)    Pulse 80    Temp 98.3 F (36.8 C) (Oral)    Resp 20    Ht 5\' 11"  (1.803 m)    Wt 136.1 kg    SpO2 96%    BMI 41.84 kg/m  If your blood pressure (BP) was elevated on multiple readings during this visit above 130 for the top number or above 80 for the bottom number, please have this repeated by your primary care provider within one month. --------------  Thank you for allowing [place] to participate in your care today.

## 2019-01-06 NOTE — ED Triage Notes (Signed)
Pt states he was at work and was bending over.  Felt "pop" in left inguinal area.  Pt pain radiating down groin.  Painful to touch.  Sent here from urgent care.  No hx of hernia.

## 2019-01-06 NOTE — ED Provider Notes (Signed)
[facility] DEPT Provider Note   CSN: [id number] Arrival date & time: 01/06/19  1202     History   Chief Complaint Chief Complaint  Patient presents with  . Groin Pain    HPI Darrell Conway is a 53 y.o. male.  HPI  Patient is a 53 year old male with a history of anemia, GERD, gout, hypertension rheumatoid throat is presenting for sudden onset groin pain.  He reports it occurred as he was standing up from lifting a box.  He reports he felt a "pop" in his groin.  He reports that he feels a bulge in the suprapubic left side inguinal region.  Patient denies any history of hernias.  He denies any history of nephrolithiasis.  He denies any dysuria, urgency, frequency or penile discharge.  Patient has any nausea or vomiting.  Patient reports that he does not have any other abdominal pain besides the inguinal pain.  Patient was seen in urgent care prior to arrival, however they sent him to the emergency department for further evaluation for hernia.  Past Medical History:  Diagnosis Date  . Anemia   . GERD (gastroesophageal reflux disease)   . Gout   . Hypertension   . Rheumatoid arthritis ([place])     There are no active problems to display for this patient.   Past Surgical History:  Procedure Laterality Date  . HERNIA REPAIR          Home Medications    Prior to Admission medications   Medication Sig Start Date End Date Taking? Authorizing Provider  amLODipine (NORVASC) 10 MG tablet Take 1 tablet (10 mg total) by mouth daily. 11/28/18 01/06/19 Yes Kara Dies, NP  lisinopril (PRINIVIL,ZESTRIL) 20 MG tablet Take 1 tablet (20 mg total) by mouth daily. 11/28/18 01/06/19 Yes Kara Dies, NP  Multiple Vitamins-Minerals (MENS MULTIVITAMIN PLUS PO) Take 1 tablet by mouth daily.   Yes [provider]  colchicine 0.6 MG tablet Take 1 tablet (0.6 mg total) by mouth 2 (two) times daily. 11/28/18 12/28/18  Kara Dies, NP    omeprazole (PRILOSEC) 40 MG capsule Take 1 capsule (40 mg total) by mouth daily. Patient not taking: Reported on 01/06/2019 09/10/18   Raylene Everts, MD    Family History History reviewed. No pertinent family history.  Social History Social History   Tobacco Use  . Smoking status: Former Research scientist (life sciences)  . Smokeless tobacco: Never Used  Substance Use Topics  . Alcohol use: Yes    Frequency: Never  . Drug use: No     Allergies   Patient has no known allergies.   Review of Systems Review of Systems  Constitutional: Negative for chills and fever.  HENT: Negative for congestion, rhinorrhea, sinus pain and sore throat.   Eyes: Negative for visual disturbance.  Respiratory: Negative for cough, chest tightness and shortness of breath.   Cardiovascular: Negative for chest pain, palpitations and leg swelling.  Gastrointestinal: Negative for abdominal pain, nausea and vomiting.  Genitourinary: Negative for difficulty urinating, dysuria, flank pain and testicular pain.       +Groin pain  Musculoskeletal: Negative for back pain and myalgias.  Skin: Negative for rash.  Neurological: Negative for dizziness, syncope, light-headedness and headaches.     Physical Exam Updated Vital Signs BP 133/86   Pulse 82   Temp 98.3 F (36.8 C) (Oral)   Resp 18   Ht 5\' 11"  (1.803 m)   Wt 136.1 kg   SpO2 100%  BMI 41.84 kg/m   Physical Exam Vitals signs and nursing note reviewed.  Constitutional:      General: He is not in acute distress.    Appearance: He is well-developed.  HENT:     Head: Normocephalic and atraumatic.     Mouth/Throat:     Mouth: Mucous membranes are moist.  Eyes:     Conjunctiva/sclera: Conjunctivae normal.     Pupils: Pupils are equal, round, and reactive to light.  Neck:     Musculoskeletal: Normal range of motion and neck supple.  Cardiovascular:     Rate and Rhythm: Normal rate and regular rhythm.     Heart sounds: S1 normal and S2 normal. No murmur.   Pulmonary:     Effort: Pulmonary effort is normal.     Breath sounds: Normal breath sounds. No wheezing or rales.  Abdominal:     General: There is no distension.     Palpations: Abdomen is soft.     Tenderness: There is no abdominal tenderness. There is no guarding.  Genitourinary:    Comments: Hernia examination performed with nurse tech chaperone present.  Patient has no palpable bulge of abdominal wall.  Examination of indirect hernias limited due to patient tolerance, however no palpable bulge felt into the scrotum on Valsalva maneuver. Musculoskeletal: Normal range of motion.        General: No deformity.  Lymphadenopathy:     Cervical: No cervical adenopathy.  Skin:    General: Skin is warm and dry.     Findings: No erythema or rash.  Neurological:     Mental Status: He is alert.     Comments: Cranial nerves grossly intact. Patient moves extremities symmetrically and with good coordination.  Psychiatric:        Behavior: Behavior normal.        Thought Content: Thought content normal.        Judgment: Judgment normal.      ED Treatments / Results  Labs (all labs ordered are listed, but only abnormal results are displayed) Labs Reviewed  BASIC METABOLIC PANEL - Abnormal; Notable for the following components:      Result Value   Glucose, Bld 103 (*)    All other components within normal limits  CBC WITH DIFFERENTIAL/PLATELET  LACTIC ACID, PLASMA  URINALYSIS, ROUTINE W REFLEX MICROSCOPIC  LACTIC ACID, PLASMA    EKG None  Radiology Ct Abdomen Pelvis W Contrast  Result Date: 01/06/2019 CLINICAL DATA:  Right groin pain after bending over at work. EXAM: CT ABDOMEN AND PELVIS WITH CONTRAST TECHNIQUE: Multidetector CT imaging of the abdomen and pelvis was performed using the standard protocol following bolus administration of intravenous contrast. CONTRAST:  155mL OMNIPAQUE IOHEXOL 300 MG/ML  SOLN COMPARISON:  None. FINDINGS: Lower chest: No acute abnormality.  Hepatobiliary: No focal liver abnormality is seen. No gallstones, gallbladder wall thickening, or biliary dilatation. Pancreas: Unremarkable. No pancreatic ductal dilatation or surrounding inflammatory changes. Spleen: Normal in size without focal abnormality. Adrenals/Urinary Tract: Adrenal glands appear normal. Right parapelvic cyst is noted. No hydronephrosis or renal obstruction is noted. No renal or ureteral calculi are noted. Urinary bladder is unremarkable. Stomach/Bowel: Stomach is within normal limits. Appendix appears normal. No evidence of bowel wall thickening, distention, or inflammatory changes. Vascular/Lymphatic: No significant vascular findings are present. No enlarged abdominal or pelvic lymph nodes. Reproductive: Prostate is unremarkable. Other: No abdominal wall hernia or abnormality. No abdominopelvic ascites. Musculoskeletal: No acute or significant osseous findings. IMPRESSION: No significant abnormality seen in  the abdomen or pelvis. Electronically Signed   By: Marijo Conception, M.D.   On: 01/06/2019 18:19    Procedures Procedures (including critical care time)  Medications Ordered in ED Medications  oxyCODONE-acetaminophen (PERCOCET/ROXICET) 5-325 MG per tablet 1 tablet (1 tablet Oral Given 01/06/19 1429)  ondansetron (ZOFRAN-ODT) disintegrating tablet 4 mg (4 mg Oral Given 01/06/19 1429)     Initial Impression / Assessment and Plan / ED Course  I have reviewed the triage vital signs and the nursing notes.  Pertinent labs & imaging results that were available during my care of the patient were reviewed by me and considered in my medical decision making (see chart for details).  Clinical Course as of Jan 06 1846  Thu Jan 06, 2019  1711 Reassessed.  Reports pain is returning.   [AM]    Clinical Course User Index [AM] Albesa Seen, PA-C    Patient is nontoxic-appearing, afebrile, and in no acute distress peer differential diagnosis includes direct or indirect hernia,  rupture of tendon of either hip flexor or adductors.  Will obtain labs, lactic acid to assess for possible strangulated hernia, and obtain CT abdomen and pelvis.  CT abdomen and pelvis demonstrating no evidence of hernia.  No hematoma seen within the groin to suggest tendon rupture.  Suspect strain.  Will refer patient to orthopedics in the event that he may need further imaging or work-up.  Patient be treated with NSAIDs.  Return precautions given for any increasing pain, inability to urinate, or ambulate.  Patient is in understanding and agrees with plan of care.  Final Clinical Impressions(s) / ED Diagnoses   Final diagnoses:  Left inguinal pain  Elevated blood pressure reading in office with diagnosis of hypertension    ED Discharge Orders         Ordered    meloxicam (MOBIC) 15 MG tablet  Daily     01/06/19 1905           Tamala Julian 01/06/19 Deborah Chalk, MD 01/07/19 1123

## 2019-02-11 ENCOUNTER — Telehealth: Payer: Self-pay | Admitting: General Practice

## 2019-02-11 DIAGNOSIS — Z760 Encounter for issue of repeat prescription: Secondary | ICD-10-CM

## 2019-02-11 DIAGNOSIS — I10 Essential (primary) hypertension: Secondary | ICD-10-CM

## 2019-02-11 NOTE — Telephone Encounter (Signed)
Patient is requesting refills on amlodipine and lisinopril, patient has never seen PCP and we had to cancel his appointment. [facility] on [address].

## 2019-02-14 ENCOUNTER — Ambulatory Visit: Payer: Medicaid Other | Admitting: Family Medicine

## 2019-02-14 MED ORDER — LISINOPRIL 20 MG PO TABS: 20.0000 mg | ORAL_TABLET | Freq: Every day | ORAL | 0 refills | Status: DC

## 2019-02-14 MED ORDER — AMLODIPINE BESYLATE 10 MG PO TABS: 10.0000 mg | ORAL_TABLET | Freq: Every day | ORAL | 0 refills | Status: DC

## 2019-02-14 NOTE — Telephone Encounter (Signed)
He would need to refer to previous PCP for refills if available since at this time he we are under restriction. I will forward to Maudie Mercury to validate.

## 2019-02-14 NOTE — Telephone Encounter (Signed)
Contacted patient, scheduled appointment.

## 2019-02-14 NOTE — Telephone Encounter (Signed)
I have fairly recent labs for him. I will fill medication for 6 weeks only. Could we make sure patient has a six week follow-up scheduled. He will not receive any additional refills.

## 2019-02-24 ENCOUNTER — Ambulatory Visit
Admission: EM | Admit: 2019-02-24 | Discharge: 2019-02-24 | Disposition: A | Discharge status: Home / Self Care | Payer: BLUE CROSS/BLUE SHIELD | Attending: Physician Assistant | Admitting: Physician Assistant

## 2019-02-24 DIAGNOSIS — K0889 Other specified disorders of teeth and supporting structures: Secondary | ICD-10-CM | POA: Diagnosis not present

## 2019-02-24 MED ORDER — AMOXICILLIN-POT CLAVULANATE 875-125 MG PO TABS: 1.0000 | ORAL_TABLET | Freq: Two times a day (BID) | ORAL | 0 refills | Status: DC

## 2019-02-24 MED ORDER — MELOXICAM 15 MG PO TABS: 15.0000 mg | ORAL_TABLET | Freq: Every day | ORAL | 0 refills | Status: DC

## 2019-02-24 MED ORDER — HYDROCODONE-ACETAMINOPHEN 5-325 MG PO TABS: 1.0000 | ORAL_TABLET | Freq: Four times a day (QID) | ORAL | 0 refills | Status: DC | PRN

## 2019-02-24 NOTE — ED Triage Notes (Signed)
Pt c/o rt upper and lower tooth ache x2 days, states has 2 missing fillings. States also needs a mobic refill. States his appt with dentist and PCP was cancelled

## 2019-02-24 NOTE — Discharge Instructions (Signed)
Start Augmentin as directed for dental infection. Start Mobic. Do not take ibuprofen (motrin/advil)/ naproxen (aleve) while on mobic. Norco for breakthrough pain. Follow up with dentist for further treatment and evaluation. If experiencing swelling of the throat, trouble breathing, trouble swallowing, leaning forward to breath, drooling, go to the emergency department for further evaluation.

## 2019-02-24 NOTE — ED Provider Notes (Signed)
[facility]    CSN: [id number] Arrival date & time: 02/24/19  1004     History   Chief Complaint Chief Complaint  Patient presents with  . Dental Pain    HPI Darrell Conway is a 53 y.o. male.   53 year old male comes in for 2-day history of dental pain.  States he has right upper and left upper tooth pain, causing throbbing sensation.  Has 2 missing fillings, had seen a dentist for cleaning, and had appointment scheduled for filling, but was canceled.  States pain has been intermittent for the past few months, but has been more constant for the past 2 days.  He denies swelling of the face, trouble breathing, trouble swallowing, swelling of the throat, tripoding, drooling.  Denies fever, chills, night sweats.  Patient had first requested refill of Mobic, but then states this is for his arthritis, and he needed something stronger for his teeth.     Past Medical History:  Diagnosis Date  . Anemia   . GERD (gastroesophageal reflux disease)   . Gout   . Hypertension   . Rheumatoid arthritis ([place])     There are no active problems to display for this patient.   Past Surgical History:  Procedure Laterality Date  . HERNIA REPAIR         Home Medications    Prior to Admission medications   Medication Sig Start Date End Date Taking? Authorizing Provider  amLODipine (NORVASC) 10 MG tablet Take 1 tablet (10 mg total) by mouth daily. 02/14/19 03/28/19  Scot Jun, FNP  amoxicillin-clavulanate (AUGMENTIN) 875-125 MG tablet Take 1 tablet by mouth every 12 (twelve) hours. 02/24/19   Tasia Catchings, Amy V, PA-C  colchicine 0.6 MG tablet Take 1 tablet (0.6 mg total) by mouth 2 (two) times daily. 11/28/18 12/28/18  Kara Dies, NP  HYDROcodone-acetaminophen (NORCO/VICODIN) 5-325 MG tablet Take 1 tablet by mouth every 6 (six) hours as needed for severe pain. 02/24/19   Tasia Catchings, Amy V, PA-C  lisinopril (PRINIVIL,ZESTRIL) 20 MG tablet Take 1 tablet (20 mg total) by mouth daily.  02/14/19 03/28/19  Scot Jun, FNP  meloxicam (MOBIC) 15 MG tablet Take 1 tablet (15 mg total) by mouth daily. 02/24/19   Tasia Catchings, Amy V, PA-C  Multiple Vitamins-Minerals (MENS MULTIVITAMIN PLUS PO) Take 1 tablet by mouth daily.    [provider]  omeprazole (PRILOSEC) 40 MG capsule Take 1 capsule (40 mg total) by mouth daily. Patient not taking: Reported on 01/06/2019 09/10/18   Raylene Everts, MD    Family History No family history on file.  Social History Social History   Tobacco Use  . Smoking status: Former Research scientist (life sciences)  . Smokeless tobacco: Never Used  Substance Use Topics  . Alcohol use: Yes    Frequency: Never  . Drug use: No     Allergies   Patient has no known allergies.   Review of Systems Review of Systems  Reason unable to perform ROS: See HPI as above.     Physical Exam Triage Vital Signs ED Triage Vitals [02/24/19 1017]  Enc Vitals Group     BP (!) 143/102     Pulse Rate 94     Resp 18     Temp 97.7 F (36.5 C)     Temp Source Oral     SpO2 96 %     Weight      Height      Head Circumference  Peak Flow      Pain Score 10     Pain Loc      Pain Edu?      Excl. in [place]?    No data found.  Updated Vital Signs BP (!) 143/102 (BP Location: Left Arm)   Pulse 94   Temp 97.7 F (36.5 C) (Oral)   Resp 18   SpO2 96%   Physical Exam Constitutional:      General: He is not in acute distress.    Appearance: He is well-developed. He is not ill-appearing, toxic-appearing or diaphoretic.  HENT:     Head: Normocephalic and atraumatic.     Jaw: No trismus.     Mouth/Throat:     Mouth: Mucous membranes are moist.     Pharynx: Oropharynx is clear. Uvula midline. No uvula swelling.     Tonsils: No tonsillar exudate.     Comments: Patient flinching/jumping prior to palpation.  Difficult to tell actual location of tenderness.  However, patient with gum swelling and erythema to the right upper and left upper molar area.  Floor of mouth soft to  palpation. No facial swelling.  Neck:     Musculoskeletal: Normal range of motion and neck supple.  Skin:    General: Skin is warm and dry.  Neurological:     Mental Status: He is alert and oriented to person, place, and time.      UC Treatments / Results  Labs (all labs ordered are listed, but only abnormal results are displayed) Labs Reviewed - No data to display  EKG None  Radiology No results found.  Procedures Procedures (including critical care time)  Medications Ordered in UC Medications - No data to display  Initial Impression / Assessment and Plan / UC Course  I have reviewed the triage vital signs and the nursing notes.  Pertinent labs & imaging results that were available during my care of the patient were reviewed by me and considered in my medical decision making (see chart for details).    Start antibiotics for possible dental infection. Symptomatic treatment as needed. Discussed with patient symptoms can return if dental problem is not addressed. Follow up with dentist for further evaluation and treatment of dental pain. Resources given. Return precautions given.   Final Clinical Impressions(s) / UC Diagnoses   Final diagnoses:  Pain, dental    ED Prescriptions    Medication Sig Dispense Auth. Provider   meloxicam (MOBIC) 15 MG tablet Take 1 tablet (15 mg total) by mouth daily. 30 tablet Yu, Amy V, PA-C   HYDROcodone-acetaminophen (NORCO/VICODIN) 5-325 MG tablet Take 1 tablet by mouth every 6 (six) hours as needed for severe pain. 10 tablet Yu, Amy V, PA-C   amoxicillin-clavulanate (AUGMENTIN) 875-125 MG tablet Take 1 tablet by mouth every 12 (twelve) hours. 14 tablet Cathlean Sauer V, PA-C     Controlled Substance Prescriptions [place] Controlled Substance Registry consulted? Yes, I have consulted the [place] Controlled Substances Registry for this patient, and feel the risk/benefit ratio today is favorable for proceeding with this prescription for a controlled  substance.   Ok Edwards, PA-C 02/24/19 1042

## 2019-03-21 ENCOUNTER — Ambulatory Visit (INDEPENDENT_AMBULATORY_CARE_PROVIDER_SITE_OTHER): Payer: BLUE CROSS/BLUE SHIELD | Admitting: Family Medicine

## 2019-03-21 ENCOUNTER — Other Ambulatory Visit: Payer: Self-pay

## 2019-03-21 ENCOUNTER — Encounter: Payer: Self-pay | Admitting: Family Medicine

## 2019-03-21 DIAGNOSIS — M25561 Pain in right knee: Secondary | ICD-10-CM | POA: Diagnosis not present

## 2019-03-21 DIAGNOSIS — G4733 Obstructive sleep apnea (adult) (pediatric): Secondary | ICD-10-CM

## 2019-03-21 DIAGNOSIS — K2100 Gastro-esophageal reflux disease with esophagitis, without bleeding: Secondary | ICD-10-CM

## 2019-03-21 DIAGNOSIS — K222 Esophageal obstruction: Secondary | ICD-10-CM

## 2019-03-21 DIAGNOSIS — M25562 Pain in left knee: Secondary | ICD-10-CM

## 2019-03-21 DIAGNOSIS — M25552 Pain in left hip: Secondary | ICD-10-CM | POA: Diagnosis not present

## 2019-03-21 DIAGNOSIS — I10 Essential (primary) hypertension: Secondary | ICD-10-CM

## 2019-03-21 DIAGNOSIS — M25551 Pain in right hip: Secondary | ICD-10-CM

## 2019-03-21 DIAGNOSIS — G8929 Other chronic pain: Secondary | ICD-10-CM

## 2019-03-21 MED ORDER — OMEPRAZOLE 40 MG PO CPDR: 40.0000 mg | DELAYED_RELEASE_CAPSULE | Freq: Every day | ORAL | 3 refills | Status: DC

## 2019-03-21 MED ORDER — MELOXICAM 15 MG PO TABS: 15.0000 mg | ORAL_TABLET | Freq: Every day | ORAL | 3 refills | Status: DC

## 2019-03-21 MED ORDER — GABAPENTIN 300 MG PO CAPS: 300.0000 mg | ORAL_CAPSULE | Freq: Every evening | ORAL | 3 refills | Status: DC | PRN

## 2019-03-21 MED ORDER — LISINOPRIL 20 MG PO TABS: 20.0000 mg | ORAL_TABLET | Freq: Every day | ORAL | 11 refills | Status: DC

## 2019-03-21 NOTE — Progress Notes (Deleted)
Has chronic  Bilateral knee pain and hip pain.  Makes a popping noise.  Sometimes makes it difficult to start walking.  Does not use a cane or walker.   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  Pains are worsening throughout  the years.  Hard to relax or sleep  - Years ago he was supposed to have a CPAP Needs to have a sleep study done. Has added more pounds from driving trucks.   Mouth pain from previous dental visit

## 2019-03-21 NOTE — Progress Notes (Signed)
Virtual Visit via Telephone Note  I connected with Darrell Conway on 03/21/19 at  9:50 AM EDT by telephone and verified that I am speaking with the correct person using two identifiers.   I discussed the limitations, risks, security and privacy concerns of performing an evaluation and management service by telephone and the availability of in person appointments. I also discussed with the patient that there may be a patient responsible charge related to this service. The patient expressed understanding and agreed to proceed.  Provider located at Primary Care office.  History of Present Illness: Darrell Conway is establishing to day as a new patient. Darrell Conway's medical history is significant for hypertension, OSA, chronic bilateral knee and hip pain, esophageal stricture, chronic pain, and hyperlipidemia. Darrell Conway has been lost to routine primary medical care for more than 1 year. The last office he was followed by a primary care/urgent care/ pain medication provider. In the past he has been evaluated by orthopedics, although he reports this was back in 2018. He is uncertain of what his official diagnosis was concerning the underlying cause of hip and knee pain.   Hypertension  Gloyd reports no home monitoring of blood pressure. His initial new patient was appointment was rescheduled due to [place]. He was prescribed a short course of lisinopril in order to allow him to establish care for today's visit. Reports adherence to blood pressure medications. At some point, he was prescribed both amlodipine and lisinopril, however he disliked how taking both medication caused him to feel and is currently taking lisinopril only. He is inactive of routine physical activity and current weight exceeds 300 lbs. He is a former smoker. Makes efforts to reduce sodium.  Knee pain (bilateral) Knee pain, right is worst than left. He endorses crepitus and feels as if the bones are rubbing against each other. Pain is characterized as  aching. Precipitating by walking. Right knee swells often. He is also experiencing bilateral hip pain. Hip pain characterized as aching to sharp pain. Sharp pain of the hips occurs mostly at night which interferes with sleep. No recent falls. He is taking Mobic only for pain without significant improvement. Reports in past followed by pain management and prefers to be referred back to pain management.  OSA Reports a diagnosis of OSA approximately 6 years ago and was prescribed CPAP. He felt his overall health improved and stopped using machine. He continues to experiencing apneic episodes. His blood pressure over the last year has remained poorly controlled. He is requesting a referral to sleep medicine.   GERD/Esophageal Stricture  Patient is requesting an ENT referral. He was previously followed by [facility] for management of esophageal stricture. He underwent a esophageal dilation a few years ago which improve symptoms of dysphagia, however he now experiencing symptoms of foods getting stuck in throat. History of GERD. He is currently without omeprazole. Previously prescribed omeprazole 40 mg once daily. Would like to restart omeprazole.    Social History   Socioeconomic History  . Marital status: Married    Spouse name: Not on file  . Number of children: Not on file  . Years of education: Not on file  . Highest education level: Not on file  Occupational History  . Not on file  Social Needs  . Financial resource strain: Not on file  . Food insecurity:    Worry: Not on file    Inability: Not on file  . Transportation needs:    Medical: Not on file    Non-medical: Not  on file  Tobacco Use  . Smoking status: Former Research scientist (life sciences)  . Smokeless tobacco: Never Used  Substance and Sexual Activity  . Alcohol use: Yes    Frequency: Never  . Drug use: No  . Sexual activity: Not on file  Lifestyle  . Physical activity:    Days per week: Not on file    Minutes per session: Not on file  .  Stress: Not on file  Relationships  . Social connections:    Talks on phone: Not on file    Gets together: Not on file    Attends religious service: Not on file    Active member of club or organization: Not on file    Attends meetings of clubs or organizations: Not on file    Relationship status: Not on file  . Intimate partner violence:    Fear of current or ex partner: Not on file    Emotionally abused: Not on file    Physically abused: Not on file    Forced sexual activity: Not on file  Other Topics Concern  . Not on file  Social History Narrative  . Not on file    Assessment and Plan: 1. Essential hypertension, uncertain of control BP check 1 week and fasting labs Will make adjustments to include either adding amlodipine back to regimen or consider adding  Beta blocker or nifedipine   2. Bilateral hip pain 3. Chronic pain of both knees Referral placed to orthopedic surgery Continue Meloxicam Trial Gabapentin 300 mg at PRN at bedtime Referral placed to pain management   4. OSA (obstructive sleep apnea) -Split night sleep study ordered  5. Stricture and stenosis of esophagus -Referral placed to ENT   6. Gastroesophageal reflux disease with esophagitis -Resume omeprazole 40 mg once daily -Highly encouraged avoidance of foods that worsen GERD symptoms   Follow Up Instructions: 1 week labs and BP check     I discussed the assessment and treatment plan with the patient. The patient was provided an opportunity to ask questions and all were answered. The patient agreed with the plan and demonstrated an understanding of the instructions.   The patient was advised to call back or seek an in-person evaluation if the symptoms worsen or if the condition fails to improve as anticipated.  I provided 25 minutes of non-face-to-face time during this encounter.   Molli Barrows, FNP

## 2019-03-24 ENCOUNTER — Ambulatory Visit (INDEPENDENT_AMBULATORY_CARE_PROVIDER_SITE_OTHER): Payer: Self-pay

## 2019-03-24 ENCOUNTER — Telehealth: Payer: Self-pay

## 2019-03-24 ENCOUNTER — Other Ambulatory Visit: Payer: Self-pay

## 2019-03-24 ENCOUNTER — Encounter (INDEPENDENT_AMBULATORY_CARE_PROVIDER_SITE_OTHER): Payer: Self-pay | Admitting: Orthopaedic Surgery

## 2019-03-24 ENCOUNTER — Ambulatory Visit (INDEPENDENT_AMBULATORY_CARE_PROVIDER_SITE_OTHER): Payer: BLUE CROSS/BLUE SHIELD | Admitting: Orthopaedic Surgery

## 2019-03-24 VITALS — BP 152/85 | HR 77 | Ht 72.0 in | Wt 300.0 lb

## 2019-03-24 DIAGNOSIS — M1712 Unilateral primary osteoarthritis, left knee: Secondary | ICD-10-CM | POA: Diagnosis not present

## 2019-03-24 DIAGNOSIS — G8929 Other chronic pain: Secondary | ICD-10-CM

## 2019-03-24 DIAGNOSIS — M25562 Pain in left knee: Secondary | ICD-10-CM

## 2019-03-24 DIAGNOSIS — M25552 Pain in left hip: Secondary | ICD-10-CM

## 2019-03-24 DIAGNOSIS — M25561 Pain in right knee: Secondary | ICD-10-CM | POA: Diagnosis not present

## 2019-03-24 DIAGNOSIS — M1711 Unilateral primary osteoarthritis, right knee: Secondary | ICD-10-CM

## 2019-03-24 DIAGNOSIS — M160 Bilateral primary osteoarthritis of hip: Secondary | ICD-10-CM | POA: Diagnosis not present

## 2019-03-24 DIAGNOSIS — M25551 Pain in right hip: Secondary | ICD-10-CM

## 2019-03-24 MED ORDER — BUPIVACAINE HCL 0.25 % IJ SOLN
2.0000 mL | INTRAMUSCULAR | Status: AC | PRN
  Administered 2019-03-24: 2 mL via INTRA_ARTICULAR

## 2019-03-24 MED ORDER — LIDOCAINE HCL 1 % IJ SOLN
2.0000 mL | INTRAMUSCULAR | Status: AC | PRN
  Administered 2019-03-24: 2 mL

## 2019-03-24 MED ORDER — METHYLPREDNISOLONE ACETATE 40 MG/ML IJ SUSP
80.0000 mg | INTRAMUSCULAR | Status: AC | PRN
  Administered 2019-03-24: 80 mg via INTRA_ARTICULAR

## 2019-03-24 NOTE — Progress Notes (Addendum)
Office Visit Note   Patient: Darrell Conway           Date of Birth: 06-18-1966           MRN: [id number] Visit Date: 03/24/2019              Requested by: Scot Jun, [address] [place], [place] [address] PCP: Scot Jun, FNP   Assessment & Plan: Visit Diagnoses:  1. Unilateral primary osteoarthritis, right knee   2. Unilateral primary osteoarthritis, left knee   3. Primary osteoarthritis of both hips   4. Chronic pain of right knee   5. Chronic pain of left knee   6. Bilateral hip pain   7. Pain of both hip joints     Plan:  #1: Corticosteroid injection to the right knee was performed. #2: We will schedule for an MRI scan of the pelvis looking at the hips for AVN  Follow-Up Instructions: Return for review of mri.   Face-to-face time spent with patient was greater than 45 minutes.  Greater than 50% of the time was spent in counseling and coordination of care.  Orders:  Orders Placed This Encounter  Procedures  . Large Joint Inj: R knee  . XR KNEE 3 VIEW LEFT  . XR KNEE 3 VIEW RIGHT  . XR HIPS BILAT W OR W/O PELVIS 3-4 VIEWS  . MR Pelvis w/o contrast   Meds ordered this encounter  Medications  . bupivacaine (MARCAINE) 0.25 % (with pres) injection 2 mL  . lidocaine (XYLOCAINE) 1 % (with pres) injection 2 mL  . methylPREDNISolone acetate (DEPO-MEDROL) injection 80 mg      Procedures: Large Joint Inj: R knee on 03/24/2019 11:15 AM Indications: pain and diagnostic evaluation Details: 25 G 1.5 in needle, anteromedial approach  Arthrogram: No  Medications: 2 mL lidocaine 1 %; 80 mg methylPREDNISolone acetate 40 MG/ML; 2 mL bupivacaine 0.25 % Procedure, treatment alternatives, risks and benefits explained, specific risks discussed. Consent was given by the patient. Immediately prior to procedure a time out was called to verify the correct patient, procedure, equipment, support staff and site/side marked as required. Patient was prepped  and draped in the usual sterile fashion.       Clinical Data: No additional findings.   Subjective: Chief Complaint  Patient presents with  . Left Knee - Pain  . Right Knee - Pain  . Right Hip - Pain  . Left Hip - Pain   HPI: Patient presents today for bilateral knee and bilateral hip pain. He states that his knees have bothered him for years. The right side is worse than the left. Both knees hurt anteriorly and swell. He said that his knees pop. He has tried cortisone injections in the past, but overtime they stopped being effective.   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   Patient states that both hips have hurt for about 20months. He said that the right side is worse than the left. He has a difficult time sleeping. He said that his pain is located on the lateral sides. No pain in his groin. He saw his PCP and was given Gabapentin to take. He states that sometimes he has pain that radiates down to his calf. He has no numbness or tingling in his lower extremities. He had lower back x-rays on 10/07/2018.  HPI  Review of Systems  Constitutional: Positive for fatigue.  HENT: Negative for ear pain.   Eyes: Negative for  pain.  Respiratory: Negative for shortness of breath.   Cardiovascular: Negative for leg swelling.  Gastrointestinal: Negative for constipation and diarrhea.  Endocrine: Negative for cold intolerance and heat intolerance.  Genitourinary: Negative for difficulty urinating.  Musculoskeletal: Positive for joint swelling.  Skin: Negative for rash.  Allergic/Immunologic: Negative for food allergies.  Neurological: Negative for weakness.  Hematological: Does not bruise/bleed easily.  Psychiatric/Behavioral: Positive for sleep disturbance.     Objective: Vital Signs: BP (!) 152/85   Pulse 77   Ht 6' (1.829 m)   Wt 300 lb (136.1 kg)   BMI 40.69 kg/m   Physical Exam Constitutional:      Appearance: Normal appearance. He is well-developed. He is  obese.  HENT:     Head: Normocephalic.  Eyes:     Pupils: Pupils are equal, round, and reactive to light.  Pulmonary:     Effort: Pulmonary effort is normal.  Skin:    General: Skin is warm and dry.  Neurological:     Mental Status: He is alert and oriented to person, place, and time.  Psychiatric:        Mood and Affect: Mood normal.        Behavior: Behavior normal.        Thought Content: Thought content normal.        Judgment: Judgment normal.     Ortho Exam  Exam both hips reveals decreased internal and external rotation of the hips bilateral equal.  He does have pain at at both extremes.  He has about 25 to 30 degrees of in external rotation.  10 degrees internal rotation.  Knees reveal mild effusion bilaterally.  Range of motion from full extension to 105 degrees bilateral.  Tender to palpation over the medial joint lines.  Supple nontender.  Neurovascular intact distally.  Specialty Comments:  No specialty comments available.  Imaging: No results found.   PMFS History: Current Outpatient Medications  Medication Sig Dispense Refill  . amLODipine (NORVASC) 10 MG tablet Take 1 tablet (10 mg total) by mouth daily. 42 tablet 0  . gabapentin (NEURONTIN) 300 MG capsule Take 1 capsule (300 mg total) by mouth at bedtime as needed and may repeat dose one time if needed. 90 capsule 3  . lisinopril (ZESTRIL) 20 MG tablet Take 1 tablet (20 mg total) by mouth daily. 30 tablet 11  . meloxicam (MOBIC) 15 MG tablet Take 1 tablet (15 mg total) by mouth daily. 30 tablet 3  . Multiple Vitamins-Minerals (MENS MULTIVITAMIN PLUS PO) Take 1 tablet by mouth daily.    Marland Kitchen omeprazole (PRILOSEC) 40 MG capsule Take 1 capsule (40 mg total) by mouth daily. 30 capsule 3  . atorvastatin (LIPITOR) 40 MG tablet Take 1 tablet (40 mg total) by mouth daily. 90 tablet 3  . HYDROcodone-acetaminophen (NORCO/VICODIN) 5-325 MG tablet Take 1 tablet by mouth every 6 (six) hours as needed for moderate pain. 30  tablet 0  . traMADol (ULTRAM) 50 MG tablet Take 1 tablet (50 mg total) by mouth every 6 (six) hours as needed (tid prn as needed pain). 30 tablet 0   No current facility-administered medications for this visit.     There are no active problems to display for this patient.  Past Medical History:  Diagnosis Date  . Anemia   . GERD (gastroesophageal reflux disease)   . Gout   . Hypertension   . Rheumatoid arthritis ([place])     Family History  Family history unknown: Yes  Past Surgical History:  Procedure Laterality Date  . HERNIA REPAIR     Social History   Occupational History  . Not on file  Tobacco Use  . Smoking status: Former Research scientist (life sciences)  . Smokeless tobacco: Never Used  Substance and Sexual Activity  . Alcohol use: Yes    Frequency: Never  . Drug use: No  . Sexual activity: Not on file

## 2019-03-24 NOTE — Telephone Encounter (Signed)
Called patient to do their pre-visit COVID screening.  Have you recently traveled internationally([place], [place], [place], [place], [place]) or within the [place] to a hotspot area([place], [place], [place], [place], [place])? no  Are you currently experiencing any of the following: fever, cough, SHOB, fatigue? no  Have you been in contact with anyone who has recently travelled? no  Have you been in contact with anyone who is experiencing fever, cough, SHOB, fatigue or been diagnosed with COVID  or works in or has recently visited a SNF? no  

## 2019-03-28 ENCOUNTER — Other Ambulatory Visit: Payer: Self-pay | Admitting: Family Medicine

## 2019-03-28 ENCOUNTER — Other Ambulatory Visit: Payer: Self-pay | Admitting: *Deleted

## 2019-03-28 ENCOUNTER — Ambulatory Visit (INDEPENDENT_AMBULATORY_CARE_PROVIDER_SITE_OTHER): Payer: BLUE CROSS/BLUE SHIELD

## 2019-03-28 ENCOUNTER — Telehealth: Payer: Self-pay | Admitting: Orthopaedic Surgery

## 2019-03-28 ENCOUNTER — Other Ambulatory Visit: Payer: Self-pay

## 2019-03-28 DIAGNOSIS — I10 Essential (primary) hypertension: Secondary | ICD-10-CM

## 2019-03-28 DIAGNOSIS — M25561 Pain in right knee: Secondary | ICD-10-CM

## 2019-03-28 DIAGNOSIS — Z130 Encounter for screening for diseases of the blood and blood-forming organs and certain disorders involving the immune mechanism: Secondary | ICD-10-CM

## 2019-03-28 DIAGNOSIS — Z0130 Encounter for examination of blood pressure without abnormal findings: Secondary | ICD-10-CM

## 2019-03-28 DIAGNOSIS — Z6841 Body Mass Index (BMI) 40.0 and over, adult: Secondary | ICD-10-CM

## 2019-03-28 DIAGNOSIS — Z125 Encounter for screening for malignant neoplasm of prostate: Secondary | ICD-10-CM

## 2019-03-28 DIAGNOSIS — G8929 Other chronic pain: Secondary | ICD-10-CM

## 2019-03-28 DIAGNOSIS — M25562 Pain in left knee: Secondary | ICD-10-CM

## 2019-03-28 MED ORDER — TRAMADOL HCL 50 MG PO TABS: 50.0000 mg | ORAL_TABLET | Freq: Four times a day (QID) | ORAL | 0 refills | Status: DC | PRN

## 2019-03-28 NOTE — Progress Notes (Signed)
Patient here for BP check & fasting labs. Patient has taken BP medication today. Patient did not bring BP cuff with him to visit today. Vitals listed below. Spoke with provider & she states to have patient follow up in 2 months to see her in office. KWalker, CMA.  Vitals:   03/28/19 1002  BP: 135/88  Pulse: 84  Resp: 17  Temp: 98.6 F (37 C)  SpO2: 97%

## 2019-03-28 NOTE — Telephone Encounter (Signed)
I called patient, Tramadol 50 mg # 30 RF 0 TID prn pain approved by Dr. Durward Fortes, called in.

## 2019-03-28 NOTE — Telephone Encounter (Signed)
Patient got an injection in his rt knee. Patient states injection was put in Lt side of knee, now rt side of knee has a sharp pain in it. Patient is limping some what due to pain. Patient is scheduled for MRI on 05/06/19 of his pelvis. Patient request something to help with knee pain. Patient uses Paediatric nurse on [facility].

## 2019-03-29 LAB — COMPREHENSIVE METABOLIC PANEL
ALT: 40 IU/L (ref 0–44)
AST: 26 IU/L (ref 0–40)
Albumin/Globulin Ratio: 1.6 (ref 1.2–2.2)
Albumin: 4.5 g/dL (ref 3.8–4.9)
Alkaline Phosphatase: 89 IU/L (ref 39–117)
BUN/Creatinine Ratio: 15 (ref 9–20)
BUN: 11 mg/dL (ref 6–24)
Bilirubin Total: 0.4 mg/dL (ref 0.0–1.2)
CO2: 20 mmol/L (ref 20–29)
Calcium: 9.6 mg/dL (ref 8.7–10.2)
Chloride: 107 mmol/L — ABNORMAL HIGH (ref 96–106)
Creatinine, Ser: 0.73 mg/dL — ABNORMAL LOW (ref 0.76–1.27)
GFR calc Af Amer: 124 mL/min/{1.73_m2} (ref >59)
GFR calc non Af Amer: 107 mL/min/{1.73_m2} (ref >59)
Globulin, Total: 2.9 g/dL (ref 1.5–4.5)
Glucose: 101 mg/dL — ABNORMAL HIGH (ref 65–99)
Potassium: 4.1 mmol/L (ref 3.5–5.2)
Sodium: 142 mmol/L (ref 134–144)
Total Protein: 7.4 g/dL (ref 6.0–8.5)

## 2019-03-29 LAB — CBC WITH DIFFERENTIAL/PLATELET
Basophils Absolute: 0 10*3/uL (ref 0.0–0.2)
Basos: 0 %
EOS (ABSOLUTE): 0.1 10*3/uL (ref 0.0–0.4)
Eos: 2 %
Hematocrit: 43.2 % (ref 37.5–51.0)
Hemoglobin: 14.9 g/dL (ref 13.0–17.7)
Immature Grans (Abs): 0 10*3/uL (ref 0.0–0.1)
Immature Granulocytes: 0 %
Lymphocytes Absolute: 2.4 10*3/uL (ref 0.7–3.1)
Lymphs: 35 %
MCH: 29.3 pg (ref 26.6–33.0)
MCHC: 34.5 g/dL (ref 31.5–35.7)
MCV: 85 fL (ref 79–97)
Monocytes Absolute: 0.6 10*3/uL (ref 0.1–0.9)
Monocytes: 9 %
Neutrophils Absolute: 3.6 10*3/uL (ref 1.4–7.0)
Neutrophils: 54 %
Platelets: 262 10*3/uL (ref 150–450)
RBC: 5.09 x10E6/uL (ref 4.14–5.80)
RDW: 14.6 % (ref 11.6–15.4)
WBC: 6.9 10*3/uL (ref 3.4–10.8)

## 2019-03-29 LAB — HEMOGLOBIN A1C
Est. average glucose Bld gHb Est-mCnc: 123 mg/dL
Hgb A1c MFr Bld: 5.9 % — ABNORMAL HIGH (ref 4.8–5.6)

## 2019-03-29 LAB — PSA: Prostate Specific Ag, Serum: 1.2 ng/mL (ref 0.0–4.0)

## 2019-03-29 LAB — LIPID PANEL
Chol/HDL Ratio: 6 ratio — ABNORMAL HIGH (ref 0.0–5.0)
Cholesterol, Total: 211 mg/dL — ABNORMAL HIGH (ref 100–199)
HDL: 35 mg/dL — ABNORMAL LOW (ref >39)
LDL Calculated: 152 mg/dL — ABNORMAL HIGH (ref 0–99)
Triglycerides: 118 mg/dL (ref 0–149)
VLDL Cholesterol Cal: 24 mg/dL (ref 5–40)

## 2019-03-29 LAB — THYROID PANEL WITH TSH
Free Thyroxine Index: 2.3 (ref 1.2–4.9)
T3 Uptake Ratio: 30 % (ref 24–39)
T4, Total: 7.5 ug/dL (ref 4.5–12.0)
TSH: 1.23 u[IU]/mL (ref 0.450–4.500)

## 2019-03-29 LAB — VITAMIN D 25 HYDROXY (VIT D DEFICIENCY, FRACTURES): Vit D, 25-Hydroxy: 30.8 ng/mL (ref 30.0–100.0)

## 2019-03-30 ENCOUNTER — Telehealth: Payer: Self-pay | Admitting: Orthopaedic Surgery

## 2019-03-30 NOTE — Telephone Encounter (Signed)
Patient states medication he is taking does not take care of the pain. He still is having pain in knee, and ankle. Patient is now experiencing electrical type pain that is going down into thigh area. Please call to advise. Patient knows Dr. Durward Fortes is out of office until Thursday.

## 2019-03-30 NOTE — Telephone Encounter (Signed)
Please advise 

## 2019-03-30 NOTE — Telephone Encounter (Signed)
Has MRI scheduled of pelvis? Needs return to office if symptoms have changed

## 2019-03-30 NOTE — Telephone Encounter (Signed)
Please call patient to schedule appt w/Dr. Durward Fortes tomorrow if possible if symptoms have changed. Thank you.

## 2019-03-31 ENCOUNTER — Ambulatory Visit (INDEPENDENT_AMBULATORY_CARE_PROVIDER_SITE_OTHER): Payer: BLUE CROSS/BLUE SHIELD | Admitting: Orthopaedic Surgery

## 2019-03-31 ENCOUNTER — Other Ambulatory Visit: Payer: Self-pay

## 2019-03-31 ENCOUNTER — Encounter: Payer: Self-pay | Admitting: Orthopaedic Surgery

## 2019-03-31 VITALS — BP 144/84 | HR 76 | Ht 72.0 in | Wt 301.0 lb

## 2019-03-31 DIAGNOSIS — M25551 Pain in right hip: Secondary | ICD-10-CM

## 2019-03-31 MED ORDER — HYDROCODONE-ACETAMINOPHEN 5-325 MG PO TABS: 1.0000 | ORAL_TABLET | Freq: Four times a day (QID) | ORAL | 0 refills | Status: DC | PRN

## 2019-03-31 NOTE — Progress Notes (Signed)
Office Visit Note   Patient: Darrell Conway           Date of Birth: 1966-04-01           MRN: [id number] Visit Date: 03/31/2019              Requested by: Scot Jun, [address] [place], [place] [address] PCP: Scot Jun, FNP   Assessment & Plan: Visit Diagnoses:  1. Pain of right hip joint     Plan: [name] is seen last week for evaluation of multiple joint complaints.  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  He notes that he is having more pain to the point of compromise.  Tramadol is "not helping".  He does have significant decrease in motion of his right compared to his left hip associated with pain.  We will set up the MRI scan earlier than the scheduled appointment in June and given a prescription for hydrocodone.  I think the problem is related to his hip rather than his back Follow-Up Instructions: Return After MRI scan of pelvis.  .   Orders:  No orders of the defined types were placed in this encounter.  Meds ordered this encounter  Medications  . HYDROcodone-acetaminophen (NORCO/VICODIN) 5-325 MG tablet    Sig: Take 1 tablet by mouth every 6 (six) hours as needed for moderate pain.    Dispense:  30 tablet    Refill:  0      Procedures: No procedures performed   Clinical Data: No additional findings.   Subjective: Chief Complaint  Patient presents with  . Right Leg - Pain  Patient presents today for follow up. He was here on 03/24/19 for his hips and knees. He said that he is having a shocking pain in his right leg. He said that it hurts constantly, but seems worse since the cortisone injections last week. He is scheduled for an MRI of his pelvis. He is taking tramadol for pain.  Patient also complains of left foot pain. He said that it is painful and swollen at the great toe. He has had gout in the past and feels like this is gout again.  [name] relates that he is not diabetic    HPI  Review of Systems  Constitutional: Negative for fatigue.  HENT: Negative for ear pain.   Eyes: Negative for pain.  Respiratory: Negative for shortness of breath.   Cardiovascular: Negative for leg swelling.  Gastrointestinal: Negative for constipation and diarrhea.  Endocrine: Negative for cold intolerance and heat intolerance.  Genitourinary: Negative for difficulty urinating.  Musculoskeletal: Positive for joint swelling.  Skin: Negative for rash.  Allergic/Immunologic: Negative for food allergies.  Neurological: Negative for weakness.  Hematological: Does not bruise/bleed easily.  Psychiatric/Behavioral: Positive for sleep disturbance.     Objective: Vital Signs: BP (!) 144/84   Pulse 76   Ht 6' (1.829 m)   Wt (!) 301 lb (136.5 kg)   BMI 40.82 kg/m   Physical Exam Constitutional:      Appearance: He is well-developed.  Eyes:     Pupils: Pupils are equal, round, and reactive to light.  Pulmonary:     Effort: Pulmonary effort is normal.  Skin:    General: Skin is warm and dry.  Neurological:     Mental Status: He is alert and oriented to person, place, and time.  Psychiatric:        Behavior:  Behavior normal.     Ortho Exam awake alert and oriented x3.  Comfortable sitting.  Straight leg raise negative.  Considerable pain right hip with internal and external rotation and some loss of motion compared to the left side.  No calf pain but does have some discomfort in his knees with prior films demonstrating some arthritis.  No thigh pain calf pain. no percussible tenderness of lumbar spine  Specialty Comments:  No specialty comments available.  Imaging: No results found.   PMFS History: There are no active problems to display for this patient.  Past Medical History:  Diagnosis Date  . Anemia   . GERD (gastroesophageal reflux disease)   . Gout   . Hypertension   . Rheumatoid arthritis ([place])     Family History  Family history unknown: Yes    Past  Surgical History:  Procedure Laterality Date  . HERNIA REPAIR     Social History   Occupational History  . Not on file  Tobacco Use  . Smoking status: Former Research scientist (life sciences)  . Smokeless tobacco: Never Used  Substance and Sexual Activity  . Alcohol use: Yes    Frequency: Never  . Drug use: No  . Sexual activity: Not on file

## 2019-04-02 ENCOUNTER — Other Ambulatory Visit: Payer: Self-pay | Admitting: Family Medicine

## 2019-04-03 NOTE — Telephone Encounter (Signed)
Most recent labs were significant for only elevated cholesterol panel.  I would like to start cholesterol therapy with atorvastatin 40 mg once daily after dinner.  Obtaining good control of your cholesterol is very important to reduce your risk of overall cardiovascular event.  A1c is significant for prediabetes below 5.9 at present no medication is warranted however will repeat A1c at follow-up appointment at next follow-up and A1C is > 6 I would recommend starting low-dose metformin 500 mg once daily.  Kidney and liver functioning were both normal.  PSA level and thyroid function were also normal.  Follow-up appointment scheduled 05/31/2019

## 2019-04-04 MED ORDER — ATORVASTATIN CALCIUM 40 MG PO TABS: 40.0000 mg | ORAL_TABLET | Freq: Every day | ORAL | 3 refills | Status: DC

## 2019-04-04 NOTE — Telephone Encounter (Signed)
Patient notified of lab results & recommendations. Expressed understanding. Is agreeable to starting Lipitor. Prescription sent to pharmacy on file.

## 2019-04-05 ENCOUNTER — Other Ambulatory Visit: Payer: BLUE CROSS/BLUE SHIELD

## 2019-04-05 ENCOUNTER — Telehealth: Payer: Self-pay | Admitting: Orthopaedic Surgery

## 2019-04-05 NOTE — Telephone Encounter (Signed)
Patient's wife [name] left a voicemail stating they received a call from [facility] letting her know that they do not accept [name]'s insurance [employer].  Darrell Conway is requesting a new referral be sent to a facility that takes his insurance.

## 2019-04-07 ENCOUNTER — Ambulatory Visit: Payer: BLUE CROSS/BLUE SHIELD | Admitting: Orthopaedic Surgery

## 2019-04-07 ENCOUNTER — Telehealth: Payer: Self-pay | Admitting: *Deleted

## 2019-04-07 NOTE — Telephone Encounter (Signed)
Patient called. He stated his MRI is scheduled 04/08/2019 at [facility] and he needs an authorization. Can you please help? Thank you!

## 2019-04-07 NOTE — Telephone Encounter (Signed)
pts authorization went into medical review, ordering provider will need to do P2P

## 2019-04-08 ENCOUNTER — Other Ambulatory Visit: Payer: Self-pay | Admitting: Orthopedic Surgery

## 2019-04-11 DIAGNOSIS — G4733 Obstructive sleep apnea (adult) (pediatric): Secondary | ICD-10-CM | POA: Insufficient documentation

## 2019-04-11 DIAGNOSIS — K219 Gastro-esophageal reflux disease without esophagitis: Secondary | ICD-10-CM | POA: Insufficient documentation

## 2019-04-12 ENCOUNTER — Other Ambulatory Visit: Payer: Self-pay | Admitting: Otolaryngology

## 2019-04-13 ENCOUNTER — Other Ambulatory Visit: Payer: Self-pay | Admitting: Otolaryngology

## 2019-04-13 ENCOUNTER — Telehealth: Payer: Self-pay | Admitting: Family Medicine

## 2019-04-13 DIAGNOSIS — K222 Esophageal obstruction: Secondary | ICD-10-CM

## 2019-04-13 DIAGNOSIS — G4733 Obstructive sleep apnea (adult) (pediatric): Secondary | ICD-10-CM

## 2019-04-13 NOTE — Addendum Note (Signed)
Addended by: Scot Jun on: 04/13/2019 02:27 PM   Modules accepted: Orders

## 2019-04-13 NOTE — Telephone Encounter (Signed)
Patient informed of the information below & states that he is ok with doing in home sleep study.  Called sleep center to advise them that order would be updated.

## 2019-04-13 NOTE — Telephone Encounter (Signed)
Terry from the sleep center called to inform CMA that [facility] denied the order for the in lab study but will approve the study if it is at home. Coralyn Mark would like to know if they should continue, please follow up.

## 2019-04-13 NOTE — Telephone Encounter (Signed)
Please advise 

## 2019-04-13 NOTE — Progress Notes (Signed)
Canceled split-night sleep study as not covered by insurance.  Ordered home sleep test.

## 2019-04-13 NOTE — Telephone Encounter (Signed)
Patient aware that sleep center will be contacting him about the home sleep study.

## 2019-04-13 NOTE — Telephone Encounter (Signed)
Contact Mr. Darrell Conway to ask if he would like the home study. If he is okay with the home study, I'll place the updated order.

## 2019-04-13 NOTE — Telephone Encounter (Signed)
Canceled split-night study and ordered home study under patient's initial encounter.

## 2019-04-14 ENCOUNTER — Telehealth: Payer: Self-pay | Admitting: Orthopaedic Surgery

## 2019-04-14 ENCOUNTER — Other Ambulatory Visit: Payer: Self-pay | Admitting: Orthopedic Surgery

## 2019-04-14 MED ORDER — HYDROCODONE-ACETAMINOPHEN 5-325 MG PO TABS: 1.0000 | ORAL_TABLET | Freq: Four times a day (QID) | ORAL | 0 refills | Status: DC | PRN

## 2019-04-14 NOTE — Telephone Encounter (Signed)
MRI scheduled 04/23/2019 at 7:00 pm, [facility] has auth # [id number] valid 04/11/2019 until 10/07/2019, MRI results appt scheduled, patient is aware

## 2019-04-14 NOTE — Telephone Encounter (Signed)
Please advise 

## 2019-04-14 NOTE — Telephone Encounter (Signed)
Patient requesting a refill on Hydrocodone sent to [facility]. Patient has not had MRI as of yet., and had to cancel follow up appt until MRI scheduled.

## 2019-04-14 NOTE — Telephone Encounter (Signed)
Sent!  MRI approved as you know...  :-)

## 2019-04-19 ENCOUNTER — Ambulatory Visit: Payer: BLUE CROSS/BLUE SHIELD | Admitting: Orthopaedic Surgery

## 2019-04-26 ENCOUNTER — Encounter: Payer: Self-pay | Admitting: Orthopaedic Surgery

## 2019-04-26 ENCOUNTER — Other Ambulatory Visit: Payer: Self-pay

## 2019-04-26 ENCOUNTER — Ambulatory Visit: Payer: BLUE CROSS/BLUE SHIELD | Admitting: Orthopaedic Surgery

## 2019-04-26 VITALS — BP 151/99 | HR 86 | Ht 72.0 in | Wt 301.0 lb

## 2019-04-26 DIAGNOSIS — M25552 Pain in left hip: Secondary | ICD-10-CM

## 2019-04-26 DIAGNOSIS — M25551 Pain in right hip: Secondary | ICD-10-CM | POA: Insufficient documentation

## 2019-04-26 DIAGNOSIS — M2550 Pain in unspecified joint: Secondary | ICD-10-CM | POA: Diagnosis not present

## 2019-04-26 DIAGNOSIS — M79672 Pain in left foot: Secondary | ICD-10-CM | POA: Insufficient documentation

## 2019-04-26 NOTE — Progress Notes (Deleted)
   Office Visit Note   Patient: Darrell Conway           Date of Birth: 11-Jan-1966           MRN: [id number] Visit Date: 04/26/2019              Requested by: Scot Jun, [address] [place], [place] [address] PCP: Scot Jun, FNP   Assessment & Plan: Visit Diagnoses: No diagnosis found.  Plan: ***  Follow-Up Instructions: No follow-ups on file.   Orders:  No orders of the defined types were placed in this encounter.  No orders of the defined types were placed in this encounter.     Procedures: No procedures performed   Clinical Data: No additional findings.   Subjective: Chief Complaint  Patient presents with  . Right Hip - Follow-up  Patient presents today for follow up on his right hip. He had an MRI of his pelvis on 04/23/19 and is here today for results. He states that both hips hurt and his right knee. He is taking hydrocodone for pain, but wants his dosage increased.  HPI  Review of Systems  Constitutional: Negative for fatigue.  HENT: Negative for ear pain.   Eyes: Negative for pain.  Respiratory: Negative for shortness of breath.   Cardiovascular: Negative for leg swelling.  Gastrointestinal: Negative for constipation and diarrhea.  Endocrine: Negative for cold intolerance and heat intolerance.  Genitourinary: Negative for difficulty urinating.  Musculoskeletal: Positive for joint swelling.  Skin: Negative for rash.  Allergic/Immunologic: Negative for food allergies.  Neurological: Negative for weakness.  Hematological: Does not bruise/bleed easily.  Psychiatric/Behavioral: Positive for sleep disturbance.     Objective: Vital Signs: There were no vitals taken for this visit.  Physical Exam  Ortho Exam  Specialty Comments:  No specialty comments available.  Imaging: No results found.   PMFS History: There are no active problems to display for this patient.  Past Medical History:  Diagnosis Date  . Anemia    . GERD (gastroesophageal reflux disease)   . Gout   . Hypertension   . Rheumatoid arthritis ([place])     Family History  Family history unknown: Yes    Past Surgical History:  Procedure Laterality Date  . HERNIA REPAIR     Social History   Occupational History  . Not on file  Tobacco Use  . Smoking status: Former Research scientist (life sciences)  . Smokeless tobacco: Never Used  Substance and Sexual Activity  . Alcohol use: Yes    Frequency: Never  . Drug use: No  . Sexual activity: Not on file

## 2019-04-26 NOTE — Progress Notes (Signed)
Office Visit Note   Patient: Darrell Conway           Date of Birth: 04-Sep-1966           MRN: [id number] Visit Date: 04/26/2019              Requested by: Scot Jun, [address] [place], [place] [address] PCP: Scot Jun, FNP   Assessment & Plan: Visit Diagnoses:  1. Arthralgia of multiple joints   2. Pain of both hip joints     Plan: MRI scan of the pelvis and both hips was obtained in [place].  This demonstrates a mild degenerative changes of both hips.  There is a labral tear on the left.  Mr. Wallman is having multiple joint is including both hips both knees elbows and hands.  I would like to obtain some lab work to include CBC, sed rate, C-reactive protein and  RA factor.  Also will order intra-articular cortisone injection of his right hip as that seems to be the most uncomfortable joint.  Continue with the hydrocodone taking 1 or 2 up to 3-4 times a day as needed.  He is really having a difficult time with his joint pain.  Also is having some difficulty with both knees.  Prior films demonstrate some mild degenerative changes but I suspect some of the pain may be referred from his hips.  I am also concerned that he might have some referred pain from his lumbar spine as he has had difficulty in the past treated in [place].  Will ask him to obtain copies of his prior MRI scans  Follow-Up Instructions: No follow-ups on file.   Orders:  Orders Placed This Encounter  Procedures  . Rheumatoid factor  . CBC with Differential/Platelet  . Sedimentation rate  . C-reactive protein   No orders of the defined types were placed in this encounter.     Procedures: No procedures performed   Clinical Data: No additional findings.   Subjective: Chief Complaint  Patient presents with  . Right Hip - Follow-up  Mr. Berka returns for follow-up evaluation of hip and knee pain.  He is also had some chronic problems with his lumbar spine.  He  notes that he was treated in [place] years ago and had an MRI scan.  He does not have the results.  He is also had difficulty with elbow wrist and hand pain.  He has not had any lab work.  He has been experiencing pain in both groins the and referred pain to both thighs and knees.  MRI scan was performed at [facility] on 04/23/2019.  This demonstrates mild bilateral hip degenerative changes with tear of the left superior labrum and mild fraying of the right labrum.  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  No evidence of avascular necrosis.  There was synovial herniation along the peripheral lateral margin of the right femoral head and neck junction and moderate right gluteus medius tendinosis.  Has been taking 5 mg of hydrocodone up to 4 times a day and notes that sometimes that is "and not enough.  He  would like to continue working having difficulty with multiple joint pain  HPI  Review of Systems   Objective: Vital Signs: BP (!) 151/99   Pulse 86   Ht 6' (1.829 m)   Wt (!) 301 lb (136.5 kg)   BMI 40.82 kg/m   Physical Exam Constitutional:  Appearance: He is well-developed.  Eyes:     Pupils: Pupils are equal, round, and reactive to light.  Pulmonary:     Effort: Pulmonary effort is normal.  Skin:    General: Skin is warm and dry.  Neurological:     Mental Status: He is alert and oriented to person, place, and time.  Psychiatric:        Behavior: Behavior normal.     Ortho Exam awake alert and oriented x3.  Comfortable sitting.  Does have a slight limp referable to his right hip.  He might of had a very small effusion of his right knee with some medial joint tenderness and bilateral patellar crepitation.  Still has some decreased range of motion of both hips but more so on the right than the left.  Straight leg raise is negative.  Did not have any obvious swelling of either hand or wrist.  Good grip.  Appears to have normal neurologic exam to both  upper and lower extremities  Specialty Comments:  No specialty comments available.  Imaging: No results found.   PMFS History: Patient Active Problem List   Diagnosis Date Noted  . Arthralgia of multiple joints 04/26/2019  . Pain of both hip joints 04/26/2019   Past Medical History:  Diagnosis Date  . Anemia   . GERD (gastroesophageal reflux disease)   . Gout   . Hypertension   . Rheumatoid arthritis ([place])     Family History  Family history unknown: Yes    Past Surgical History:  Procedure Laterality Date  . HERNIA REPAIR     Social History   Occupational History  . Not on file  Tobacco Use  . Smoking status: Former Research scientist (life sciences)  . Smokeless tobacco: Never Used  Substance and Sexual Activity  . Alcohol use: Yes    Frequency: Never  . Drug use: No  . Sexual activity: Not on file     Garald Balding, MD   Note - This record has been created using [employer].  Chart creation errors have been sought, but may not always  have been located. Such creation errors do not reflect on  the standard of medical care.

## 2019-04-26 NOTE — Addendum Note (Signed)
Addended by: Lendon Collar on: 04/26/2019 12:06 PM   Modules accepted: Orders

## 2019-04-28 LAB — CBC WITH DIFFERENTIAL/PLATELET
Absolute Monocytes: 531 cells/uL (ref 200–950)
Basophils Absolute: 30 cells/uL (ref 0–200)
Basophils Relative: 0.5 %
Eosinophils Absolute: 189 cells/uL (ref 15–500)
Eosinophils Relative: 3.2 %
HCT: 44.6 % (ref 38.5–50.0)
Hemoglobin: 15 g/dL (ref 13.2–17.1)
Lymphs Abs: 2130 cells/uL (ref 850–3900)
MCH: 29 pg (ref 27.0–33.0)
MCHC: 33.6 g/dL (ref 32.0–36.0)
MCV: 86.1 fL (ref 80.0–100.0)
MPV: 10.3 fL (ref 7.5–12.5)
Monocytes Relative: 9 %
Neutro Abs: 3021 cells/uL (ref 1500–7800)
Neutrophils Relative %: 51.2 %
Platelets: 277 10*3/uL (ref 140–400)
RBC: 5.18 10*6/uL (ref 4.20–5.80)
RDW: 13.6 % (ref 11.0–15.0)
Total Lymphocyte: 36.1 %
WBC: 5.9 10*3/uL (ref 3.8–10.8)

## 2019-04-28 LAB — C-REACTIVE PROTEIN: CRP: 4.3 mg/L (ref <8.0)

## 2019-04-28 LAB — SEDIMENTATION RATE: Sed Rate: 6 mm/h (ref 0–20)

## 2019-04-28 LAB — RHEUMATOID FACTOR: Rhuematoid fact SerPl-aCnc: 16 IU/mL — ABNORMAL HIGH (ref <14)

## 2019-04-29 ENCOUNTER — Other Ambulatory Visit: Payer: Self-pay | Admitting: *Deleted

## 2019-04-29 ENCOUNTER — Telehealth: Payer: Self-pay | Admitting: Orthopaedic Surgery

## 2019-04-29 DIAGNOSIS — G8929 Other chronic pain: Secondary | ICD-10-CM

## 2019-04-29 NOTE — Telephone Encounter (Signed)
Dr. Durward Fortes advised patient to go to Urgent Care if pain is severe. I called patient, patient is going to hospital due to severe pain.

## 2019-04-29 NOTE — Telephone Encounter (Signed)
Per patient knee is hurting him worse than his hip. Per patient, it feels like something is ripped in there. It is not the typical pain he usually has. Patient wants to know about getting a brace for his knee, and if that would help? Patient is also out of pain medication. Patient uses Paediatric nurse on [place]. Please call patient to advise. Does patient need to schedule a rov for his knee?

## 2019-05-04 ENCOUNTER — Telehealth: Payer: Self-pay | Admitting: Orthopaedic Surgery

## 2019-05-04 ENCOUNTER — Other Ambulatory Visit: Payer: Self-pay | Admitting: Orthopaedic Surgery

## 2019-05-04 DIAGNOSIS — Z760 Encounter for issue of repeat prescription: Secondary | ICD-10-CM

## 2019-05-04 DIAGNOSIS — I10 Essential (primary) hypertension: Secondary | ICD-10-CM

## 2019-05-04 MED ORDER — HYDROCODONE-ACETAMINOPHEN 5-325 MG PO TABS: 1.0000 | ORAL_TABLET | Freq: Two times a day (BID) | ORAL | 0 refills | Status: DC | PRN

## 2019-05-04 NOTE — Telephone Encounter (Signed)
Please advise 

## 2019-05-04 NOTE — Telephone Encounter (Signed)
Called patient. No answer. Left message that pain medicine had been sent in to pharmacy.

## 2019-05-04 NOTE — Telephone Encounter (Signed)
Prescription sent

## 2019-05-04 NOTE — Telephone Encounter (Signed)
Patient scheduled an appt for his knee for tomorrow, but he has not had any pain medication. Patient wants to know if doctor can call him something in for pain today. Patient uses Paediatric nurse on [place]. Patient did not go to [facility]. He sat at home, iced knee, and stayed off of it. Now back on it today. Pain has returned.

## 2019-05-05 ENCOUNTER — Ambulatory Visit: Payer: BLUE CROSS/BLUE SHIELD | Admitting: Orthopaedic Surgery

## 2019-05-05 ENCOUNTER — Other Ambulatory Visit: Payer: Self-pay

## 2019-05-05 ENCOUNTER — Encounter: Payer: Self-pay | Admitting: Orthopaedic Surgery

## 2019-05-05 VITALS — BP 133/91 | HR 76 | Ht 72.0 in | Wt 301.0 lb

## 2019-05-05 DIAGNOSIS — M25552 Pain in left hip: Secondary | ICD-10-CM

## 2019-05-05 DIAGNOSIS — M05752 Rheumatoid arthritis with rheumatoid factor of left hip without organ or systems involvement: Secondary | ICD-10-CM

## 2019-05-05 DIAGNOSIS — M05751 Rheumatoid arthritis with rheumatoid factor of right hip without organ or systems involvement: Secondary | ICD-10-CM | POA: Diagnosis not present

## 2019-05-05 DIAGNOSIS — M160 Bilateral primary osteoarthritis of hip: Secondary | ICD-10-CM | POA: Diagnosis not present

## 2019-05-05 DIAGNOSIS — M069 Rheumatoid arthritis, unspecified: Secondary | ICD-10-CM | POA: Insufficient documentation

## 2019-05-05 DIAGNOSIS — M25551 Pain in right hip: Secondary | ICD-10-CM

## 2019-05-05 DIAGNOSIS — M170 Bilateral primary osteoarthritis of knee: Secondary | ICD-10-CM | POA: Diagnosis not present

## 2019-05-05 NOTE — Progress Notes (Signed)
Office Visit Note   Patient: Darrell Conway           Date of Birth: 1966-05-21           MRN: [id number] Visit Date: 05/05/2019              Requested by: Scot Jun, [address],  [place] [address] PCP: Scot Jun, FNP   Assessment & Plan: Visit Diagnoses:  1. Pain of both hip joints   2. Bilateral primary osteoarthritis of knee   3. 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 Primary osteoarthritis of both hips     Plan:  #1: Appointment with Dr. Kathee Delton for evaluation of rheumatoid arthritis #2: We will obtain current gel over-the-counter and use this 3 times a day to both knees.  Follow-Up Instructions: Return in about 4 weeks (around 06/02/2019).   Face-to-face time spent with patient was greater than 30 minutes.  Greater than 50% of the time was spent in counseling and coordination of care.  Orders:  No orders of the defined types were placed in this encounter.  No orders of the defined types were placed in this encounter.     Procedures: No procedures performed   Clinical Data: No additional findings.   Subjective: Chief Complaint  Patient presents with  . Right Knee - Pain   HPI Patient presents today for a 9 day follow up on his right knee. Patient states that he is having continued pain. He has bought an OTC knee brace and using ice. And pivoting on his leg causes more pain. Dr.Whitfield sent in hydrocodone for him yesterday.  He has had joint laboratory studies obtained evaluation of rheumatologic problem.  His symptoms are not improving at this time.   Review of Systems  Constitutional: Negative for fatigue.  HENT: Negative for ear pain.   Eyes: Negative for pain.  Respiratory: Negative for shortness of breath.   Cardiovascular: Negative for leg swelling.  Gastrointestinal: Negative for constipation and diarrhea.  Genitourinary: Negative for difficulty urinating.   Musculoskeletal: Positive for joint swelling.  Skin: Negative for rash.  Allergic/Immunologic: Negative for food allergies.  Neurological: Negative for weakness.  Hematological: Does not bruise/bleed easily.  Psychiatric/Behavioral: Positive for sleep disturbance.     Objective: Vital Signs: BP (!) 133/91   Pulse 76   Ht 6' (1.829 m)   Wt (!) 301 lb (136.5 kg)   BMI 40.82 kg/m   Physical Exam Constitutional:      Appearance: He is well-developed. He is obese.  Eyes:     Pupils: Pupils are equal, round, and reactive to light.  Pulmonary:     Effort: Pulmonary effort is normal.  Skin:    General: Skin is warm and dry.  Neurological:     Mental Status: He is alert and oriented to person, place, and time.  Psychiatric:        Behavior: Behavior normal.     Ortho Exam  He does have limited motion of the hips with internal and external rotation.  He can sit at [place] comfortably.  Straight leg raise negative.  He has lost about 5 or so degrees of full extension.  Flexion to about 90 degrees only.  Mild effusions bilaterally.   Specialty Comments:  No specialty comments available.  Imaging: No results found.   PMFS History: Current Outpatient Medications  Medication Sig Dispense Refill  . atorvastatin (LIPITOR) 40 MG tablet Take  1 tablet (40 mg total) by mouth daily. 90 tablet 3  . gabapentin (NEURONTIN) 300 MG capsule Take 1 capsule (300 mg total) by mouth at bedtime as needed and may repeat dose one time if needed. 90 capsule 3  . HYDROcodone-acetaminophen (NORCO/VICODIN) 5-325 MG tablet Take 1 tablet by mouth every 6 (six) hours as needed for moderate pain. 30 tablet 0  . HYDROcodone-acetaminophen (NORCO/VICODIN) 5-325 MG tablet Take 1 tablet by mouth 2 (two) times daily as needed for moderate pain. 30 tablet 0  . lisinopril (ZESTRIL) 20 MG tablet Take 1 tablet (20 mg total) by mouth daily. 30 tablet 11  . meloxicam (MOBIC) 15 MG tablet Take 1 tablet (15 mg total)  by mouth daily. 30 tablet 3  . Multiple Vitamins-Minerals (MENS MULTIVITAMIN PLUS PO) Take 1 tablet by mouth daily.    Marland Kitchen omeprazole (PRILOSEC) 40 MG capsule Take 1 capsule (40 mg total) by mouth daily. 30 capsule 3  . traMADol (ULTRAM) 50 MG tablet Take 1 tablet (50 mg total) by mouth every 6 (six) hours as needed (tid prn as needed pain). 30 tablet 0  . amLODipine (NORVASC) 10 MG tablet Take 1 tablet (10 mg total) by mouth daily. 42 tablet 0   No current facility-administered medications for this visit.     Patient Active Problem List   Diagnosis Date Noted  . Bilateral primary osteoarthritis of knee 05/05/2019  . Rheumatoid arthritis ([place])   . Primary osteoarthritis of both hips   . Arthralgia of multiple joints 04/26/2019  . Pain of both hip joints 04/26/2019   Past Medical History:  Diagnosis Date  . Anemia   . GERD (gastroesophageal reflux disease)   . Gout   . Hypertension   . Rheumatoid arthritis ([place])     Family History  Family history unknown: Yes    Past Surgical History:  Procedure Laterality Date  . HERNIA REPAIR     Social History   Occupational History  . Not on file  Tobacco Use  . Smoking status: Former Research scientist (life sciences)  . Smokeless tobacco: Never Used  Substance and Sexual Activity  . Alcohol use: Yes    Frequency: Never  . Drug use: No  . Sexual activity: Not on file

## 2019-05-05 NOTE — Progress Notes (Signed)
Office Visit Note  Patient: Darrell Conway             Date of Birth: [birth year]           MRN: [id number]             PCP: Scot Jun, FNP Referring: Scot Jun, FNP Visit Date: 05/10/2019 Occupation: [employer]  Subjective:  Pain in multiple joints.   History of Present Illness: Darrell Conway is a 53 y.o. male in consultation per request of Dr. Ree Edman.  According to the patient he has had history of knee joint discomfort since 2009.  He states he has been going to the pain clinic for many years and had cortisone injections and pain management.  He used to work as a Games developer and became more difficult for him to lift objects and to do his job.  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  Now he intermittently works as a Games developer.  He states he is in constant pain in his knee joints and his hip joints.  He also has discomfort in his elbows shoulders and hands at times.  He denies any history of joint swelling.  He came to see Dr. Durward Fortes and had x-ray of his knee joints and hip joints which were consistent with osteoarthritis.  He has been having a lot of discomfort in his left hip joint for which she had MRI.  Which showed some labral tear and degenerative changes.  No synovitis was noted.  He is a scheduled to have left hip joint cortisone injection.  His labs were positive for low titer rheumatoid factor.  There is but strong family history of rheumatoid arthritis per patient.  Activities of Daily Living:  Patient reports morning stiffness for 2 hours.   Patient Reports nocturnal pain.  Difficulty dressing/grooming: Reports Difficulty climbing stairs: Reports Difficulty getting out of chair: Reports Difficulty using hands for taps, buttons, cutlery, and/or writing: Reports  Review of Systems  Constitutional: Negative for fatigue and night sweats.  HENT: Negative for  mouth sores, mouth dryness and nose dryness.   Eyes: Negative for redness and dryness.  Respiratory: Negative for shortness of breath and difficulty breathing.   Cardiovascular: Positive for swelling in legs/feet. Negative for chest pain, palpitations, hypertension and irregular heartbeat.  Gastrointestinal: Negative for constipation and diarrhea.  Endocrine: Negative for cold intolerance and increased urination.  Genitourinary: Negative for difficulty urinating.  Musculoskeletal: Positive for arthralgias, gait problem, joint pain, myalgias, morning stiffness and myalgias. Negative for joint swelling, muscle weakness and muscle tenderness.  Skin: Negative for color change, rash, hair loss, nodules/bumps, skin tightness, ulcers and sensitivity to sunlight.  Allergic/Immunologic: Negative for susceptible to infections.  Neurological: Positive for weakness. Negative for dizziness, fainting, numbness, memory loss and night sweats.  Hematological: Negative for bruising/bleeding tendency and swollen glands.  Psychiatric/Behavioral: Positive for sleep disturbance. Negative for depressed mood. The patient is not nervous/anxious.     PMFS History:  Patient Active Problem List   Diagnosis Date Noted  . Bilateral primary osteoarthritis of knee 05/05/2019  . Primary osteoarthritis of both hips   . Arthralgia of multiple joints 04/26/2019  . Pain of both hip joints 04/26/2019    Past Medical History:  Diagnosis Date  . Anemia   . GERD (gastroesophageal reflux disease)   . Gout   . Hypertension   . Rheumatoid arthritis ([place])  Family History  Problem Relation Age of Onset  . Rheum arthritis Mother   . Rheum arthritis Sister    Past Surgical History:  Procedure Laterality Date  . HERNIA REPAIR     Social History   Social History Narrative  . Not on file   Immunization History  Administered Date(s) Administered  . Influenza,inj,Quad PF,6+ Mos 10/20/2018     Objective: Vital  Signs: BP 131/82 (BP Location: Right Arm, Patient Position: Sitting, Cuff Size: Normal)   Pulse 75   Resp 14   Ht 5\' 11"  (1.803 m)   Wt (!) 300 lb 3.2 oz (136.2 kg)   BMI 41.87 kg/m    Physical Exam Vitals signs and nursing note reviewed.  Constitutional:      Appearance: He is well-developed. He is obese.  HENT:     Head: Normocephalic and atraumatic.  Eyes:     Conjunctiva/sclera: Conjunctivae normal.     Pupils: Pupils are equal, round, and reactive to light.  Neck:     Musculoskeletal: Normal range of motion and neck supple.  Cardiovascular:     Rate and Rhythm: Normal rate and regular rhythm.     Heart sounds: Normal heart sounds.  Pulmonary:     Effort: Pulmonary effort is normal.     Breath sounds: Normal breath sounds.  Abdominal:     General: Bowel sounds are normal.     Palpations: Abdomen is soft.  Skin:    General: Skin is warm and dry.     Capillary Refill: Capillary refill takes less than 2 seconds.  Neurological:     Mental Status: He is alert and oriented to person, place, and time.  Psychiatric:        Behavior: Behavior normal.      Musculoskeletal Exam: Difficult to assess.  Shoulder joints elbow joints wrist joint MCPs were in good range of motion.  He has some PIP and DIP thickening due to osteoarthritis but no synovitis was noted.  He had painful range of motion of bilateral hip joints.  Knee joints were in full range of motion without any warmth swelling or effusion.  Ankle joints and MTPs with good range of motion with no synovitis.  CDAI Exam: CDAI Score: - Patient Global: -; Provider Global: - Swollen: -; Tender: - Joint Exam   No joint exam has been documented for this visit   There is currently no information documented on the homunculus. Go to the Rheumatology activity and complete the homunculus joint exam.  Investigation: Findings:  04/27/19: RF 16, sed rate 6, CRP 4.3   Imaging: No results found.  Recent Labs: Lab Results   Component Value Date   WBC 5.9 04/27/2019   HGB 15.0 04/27/2019   PLT 277 04/27/2019   NA 142 03/28/2019   K 4.1 03/28/2019   CL 107 (H) 03/28/2019   CO2 20 03/28/2019   GLUCOSE 101 (H) 03/28/2019   BUN 11 03/28/2019   CREATININE 0.73 (L) 03/28/2019   BILITOT 0.4 03/28/2019   ALKPHOS 89 03/28/2019   AST 26 03/28/2019   ALT 40 03/28/2019   PROT 7.4 03/28/2019   ALBUMIN 4.5 03/28/2019   CALCIUM 9.6 03/28/2019   GFRAA 124 03/28/2019    Speciality Comments: No specialty comments available.  Procedures:  No procedures performed Allergies: Patient has no known allergies.   Assessment / Plan:     Visit Diagnoses: Chronic pain of right knee -patient had been in pain management for many many years.  I  reviewed x-rays done by Dr. Durward Fortes which were consistent with osteoarthritis.  He had no synovitis on examination.  He had recent low positive rheumatoid factor.  He is positive family history of rheumatoid arthritis.  He has no synovitis on examination.  Primary osteoarthritis of both hips -his x-rays and MRIs were consistent with osteoarthritis.  He also had small labral tear.  He is a scheduled to have cortisone injection to his left hip joint.  He will follow-up with Dr. Durward Fortes.  Bilateral primary osteoarthritis of knee -he will benefit from weight loss diet exercise and muscle strengthening.  Rheumatoid factor positive - 04/27/19: RF 16, sed rate 6, CRP 4.3 - Plan: Patient has no clinical features of rheumatoid arthritis on examination.  He has low titer positive rheumatoid factor.  There is positive family history of rheumatoid arthritis.  I have advised him to contact me in case he develops increased inflammation.  Orders: No orders of the defined types were placed in this encounter.  No orders of the defined types were placed in this encounter.   Face-to-face time spent with patient was  minutes. Greater than 50% of time was spent in counseling and coordination of care.   Follow-Up Instructions: Return if symptoms worsen or fail to improve, for Osteoarthritis.   Bo Merino, MD  Note - This record has been created using Editor, commissioning.  Chart creation errors have been sought, but may not always  have been located. Such creation errors do not reflect on  the standard of medical care.

## 2019-05-06 ENCOUNTER — Other Ambulatory Visit: Payer: BLUE CROSS/BLUE SHIELD

## 2019-05-10 ENCOUNTER — Ambulatory Visit: Payer: BLUE CROSS/BLUE SHIELD | Admitting: Rheumatology

## 2019-05-10 ENCOUNTER — Encounter: Payer: Self-pay | Admitting: Rheumatology

## 2019-05-10 ENCOUNTER — Other Ambulatory Visit: Payer: Self-pay

## 2019-05-10 VITALS — BP 131/82 | HR 75 | Resp 14 | Ht 71.0 in | Wt 300.2 lb

## 2019-05-10 DIAGNOSIS — G8929 Other chronic pain: Secondary | ICD-10-CM

## 2019-05-10 DIAGNOSIS — M170 Bilateral primary osteoarthritis of knee: Secondary | ICD-10-CM | POA: Diagnosis not present

## 2019-05-10 DIAGNOSIS — R768 Other specified abnormal immunological findings in serum: Secondary | ICD-10-CM

## 2019-05-10 DIAGNOSIS — M160 Bilateral primary osteoarthritis of hip: Secondary | ICD-10-CM | POA: Diagnosis not present

## 2019-05-10 DIAGNOSIS — M25561 Pain in right knee: Secondary | ICD-10-CM

## 2019-05-13 ENCOUNTER — Ambulatory Visit
Admission: RE | Admit: 2019-05-13 | Discharge: 2019-05-13 | Disposition: A | Discharge status: Home / Self Care | Payer: BLUE CROSS/BLUE SHIELD | Source: Ambulatory Visit | Attending: Orthopaedic Surgery | Admitting: Orthopaedic Surgery

## 2019-05-13 ENCOUNTER — Other Ambulatory Visit: Payer: Self-pay

## 2019-05-13 DIAGNOSIS — M25552 Pain in left hip: Secondary | ICD-10-CM

## 2019-05-13 DIAGNOSIS — M25551 Pain in right hip: Secondary | ICD-10-CM

## 2019-05-13 MED ORDER — METHYLPREDNISOLONE ACETATE 40 MG/ML INJ SUSP (RADIOLOG
120.0000 mg | Freq: Once | INTRAMUSCULAR | Status: AC
  Administered 2019-05-13: 120 mg via INTRA_ARTICULAR

## 2019-05-13 MED ORDER — IOPAMIDOL (ISOVUE-M 200) INJECTION 41%
1.0000 mL | Freq: Once | INTRAMUSCULAR | Status: AC
  Administered 2019-05-13: 09:00:00 1 mL via INTRA_ARTICULAR

## 2019-05-16 ENCOUNTER — Telehealth: Payer: Self-pay | Admitting: Orthopaedic Surgery

## 2019-05-16 NOTE — Telephone Encounter (Signed)
Patient called requesting prescription refill of Hydrocodone to be sent to [facility].  Patient states he had appointment with Dr. Estanislado Pandy and stated "she barely looked at me and acted like she didn't want to touch me."  Patient states his knees are swelling and his right hip is very painful.  Patient is requesting a referral "to a doctor who can help me."

## 2019-05-17 NOTE — Telephone Encounter (Signed)
Please call-see Dr Arlean Hopping note

## 2019-05-17 NOTE — Telephone Encounter (Signed)
Called, left message and will call back later '

## 2019-05-17 NOTE — Telephone Encounter (Signed)
Please advise 

## 2019-05-18 ENCOUNTER — Other Ambulatory Visit: Payer: Self-pay | Admitting: Orthopaedic Surgery

## 2019-05-18 MED ORDER — HYDROCODONE-ACETAMINOPHEN 5-325 MG PO TABS: 1.0000 | ORAL_TABLET | Freq: Four times a day (QID) | ORAL | 0 refills | Status: DC | PRN

## 2019-05-18 NOTE — Telephone Encounter (Signed)
Schedule MRI L-S spine-hx of LBP with referred discomfort to both hips and thighs

## 2019-05-19 ENCOUNTER — Other Ambulatory Visit: Payer: Self-pay | Admitting: Orthopaedic Surgery

## 2019-05-19 DIAGNOSIS — M25551 Pain in right hip: Secondary | ICD-10-CM

## 2019-05-19 DIAGNOSIS — G8929 Other chronic pain: Secondary | ICD-10-CM

## 2019-05-19 NOTE — Telephone Encounter (Signed)
MRI has been ordered

## 2019-05-30 ENCOUNTER — Telehealth: Payer: Self-pay

## 2019-05-30 NOTE — Telephone Encounter (Signed)
Called patient to do their pre-visit COVID screening.  Call went to voicemail. Unable to do prescreening.  

## 2019-05-31 ENCOUNTER — Encounter: Payer: Self-pay | Admitting: Family Medicine

## 2019-05-31 ENCOUNTER — Other Ambulatory Visit: Payer: Self-pay

## 2019-05-31 ENCOUNTER — Ambulatory Visit (INDEPENDENT_AMBULATORY_CARE_PROVIDER_SITE_OTHER): Payer: BLUE CROSS/BLUE SHIELD | Admitting: Family Medicine

## 2019-05-31 VITALS — BP 148/88 | HR 69 | Temp 97.3°F | Resp 17 | Ht 71.0 in | Wt 301.6 lb

## 2019-05-31 DIAGNOSIS — I10 Essential (primary) hypertension: Secondary | ICD-10-CM | POA: Diagnosis not present

## 2019-05-31 DIAGNOSIS — Z1211 Encounter for screening for malignant neoplasm of colon: Secondary | ICD-10-CM | POA: Diagnosis not present

## 2019-05-31 DIAGNOSIS — E785 Hyperlipidemia, unspecified: Secondary | ICD-10-CM | POA: Diagnosis not present

## 2019-05-31 MED ORDER — PANTOPRAZOLE SODIUM 40 MG PO TBEC: DELAYED_RELEASE_TABLET | ORAL | 1 refills | Status: DC

## 2019-05-31 MED ORDER — AMLODIPINE BESYLATE 10 MG PO TABS: 10.0000 mg | ORAL_TABLET | Freq: Every day | ORAL | 1 refills | Status: DC

## 2019-05-31 NOTE — Progress Notes (Signed)
Patient ID: Darrell Conway, male    DOB: 27-Feb-1966, 53 y.o.   MRN: [id number]  PCP: Scot Jun, FNP  Chief Complaint  Patient presents with  . Hypertension  . Hyperlipidemia    Subjective:  HPI  Darrell Conway is a 53 y.o. male presents for evaluation hypertension and hyperlipidemia.  Darrell Conway has Arthralgia of multiple joints; Pain of both hip joints; Bilateral primary osteoarthritis of knee; Primary osteoarthritis of both hips; Obstructive sleep apnea of adult; and Laryngopharyngeal reflux (LPR) on their problem list.   Darrell Conway reports no home monitoring of blood pressure. BP elevated today, although took medication less than 30 minutes ago. Reports adherence to blood pressure medications. Reports efforts to adhere to low sodium diet. He is a nonsmoker. Recently started a new KETO diet and in efforts to loose weight. Current Body mass index is 42.06 kg/m. Denies any episodes of dizziness, headaches, shortness of breath, or chest pain.  Darrell Conway was recently prescribed Atorvastatin for  management of mix hyperlipidemia. He is compliant with medication therapy.  The 10-year ASCVD risk score Mikey Bussing DC Brooke Bonito., et al., 2013) is: 14%   Values used to calculate the score:     Age: 37 years     Sex: Male     Is Non-Hispanic African American: Yes     Diabetic: No     Tobacco smoker: No     Systolic Blood Pressure: 413 mmHg     Is BP treated: Yes     HDL Cholesterol: 35 mg/dL     Total Cholesterol: 211 mg/dL Need colon cancer screening. History of EGD and Colonoscopy over 10 yeas ago to evaluate complex GERD. Denies rectal bleeding. Due to Colonoscopy. Social History   Socioeconomic History  . Marital status: Married    Spouse name: Not on file  . Number of children: Not on file  . Years of education: Not on file  . Highest education level: Not on file  Occupational History  . Not on file  Social Needs  . Financial resource strain: Not on file  . Food insecurity    Worry: Not on file    Inability: Not on file  . Transportation needs    Medical: Not on file    Non-medical: Not on file  Tobacco Use  . Smoking status: Former Smoker    Packs/day: 0.75    Types: Cigarettes    Quit date: 2015    Years since quitting: 5.5  . Smokeless tobacco: Never Used  Substance and Sexual Activity  . Alcohol use: Not Currently    Frequency: Never  . Drug use: No  . Sexual activity: Not on file  Lifestyle  . Physical activity    Days per week: Not on file    Minutes per session: Not on file  . Stress: Not on file  Relationships  . Social Herbalist on phone: Not on file    Gets together: Not on file    Attends religious service: Not on file    Active member of club or organization: Not on file    Attends meetings of clubs or organizations: Not on file    Relationship status: Not on file  . Intimate partner violence    Fear of current or ex partner: Not on file    Emotionally abused: Not on file    Physically abused: Not on file    Forced sexual activity: Not on file  Other Topics Concern  . Not  on file  Social History Narrative  . Not on file    Family History  Problem Relation Age of Onset  . Rheum arthritis Mother   . Rheum arthritis Sister      Review of Systems Pertinent negatives listed in HPI No Known Allergies  Prior to Admission medications   Medication Sig Start Date End Date Taking? Authorizing Provider  amLODipine (NORVASC) 10 MG tablet Take 1 tablet (10 mg total) by mouth daily. 05/31/19  Yes Scot Jun, FNP  atorvastatin (LIPITOR) 40 MG tablet Take 1 tablet (40 mg total) by mouth daily. 04/04/19  Yes Scot Jun, FNP  gabapentin (NEURONTIN) 300 MG capsule Take 1 capsule (300 mg total) by mouth at bedtime as needed and may repeat dose one time if needed. 03/21/19  Yes Scot Jun, FNP  HYDROcodone-acetaminophen (NORCO/VICODIN) 5-325 MG tablet Take 1 tablet by mouth every 6 (six) hours as needed for  moderate pain. 05/18/19  Yes Garald Balding, MD  lisinopril (ZESTRIL) 20 MG tablet Take 1 tablet (20 mg total) by mouth daily. 03/21/19 12/16/19 Yes Scot Jun, FNP  Multiple Vitamins-Minerals (MENS MULTIVITAMIN PLUS PO) Take 1 tablet by mouth daily.   Yes [provider]  pantoprazole (PROTONIX) 40 MG tablet One pill(40mg ) 30 minutes before breakfast and one pill 30 minutes before dinner. 05/31/19  Yes Scot Jun, FNP    Past Medical, Surgical Family and Social History reviewed and updated.    Objective:   Today's Vitals   05/31/19 0834  BP: (!) 148/88  Pulse: 69  Resp: 17  Temp: (!) 97.3 F (36.3 C)  TempSrc: Temporal  SpO2: 95%  Weight: (!) 301 lb 9.6 oz (136.8 kg)  Height: 5\' 11"  (1.803 m)    BP Readings from Last 3 Encounters:  05/31/19 (!) 148/88  05/10/19 131/82  05/05/19 (!) 133/91    Filed Weights   05/31/19 0834  Weight: (!) 301 lb 9.6 oz (136.8 kg)     Physical Exam General appearance: alert, well developed, well nourished, cooperative and in no distress Head: Normocephalic, without obvious abnormality, atraumatic Respiratory: Respirations even and unlabored, normal respiratory rate Heart: rate and rhythm normal. No gallop or murmurs noted on exam  Extremities: No gross deformities Skin: Skin color, texture, turgor normal. No rashes seen  Psych: Appropriate mood and affect. Neurologic: Mental status: Alert, oriented to person, place, and time, thought content appropriate. No results found for: POCGLU  Lab Results  Component Value Date   HGBA1C 5.9 (H) 03/28/2019      Assessment & Plan:  1. Essential hypertension, not well controlled today however patient recently took medication moments before appointment today.  Previous readings collected at her other medical appointments have reflected good control of blood pressure.  For now we will continue amlodipine 10 mg.  Reinforced the importance of weight loss in relation to maintaining  good blood pressure control. -AmLODipine (NORVASC) 10 MG tablet; Take 1 tablet (10 mg total) by mouth daily.    2. Hyperlipidemia, unspecified hyperlipidemia type -Continue Atorvastatin -Congratulated on new efforts to lose weight.  Reinforced the importance of achieving good control of abnormal cholesterol through diet and exercise. - Lipid panel - Hepatic Function Panel   Patient to return in 3 months for hypertension follow-up in 6 months if blood pressure is well controlled.   Molli Barrows, FNP Primary Care at [facility] [address] [place] [phone]: [phone]

## 2019-05-31 NOTE — Patient Instructions (Addendum)
Colorectal Cancer Screening  Colorectal cancer screening is a group of tests that are used to check for colorectal cancer before symptoms develop. Colorectal refers to the colon and rectum. The colon and rectum are located at the end of the digestive tract and carry bowel movements out of the body. Who should have screening? All adults starting at age 52 until age 87 should have screening. Your health care provider may recommend screening at age 71. You will have tests every 1-10 years, depending on your results and the type of screening test. You may have screening tests starting at an earlier age, or more frequently than other people, if you have any of the following risk factors:  A personal or family history of colorectal cancer or abnormal growths (polyps).  Inflammatory bowel disease, such as ulcerative colitis or Crohn's disease.  A history of having radiation treatment to the abdomen or pelvic area for cancer.  Colorectal cancer symptoms, such as changes in bowel habits or blood in your stool.  A type of colon cancer syndrome that is passed from parent to child (hereditary), such as: ? Lynch syndrome. ? Familial adenomatous polyposis. ? Turcot syndrome. ? Peutz-Jeghers syndrome. Screening recommendations for adults who are 72-48 years old vary depending on health. How is screening done? There are several types of colorectal screening tests. You may have one or more of the following:  Guaiac-based fecal occult blood testing. For this test, a stool (feces) sample is checked for hidden (occult) blood, which could be a sign of colorectal cancer.  Fecal immunochemical test (FIT). For this test, a stool sample is checked for blood, which could be a sign of colorectal cancer.  Stool DNA test. For this test, a stool sample is checked for blood and changes in DNA that could lead to colorectal cancer.  Sigmoidoscopy. During this test, a thin, flexible tube with a camera on the end  (sigmoidoscope) is used to examine the rectum and the lower colon.  Colonoscopy. During this test, a long, flexible tube with a camera on the end (colonoscope) is used to examine the entire colon and rectum. With a colonoscopy, it is possible to take a sample of tissue (biopsy) and remove small polyps during the test.  Virtual colonoscopy. Instead of a colonoscope, this type of colonoscopy uses X-rays (CT scan) and computers to produce images of the colon and rectum. What are the benefits of screening? Screening reduces your risk for colorectal cancer and can help identify cancer at an early stage, when the cancer can be removed or treated more easily. It is common for polyps to form in the lining of the colon, especially as you age. These polyps may be cancerous or become cancerous over time. Screening can identify these polyps. What are the risks of screening? Each screening test may have different risks.  Stool sample tests have fewer risks than other types of screening tests. However, you may need more tests to confirm results from a stool sample test.  Screening tests that involve X-rays expose you to low levels of radiation, which may slightly increase your cancer risk. The benefit of detecting cancer outweighs the slight increase in risk.  Screening tests such as sigmoidoscopy and colonoscopy may place you at risk for bleeding, intestinal damage, infection, or a reaction to medicines given during the exam. Talk with your health care provider to understand your risk for colorectal cancer and to make a screening plan that is right for you. Questions to ask your health care  provider  When should I start colorectal cancer screening?  What is my risk for colorectal cancer?  How often do I need screening?  Which screening tests do I need?  How do I get my test results?  What do my results mean? Where to find more information Learn more about colorectal cancer screening from:  The  [place]: www.cancer.org  The [employer]: www.cancer.gov Summary  Colorectal cancer screening is a group of tests used to check for colorectal cancer before symptoms develop.  Screening reduces your risk for colorectal cancer and can help identify cancer at an early stage, when the cancer can be removed or treated more easily.  All adults starting at age 54 until age 35 should have screening. Your health care provider may recommend screening at age 35.  You may have screening tests starting at an earlier age, or more frequently than other people, if you have certain risk factors.  Talk with your health care provider to understand your risk for colorectal cancer and to make a screening plan that is right for you. This information is not intended to replace advice given to you by your health care provider. Make sure you discuss any questions you have with your health care provider. Document Released: 04/30/2010 Document Revised: 03/02/2019 Document Reviewed: 08/12/2017 Elsevier Patient Education  2020 [employer]. Exercising to Lose Weight Exercise is structured, repetitive physical activity to improve fitness and health. Getting regular exercise is important for everyone. It is especially important if you are overweight. Being overweight increases your risk of heart disease, stroke, diabetes, high blood pressure, and several types of cancer. Reducing your calorie intake and exercising can help you lose weight. Exercise is usually categorized as moderate or vigorous intensity. To lose weight, most people need to do a certain amount of moderate-intensity or vigorous-intensity exercise each week. Moderate-intensity exercise  Moderate-intensity exercise is any activity that gets you moving enough to burn at least three times more energy (calories) than if you were sitting. Examples of moderate exercise include:  Walking a mile in 15 minutes.  Doing light yard  work.  Biking at an easy pace. Most people should get at least 150 minutes (2 hours and 30 minutes) a week of moderate-intensity exercise to maintain their body weight. Vigorous-intensity exercise Vigorous-intensity exercise is any activity that gets you moving enough to burn at least six times more calories than if you were sitting. When you exercise at this intensity, you should be working hard enough that you are not able to carry on a conversation. Examples of vigorous exercise include:  Running.  Playing a team sport, such as football, basketball, and soccer.  Jumping rope. Most people should get at least 75 minutes (1 hour and 15 minutes) a week of vigorous-intensity exercise to maintain their body weight. How can exercise affect me? When you exercise enough to burn more calories than you eat, you lose weight. Exercise also reduces body fat and builds muscle. The more muscle you have, the more calories you burn. Exercise also:  Improves mood.  Reduces stress and tension.  Improves your overall fitness, flexibility, and endurance.  Increases bone strength. The amount of exercise you need to lose weight depends on:  Your age.  The type of exercise.  Any health conditions you have.  Your overall physical ability. Talk to your health care provider about how much exercise you need and what types of activities are safe for you. What actions can I take to lose  weight? Nutrition   Make changes to your diet as told by your health care provider or diet and nutrition specialist (dietitian). This may include: ? Eating fewer calories. ? Eating more protein. ? Eating less unhealthy fats. ? Eating a diet that includes fresh fruits and vegetables, whole grains, low-fat dairy products, and lean protein. ? Avoiding foods with added fat, salt, and sugar.  Drink plenty of water while you exercise to prevent dehydration or heat stroke. Activity  Choose an activity that you enjoy and  set realistic goals. Your health care provider can help you make an exercise plan that works for you.  Exercise at a moderate or vigorous intensity most days of the week. ? The intensity of exercise may vary from person to person. You can tell how intense a workout is for you by paying attention to your breathing and heartbeat. Most people will notice their breathing and heartbeat get faster with more intense exercise.  Do resistance training twice each week, such as: ? Push-ups. ? Sit-ups. ? Lifting weights. ? Using resistance bands.  Getting short amounts of exercise can be just as helpful as long structured periods of exercise. If you have trouble finding time to exercise, try to include exercise in your daily routine. ? Get up, stretch, and walk around every 30 minutes throughout the day. ? Go for a walk during your lunch break. ? Park your car farther away from your destination. ? If you take public transportation, get off one stop early and walk the rest of the way. ? Make phone calls while standing up and walking around. ? Take the stairs instead of elevators or escalators.  Wear comfortable clothes and shoes with good support.  Do not exercise so much that you hurt yourself, feel dizzy, or get very short of breath. Where to find more information  U.S. Department of Health and Human Services: [employer].at  Centers for Disease Control and Prevention (CDC): http://www.wolf.info/ Contact a health care provider:  Before starting a new exercise program.  If you have questions or concerns about your weight.  If you have a medical problem that keeps you from exercising. Get help right away if you have any of the following while exercising:  Injury.  Dizziness.  Difficulty breathing or shortness of breath that does not go away when you stop exercising.  Chest pain.  Rapid heartbeat. Summary  Being overweight increases your risk of heart disease, stroke, diabetes, high blood  pressure, and several types of cancer.  Losing weight happens when you burn more calories than you eat.  Reducing the amount of calories you eat in addition to getting regular moderate or vigorous exercise each week helps you lose weight. This information is not intended to replace advice given to you by your health care provider. Make sure you discuss any questions you have with your health care provider. Document Released: 12/13/2010 Document Revised: 11/23/2017 Document Reviewed: 11/23/2017 Elsevier Patient Education  2020 [employer].

## 2019-05-31 NOTE — Progress Notes (Deleted)
Patient ID: Darrell Conway, male    DOB: May 23, 1966, 53 y.o.   MRN: [id number]  PCP: Scot Jun, FNP  Chief Complaint  Patient presents with  . Hypertension  . Hyperlipidemia    Subjective:  HPI  Darrell Conway is a 53 y.o. male presents for evaluation   has Arthralgia of multiple joints; Pain of both hip joints; Bilateral primary osteoarthritis of knee; Primary osteoarthritis of both hips; Obstructive sleep apnea of adult; and Laryngopharyngeal reflux (LPR) on their problem list.   Today's visit:  Social History   Socioeconomic History  . Marital status: Married    Spouse name: Not on file  . Number of children: Not on file  . Years of education: Not on file  . Highest education level: Not on file  Occupational History  . Not on file  Social Needs  . Financial resource strain: Not on file  . Food insecurity    Worry: Not on file    Inability: Not on file  . Transportation needs    Medical: Not on file    Non-medical: Not on file  Tobacco Use  . Smoking status: Former Smoker    Packs/day: 0.75    Types: Cigarettes    Quit date: 2015    Years since quitting: 5.5  . Smokeless tobacco: Never Used  Substance and Sexual Activity  . Alcohol use: Not Currently    Frequency: Never  . Drug use: No  . Sexual activity: Not on file  Lifestyle  . Physical activity    Days per week: Not on file    Minutes per session: Not on file  . Stress: Not on file  Relationships  . Social Herbalist on phone: Not on file    Gets together: Not on file    Attends religious service: Not on file    Active member of club or organization: Not on file    Attends meetings of clubs or organizations: Not on file    Relationship status: Not on file  . Intimate partner violence    Fear of current or ex partner: Not on file    Emotionally abused: Not on file    Physically abused: Not on file    Forced sexual activity: Not on file  Other Topics Concern  . Not on file   Social History Narrative  . Not on file    Family History  Problem Relation Age of Onset  . Rheum arthritis Mother   . Rheum arthritis Sister      Review of Systems  No Known Allergies  Prior to Admission medications   Medication Sig Start Date End Date Taking? Authorizing Provider  amLODipine (NORVASC) 10 MG tablet Take 1 tablet (10 mg total) by mouth daily. 05/31/19  Yes Scot Jun, FNP  atorvastatin (LIPITOR) 40 MG tablet Take 1 tablet (40 mg total) by mouth daily. 04/04/19  Yes Scot Jun, FNP  gabapentin (NEURONTIN) 300 MG capsule Take 1 capsule (300 mg total) by mouth at bedtime as needed and may repeat dose one time if needed. 03/21/19  Yes Scot Jun, FNP  HYDROcodone-acetaminophen (NORCO/VICODIN) 5-325 MG tablet Take 1 tablet by mouth every 6 (six) hours as needed for moderate pain. 05/18/19  Yes Garald Balding, MD  lisinopril (ZESTRIL) 20 MG tablet Take 1 tablet (20 mg total) by mouth daily. 03/21/19 12/16/19 Yes Scot Jun, FNP  Multiple Vitamins-Minerals (MENS MULTIVITAMIN PLUS PO) Take 1 tablet by mouth daily.  Yes [provider]  pantoprazole (PROTONIX) 40 MG tablet One pill(40mg ) 30 minutes before breakfast and one pill 30 minutes before dinner. 05/31/19  Yes Scot Jun, FNP    Past Medical, Surgical Family and Social History reviewed and updated.    Objective:   Today's Vitals   05/31/19 0834  BP: (!) 148/88  Pulse: 69  Resp: 17  Temp: (!) 97.3 F (36.3 C)  TempSrc: Temporal  SpO2: 95%  Weight: (!) 301 lb 9.6 oz (136.8 kg)  Height: 5\' 11"  (1.803 m)    BP Readings from Last 3 Encounters:  05/31/19 (!) 148/88  05/10/19 131/82  05/05/19 (!) 133/91    Filed Weights   05/31/19 0834  Weight: (!) 301 lb 9.6 oz (136.8 kg)       Physical Exam General appearance: alert, well developed, well nourished, cooperative and in no distress Head: Normocephalic, without obvious abnormality, atraumatic Respiratory:  Respirations even and unlabored, normal respiratory rate Heart: rate and rhythm normal. No gallop or murmurs noted on exam  Extremities: No gross deformities Skin: Skin color, texture, turgor normal. No rashes seen  Psych: Appropriate mood and affect. Neurologic: Mental status: Alert, oriented to person, place, and time, thought content appropriate.  No results found for: POCGLU  Lab Results  Component Value Date   HGBA1C 5.9 (H) 03/28/2019            Assessment & Plan:  1. Medication refill *** - amLODipine (NORVASC) 10 MG tablet; Take 1 tablet (10 mg total) by mouth daily.  Dispense: 90 tablet; Refill: 1  2. Essential hypertension *** - amLODipine (NORVASC) 10 MG tablet; Take 1 tablet (10 mg total) by mouth daily.  Dispense: 90 tablet; Refill: Alba, FNP Primary Care at [facility] [address], [place] [phone]: [phone]

## 2019-06-01 LAB — HEPATIC FUNCTION PANEL
ALT: 43 IU/L (ref 0–44)
AST: 28 IU/L (ref 0–40)
Albumin: 4.4 g/dL (ref 3.8–4.9)
Alkaline Phosphatase: 107 IU/L (ref 39–117)
Bilirubin Total: 0.8 mg/dL (ref 0.0–1.2)
Bilirubin, Direct: 0.21 mg/dL (ref 0.00–0.40)
Total Protein: 6.8 g/dL (ref 6.0–8.5)

## 2019-06-01 LAB — LIPID PANEL
Chol/HDL Ratio: 3.8 ratio (ref 0.0–5.0)
Cholesterol, Total: 145 mg/dL (ref 100–199)
HDL: 38 mg/dL — ABNORMAL LOW (ref >39)
LDL Calculated: 89 mg/dL (ref 0–99)
Triglycerides: 89 mg/dL (ref 0–149)
VLDL Cholesterol Cal: 18 mg/dL (ref 5–40)

## 2019-06-02 ENCOUNTER — Ambulatory Visit: Payer: BLUE CROSS/BLUE SHIELD | Admitting: Orthopaedic Surgery

## 2019-06-02 ENCOUNTER — Telehealth: Payer: Self-pay | Admitting: Orthopaedic Surgery

## 2019-06-02 NOTE — Telephone Encounter (Signed)
Patient wants to know what he should take?

## 2019-06-02 NOTE — Telephone Encounter (Signed)
Patient called requesting prescription refill of Hydrocodone to be sent to [facility].

## 2019-06-02 NOTE — Telephone Encounter (Signed)
Called patient to let him know that his insurance [employer] local is a [facility] and that we are out of network.  Patient states he scheduled his MRI on 06/18/19 and doesn't mind "spending a couple of hundred dollars" but if it is more than that he would prefer to be sent to a facility in network with his insurance.  Patient states that if his MRI shows that he needs surgery he would also like Dr. Durward Fortes to refer him to an orthopedic surgeon in network with his insurance.  Please advise.

## 2019-06-02 NOTE — Telephone Encounter (Signed)
Please advise 

## 2019-06-02 NOTE — Telephone Encounter (Signed)
I called patient 

## 2019-06-02 NOTE — Telephone Encounter (Signed)
Please see below.

## 2019-06-02 NOTE — Progress Notes (Signed)
Patient notified of results & recommendations. Expressed understanding.

## 2019-06-02 NOTE — Telephone Encounter (Signed)
Needs to wait at least another week for refill-last given 6/24

## 2019-06-03 NOTE — Telephone Encounter (Signed)
Several thoughts-pain clinic evaluation, check with primary care re use of NSAID's, ? Gabapentin from primary care. Might be worth a call to his physician

## 2019-06-06 ENCOUNTER — Other Ambulatory Visit: Payer: Self-pay | Admitting: Orthopaedic Surgery

## 2019-06-06 ENCOUNTER — Telehealth: Payer: Self-pay | Admitting: Orthopaedic Surgery

## 2019-06-06 MED ORDER — HYDROCODONE-ACETAMINOPHEN 5-325 MG PO TABS: 1.0000 | ORAL_TABLET | Freq: Four times a day (QID) | ORAL | 0 refills | Status: DC | PRN

## 2019-06-06 NOTE — Telephone Encounter (Signed)
Please advise 

## 2019-06-06 NOTE — Telephone Encounter (Addendum)
Thanks for the update-  I actually referred the patient to pain management. Our office doesn't manage any form of chronic pain management with opioid medication. I had this discussion with Mr. Darrell Conway previously and that was the reason he was referred to pain management. If he follow-up here, I will start him on Meloxicam and continue Gabapentin and recommend that he establishes with pain management. Thanks again for looping me in with the conversation.  Thanks- Molli Barrows, FNP

## 2019-06-06 NOTE — Telephone Encounter (Signed)
Please see Dr. Rudene Anda note regarding pain medication. 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 Patient is aware Dr. Durward Fortes cannot continue to prescribe pain medications. Patient is aware PCP will be contacted regarding pain medication evaluation. Thank you.

## 2019-06-06 NOTE — Telephone Encounter (Signed)
Patient called stating he is in a lot of pain and frustrated that nothing has been able to give him any pain relief.  Patient states his hips, knees, and back are all painful and is he not able to sleep.  Patient states the injection that he received did not help.    Patient states he is scheduled for MRI at [facility] at the end of the month.  Patient is requesting a prescription of Hydrocodone and a return call.

## 2019-06-06 NOTE — Telephone Encounter (Signed)
Please see below.

## 2019-06-06 NOTE — Telephone Encounter (Signed)
Sent in new prescription-please call

## 2019-06-07 NOTE — Telephone Encounter (Signed)
Thanks a lot for contacting above

## 2019-06-09 ENCOUNTER — Telehealth: Payer: Self-pay | Admitting: Family Medicine

## 2019-06-09 DIAGNOSIS — M2550 Pain in unspecified joint: Secondary | ICD-10-CM

## 2019-06-09 DIAGNOSIS — G8929 Other chronic pain: Secondary | ICD-10-CM

## 2019-06-09 DIAGNOSIS — M160 Bilateral primary osteoarthritis of hip: Secondary | ICD-10-CM

## 2019-06-09 DIAGNOSIS — M25551 Pain in right hip: Secondary | ICD-10-CM

## 2019-06-09 NOTE — Telephone Encounter (Signed)
Patient wants to go to pain management

## 2019-06-09 NOTE — Telephone Encounter (Signed)
Notify patient that I will place the referral to [facility] Pain management in [facility] as his insurance per prior referral notes will only be accepted at [facility] facilities. Pain management referrals may take up to 4-6 weeks to process as they review all prior records and this referral is out side of [facility].  This will not be processed today given that this is a phone request. Please allow 72 hours for referral to be submitted.

## 2019-06-09 NOTE — Telephone Encounter (Signed)
Called the patient

## 2019-06-14 NOTE — Telephone Encounter (Signed)
Placed referral for Pain management. Please see referral noes and attach all listed documents. [facility] regarding there referral process as this is an external request.

## 2019-06-15 ENCOUNTER — Ambulatory Visit (HOSPITAL_BASED_OUTPATIENT_CLINIC_OR_DEPARTMENT_OTHER): Payer: BLUE CROSS/BLUE SHIELD | Attending: Family Medicine | Admitting: Internal Medicine

## 2019-06-15 ENCOUNTER — Other Ambulatory Visit: Payer: Self-pay

## 2019-06-15 DIAGNOSIS — G4733 Obstructive sleep apnea (adult) (pediatric): Secondary | ICD-10-CM

## 2019-06-17 ENCOUNTER — Telehealth: Payer: Self-pay | Admitting: Orthopaedic Surgery

## 2019-06-17 ENCOUNTER — Other Ambulatory Visit: Payer: Self-pay | Admitting: Orthopaedic Surgery

## 2019-06-17 NOTE — Telephone Encounter (Signed)
Pt is also suppose to have MRI pelvis did that order get sent to [facility] imaging?

## 2019-06-17 NOTE — Telephone Encounter (Signed)
Please advise 

## 2019-06-17 NOTE — Telephone Encounter (Signed)
I called patient, MRI for lumbar spine faxed to [facility] at [phone]. I advised patient to call to schedule appt. Will he need an authorization? Thank you.

## 2019-06-17 NOTE — Telephone Encounter (Signed)
Patient calling in reference to MRI that has not been scheduled. Patient has had MRI in the past at [facility] that was covered under his insurance, and patient would like to go back there for MRI if possible. Patient states he was told insurance will not cover him going to [place], and that someone was to find out where he could go and call him back. Per patient, no one has called him back. Please call to advise.

## 2019-06-17 NOTE — Telephone Encounter (Signed)
He is authorized for [facility] imaging

## 2019-06-17 NOTE — Telephone Encounter (Signed)
The pelvis MRI is complete from [facility]. Thank you so much!

## 2019-06-18 ENCOUNTER — Other Ambulatory Visit: Payer: BLUE CROSS/BLUE SHIELD

## 2019-06-18 DIAGNOSIS — G4733 Obstructive sleep apnea (adult) (pediatric): Secondary | ICD-10-CM

## 2019-06-18 NOTE — Procedures (Signed)
    Patient Name: Darrell Conway, Darrell Conway Date: 06/15/2019 Gender: Male D.O.B: 1966/04/01 Age (years): 53 Referring Provider: Molli Barrows FNP Height (inches): 90 Interpreting Physician: Baird Lyons MD, ABSM Weight (lbs): 300 RPSGT: Jacolyn Reedy BMI: 41 MRN: [id number] Neck Size: 18.50  CLINICAL INFORMATION Sleep Study Type: HST Indication for sleep study: OSA Epworth Sleepiness Score: 0  SLEEP STUDY TECHNIQUE A multi-channel overnight portable sleep study was performed. The channels recorded were: nasal airflow, thoracic respiratory movement, and oxygen saturation with a pulse oximetry. Snoring was also monitored.  MEDICATIONS Patient self administered medications include: none reported.  SLEEP ARCHITECTURE Patient was studied for 494.6 minutes. The sleep efficiency was 100.0 % and the patient was supine for 30%. The arousal index was 0.0 per hour.  RESPIRATORY PARAMETERS The overall AHI was 17.3 per hour, with a central apnea index of 0.0 per hour. The oxygen nadir was 79% during sleep.  CARDIAC DATA Mean heart rate during sleep was 71.6 bpm.  IMPRESSIONS - Moderate obstructive sleep apnea occurred during this study (AHI = 17.3/h). - No significant central sleep apnea occurred during this study (CAI = 0.0/h). - Oxygen desaturation was noted during this study (Min O2 = 79%). Mean sat 94%. - Patient snored . DIAGNOSIS - Obstructive Sleep Apnea (327.23 [G47.33 ICD-10])  RECOMMENDATIONS - Suggest CPAP titration sleep study or autopap. Other options would be based on clincal judgment. - Be careful with alcohol, sedatives and other CNS depressants that may worsen sleep apnea and disrupt normal sleep architecture. - Sleep hygiene should be reviewed to assess factors that may improve sleep quality. - Weight management and regular exercise should be initiated or continued.  [Electronically signed] 06/18/2019 09:51 AM  Baird Lyons MD, ABSM Diplomate, American  Board of Sleep Medicine   NPI: [id number]                         [place], [place] [facility]  ELECTRONICALLY SIGNED ON:  06/18/2019, 9:47 AM [place] PH: [phone]   FX: [phone] [place]

## 2019-06-21 ENCOUNTER — Other Ambulatory Visit: Payer: Self-pay | Admitting: Internal Medicine

## 2019-06-21 ENCOUNTER — Ambulatory Visit: Payer: BLUE CROSS/BLUE SHIELD | Admitting: Orthopaedic Surgery

## 2019-06-21 DIAGNOSIS — G4733 Obstructive sleep apnea (adult) (pediatric): Secondary | ICD-10-CM

## 2019-06-21 NOTE — Progress Notes (Signed)
Patient notified of results & recommendations. Expressed understanding. Aware someone will be reaching out to him about the CPAP titration.

## 2019-06-29 ENCOUNTER — Other Ambulatory Visit: Payer: Self-pay

## 2019-06-29 ENCOUNTER — Encounter: Payer: Self-pay | Admitting: Orthopaedic Surgery

## 2019-06-29 ENCOUNTER — Ambulatory Visit (INDEPENDENT_AMBULATORY_CARE_PROVIDER_SITE_OTHER): Payer: BLUE CROSS/BLUE SHIELD | Admitting: Orthopaedic Surgery

## 2019-06-29 VITALS — BP 155/99 | HR 82 | Ht 72.0 in | Wt 300.0 lb

## 2019-06-29 DIAGNOSIS — M25552 Pain in left hip: Secondary | ICD-10-CM | POA: Diagnosis not present

## 2019-06-29 DIAGNOSIS — M170 Bilateral primary osteoarthritis of knee: Secondary | ICD-10-CM

## 2019-06-29 DIAGNOSIS — M25551 Pain in right hip: Secondary | ICD-10-CM

## 2019-06-29 NOTE — Progress Notes (Signed)
Office Visit Note   Patient: Darrell Conway           Date of Birth: 1966/01/11           MRN: [id number] Visit Date: 06/29/2019              Requested by: Scot Jun, [address],  [place] [address] PCP: Scot Jun, FNP   Assessment & Plan: Visit Diagnoses:  1. Bilateral primary osteoarthritis of knee   2. Pain of both hip joints     Plan: Mr. Pautsch has been followed for many months for multiple joint complaints.  To date he has had an MRI scan of his pelvis demonstrating mild degenerative arthritis in both of his hips with probable tear of the labrum on the left.  He has had a cortisone injection that actually made it "worse".  He has seen Dr. Estanislado Pandy for evaluation of possible rheumatoid arthritis.  It was felt that he did not have RA but rather just osteoarthritis of multiple joints.  He has had pain out of proportion of what I would expect based on his complaints and have referred him to the pain clinic.  He has an appointment to speak with them this afternoon but has not seen them formally.  He also has had an issue with his back.  I just had an MRI scan performed through [facility] demonstrating some mild degenerative changes but without any type of foraminal or central stenosis.  No lesions identified in the upper sacrum.  Mr. Landin is frustrated with his pain we had a discussion well over 30 minutes regarding all of the above and I summarized my care my thought process he is just frustrated because he is having so much trouble does not feel like there were making any progress.  I do not think there is any issue with his back daily causing any of his lower extremity pain.  I believe his biggest problem is with his left hip with the protrusio and the mild arthritis.  He did not do well with a cortisone injection.  He might be a candidate for hip replacement but he is overweight with a BMI of over 40.  He needs to work on his weight and we  had a long discussion regarding weight loss.  We talked about using over-the-counter anti-inflammatory medicines.  In summary he has multiple joint complaints probably related to osteoarthritis.  He will continue with over-the-counter medicines.  He will follow-up with the pain clinic as he is having pain out of proportion of what I would expect from findings on his MRI scans of his pelvis and his back.  I think his biggest issue is with his hip and will have Dr. Ninfa Linden evaluate him release establish contact with them as he may not be a candidate for surgery with his BMI at its present level.  Follow-Up Instructions: Return Appointment with Dr. Ninfa Linden for consideration of left total hip replacement.   Orders:  No orders of the defined types were placed in this encounter.  No orders of the defined types were placed in this encounter.     Procedures: No procedures performed   Clinical Data: No additional findings.   Subjective: Chief Complaint  Patient presents with   Lower Back - Follow-up  Patient presents today for follow up on his lower back. No changes since his last visit. He had an MRI done on 06/24/2019 and is here today for those  results. He has multiple body complaints (bilateral hips, knees, elbow, and lower back) and is frustrated because he continues to hurt.  HPI  Review of Systems   Objective: Vital Signs: BP (!) 155/99    Pulse 82    Ht 6' (1.829 m)    Wt 300 lb (136.1 kg)    BMI 40.69 kg/m   Physical Exam Constitutional:      Appearance: He is well-developed.  Eyes:     Pupils: Pupils are equal, round, and reactive to light.  Pulmonary:     Effort: Pulmonary effort is normal.  Skin:    General: Skin is warm and dry.  Neurological:     Mental Status: He is alert and oriented to person, place, and time.  Psychiatric:        Behavior: Behavior normal.     Ortho Exam awake alert and oriented x3.  Comfortable sitting.  He does have limitation of  motion of his left hip with pain with internal and external rotation.  He cannot bend his left leg over his right knee because of his hip pain.  He did not have any effusion of either knee but has had in the past particularly on the right.  Painless range of motion of his right hip.  Straight leg raise negative.  Motor exam intact.  Specialty Comments:  No specialty comments available.  Imaging: No results found.   PMFS History: Patient Active Problem List   Diagnosis Date Noted   Bilateral primary osteoarthritis of knee 05/05/2019   Primary osteoarthritis of both hips    Arthralgia of multiple joints 04/26/2019   Pain of both hip joints 04/26/2019   Obstructive sleep apnea of adult 04/11/2019   Laryngopharyngeal reflux (LPR) 04/11/2019   Past Medical History:  Diagnosis Date   Anemia    GERD (gastroesophageal reflux disease)    Gout    Hypertension    Rheumatoid arthritis ([place])     Family History  Problem Relation Age of Onset   Rheum arthritis Mother    Rheum arthritis Sister     Past Surgical History:  Procedure Laterality Date   HERNIA REPAIR     Social History   Occupational History   Not on file  Tobacco Use   Smoking status: Former Smoker    Packs/day: 0.75    Types: Cigarettes    Quit date: 2015    Years since quitting: 5.5   Smokeless tobacco: Never Used  Substance and Sexual Activity   Alcohol use: Not Currently    Frequency: Never   Drug use: No   Sexual activity: Not on file

## 2019-06-30 ENCOUNTER — Other Ambulatory Visit: Payer: Self-pay | Admitting: Internal Medicine

## 2019-06-30 ENCOUNTER — Telehealth: Payer: Self-pay | Admitting: Family Medicine

## 2019-06-30 DIAGNOSIS — M160 Bilateral primary osteoarthritis of hip: Secondary | ICD-10-CM

## 2019-06-30 NOTE — Telephone Encounter (Signed)
Patient would like to see a different provider about his hip he doesn't feel like the place we sent understands the pain or is trying to help fix the problem

## 2019-07-01 ENCOUNTER — Other Ambulatory Visit: Payer: Self-pay

## 2019-07-01 ENCOUNTER — Ambulatory Visit
Admission: EM | Admit: 2019-07-01 | Discharge: 2019-07-01 | Disposition: A | Discharge status: Home / Self Care | Payer: BLUE CROSS/BLUE SHIELD | Attending: Physician Assistant | Admitting: Physician Assistant

## 2019-07-01 DIAGNOSIS — I10 Essential (primary) hypertension: Secondary | ICD-10-CM | POA: Diagnosis not present

## 2019-07-01 DIAGNOSIS — M25552 Pain in left hip: Secondary | ICD-10-CM

## 2019-07-01 DIAGNOSIS — M109 Gout, unspecified: Secondary | ICD-10-CM | POA: Diagnosis not present

## 2019-07-01 DIAGNOSIS — G8929 Other chronic pain: Secondary | ICD-10-CM | POA: Diagnosis not present

## 2019-07-01 MED ORDER — KETOROLAC TROMETHAMINE 30 MG/ML IJ SOLN
30.0000 mg | Freq: Once | INTRAMUSCULAR | Status: AC
  Administered 2019-07-01: 30 mg via INTRAMUSCULAR

## 2019-07-01 MED ORDER — MELOXICAM 7.5 MG PO TABS: 7.5000 mg | ORAL_TABLET | Freq: Every day | ORAL | 0 refills | Status: DC

## 2019-07-01 MED ORDER — PREDNISONE 50 MG PO TABS: 50.0000 mg | ORAL_TABLET | Freq: Every day | ORAL | 0 refills | Status: DC

## 2019-07-01 NOTE — ED Provider Notes (Signed)
[facility]    CSN: [id number] Arrival date & time: 07/01/19  1356     History   Chief Complaint Chief Complaint  Patient presents with  . Hip Pain    HPI Darrell Conway is a 53 y.o. male.   53 year old male comes in for chronic left hip pain/left arm pain due to arthritis as well as right foot gout flareup.  Patient was seen by orthopedics and pain management 2 days ago for the symptoms.  He was started on Topamax, but states is not helping symptoms.  He is trying to get another referral to a different pain management clinic.  States he was told he needed surgery for the left hip, but needs to lose 25 pounds prior to surgery.  He will be meeting up with possible surgeon in a few weeks for assessment.  He has right great toe swelling, erythema, warmth, pain.  States has history of gout with similar symptoms.  He denies spreading erythema, warmth, fever.  He used to take colchicine for symptoms.     Past Medical History:  Diagnosis Date  . Anemia   . GERD (gastroesophageal reflux disease)   . Gout   . Hypertension   . Rheumatoid arthritis [facility])     Patient Active Problem List   Diagnosis Date Noted  . Morbid (severe) obesity due to excess calories ([place]) 06/29/2019  . Bilateral primary osteoarthritis of knee 05/05/2019  . Primary osteoarthritis of both hips   . Arthralgia of multiple joints 04/26/2019  . Pain of both hip joints 04/26/2019  . Obstructive sleep apnea of adult 04/11/2019  . Laryngopharyngeal reflux (LPR) 04/11/2019    Past Surgical History:  Procedure Laterality Date  . HERNIA REPAIR         Home Medications    Prior to Admission medications   Medication Sig Start Date End Date Taking? Authorizing Provider  amLODipine (NORVASC) 10 MG tablet Take 1 tablet (10 mg total) by mouth daily. 05/31/19  Yes Scot Jun, FNP  atorvastatin (LIPITOR) 40 MG tablet Take 1 tablet (40 mg total) by mouth daily. 04/04/19  Yes Scot Jun, FNP   gabapentin (NEURONTIN) 300 MG capsule Take 1 capsule (300 mg total) by mouth at bedtime as needed and may repeat dose one time if needed. 03/21/19  Yes Scot Jun, FNP  lisinopril (ZESTRIL) 20 MG tablet Take 1 tablet (20 mg total) by mouth daily. 03/21/19 12/16/19 Yes Scot Jun, FNP  Multiple Vitamins-Minerals (MENS MULTIVITAMIN PLUS PO) Take 1 tablet by mouth daily.   Yes [provider]  pantoprazole (PROTONIX) 40 MG tablet One pill(40mg ) 30 minutes before breakfast and one pill 30 minutes before dinner. 05/31/19  Yes Scot Jun, FNP  meloxicam (MOBIC) 7.5 MG tablet Take 1 tablet (7.5 mg total) by mouth daily. 07/01/19   Tasia Catchings, Zohair Epp V, PA-C  predniSONE (DELTASONE) 50 MG tablet Take 1 tablet (50 mg total) by mouth daily with breakfast. 07/01/19   Ok Edwards, PA-C    Family History Family History  Problem Relation Age of Onset  . Rheum arthritis Mother   . Rheum arthritis Sister     Social History Social History   Tobacco Use  . Smoking status: Former Smoker    Packs/day: 0.75    Types: Cigarettes    Quit date: 2015    Years since quitting: 5.6  . Smokeless tobacco: Never Used  Substance Use Topics  . Alcohol use: Not Currently    Frequency:  Never  . Drug use: No     Allergies   Patient has no known allergies.   Review of Systems Review of Systems  Reason unable to perform ROS: See HPI as above.     Physical Exam Triage Vital Signs ED Triage Vitals  Enc Vitals Group     BP 07/01/19 1404 (!) 148/89     Pulse Rate 07/01/19 1404 95     Resp 07/01/19 1404 18     Temp 07/01/19 1404 97.8 F (36.6 C)     Temp src --      SpO2 07/01/19 1404 94 %     Weight --      Height --      Head Circumference --      Peak Flow --      Pain Score 07/01/19 1405 8     Pain Loc --      Pain Edu? --      Excl. in [place]? --    No data found.  Updated Vital Signs BP (!) 148/89   Pulse 95   Temp 97.8 F (36.6 C)   Resp 18   SpO2 94%   Physical Exam  Constitutional:      General: He is not in acute distress.    Appearance: He is well-developed. He is not diaphoretic.  HENT:     Head: Normocephalic and atraumatic.  Eyes:     Conjunctiva/sclera: Conjunctivae normal.     Pupils: Pupils are equal, round, and reactive to light.  Pulmonary:     Effort: Pulmonary effort is normal. No respiratory distress.  Musculoskeletal:     Comments: Right great toe MTP with swelling, erythema, warmth. Decreased flexion to the toe. Sensation intact. Pedal pulse 2+  Skin:    General: Skin is warm and dry.  Neurological:     Mental Status: He is alert and oriented to person, place, and time.    UC Treatments / Results  Labs (all labs ordered are listed, but only abnormal results are displayed) Labs Reviewed - No data to display  EKG   Radiology No results found.  Procedures Procedures (including critical care time)  Medications Ordered in UC Medications  ketorolac (TORADOL) 30 MG/ML injection 30 mg (30 mg Intramuscular Given 07/01/19 1425)    Initial Impression / Assessment and Plan / UC Course  I have reviewed the triage vital signs and the nursing notes.  Pertinent labs & imaging results that were available during my care of the patient were reviewed by me and considered in my medical decision making (see chart for details).    Discussed given chronic nature of left hip pain, following with pain management, last Norco prescription less than a month ago, would not be able to provide narcotics today.  Toradol injection in office today. Will refill patient's Mobic for chronic pain.  And start prednisone for gout.  Patient to follow-up with orthopedic and pain management for further evaluation of chronic pain.  Return precautions given.  Patient expresses understanding and agrees to plan.  Final Clinical Impressions(s) / UC Diagnoses   Final diagnoses:  Acute gout involving toe of right foot, unspecified cause  Chronic left hip pain     ED Prescriptions    Medication Sig Dispense Auth. Provider   meloxicam (MOBIC) 7.5 MG tablet Take 1 tablet (7.5 mg total) by mouth daily. 15 tablet Jelissa Espiritu V, PA-C   predniSONE (DELTASONE) 50 MG tablet Take 1 tablet (50 mg total) by mouth  daily with breakfast. 5 tablet Tobin Chad, [place] 07/01/19 1536

## 2019-07-01 NOTE — ED Triage Notes (Signed)
Pt presents with complaints of chronic pain in his left hip and left arm due to arthritis. Pt states he is waiting to get in with a doctor for pain medication and is currently out. Pt also reports gout flare up in his right foot.

## 2019-07-01 NOTE — Discharge Instructions (Addendum)
Toradol injection in office today. Start prednisone as directed for gout. I have also refilled Mobic. Follow up with orthopedics/pain management for further evaluation and management needed.

## 2019-07-06 ENCOUNTER — Ambulatory Visit: Payer: BLUE CROSS/BLUE SHIELD | Admitting: Orthopaedic Surgery

## 2019-07-08 ENCOUNTER — Telehealth: Payer: Self-pay | Admitting: Family Medicine

## 2019-07-08 NOTE — Telephone Encounter (Signed)
Please ask patient for clarification regarding to which doctor or office he needs a referral as well as why he needs the referral-what needs to be treated/addressed with the specialist to whom he is asking to be referred

## 2019-07-08 NOTE — Telephone Encounter (Signed)
Please advise 

## 2019-07-08 NOTE — Telephone Encounter (Signed)
Patient needs new referral sent because he cant get pain medication due to how kim sent the referral

## 2019-07-11 ENCOUNTER — Other Ambulatory Visit: Payer: Self-pay | Admitting: Family Medicine

## 2019-07-11 DIAGNOSIS — M160 Bilateral primary osteoarthritis of hip: Secondary | ICD-10-CM

## 2019-07-11 DIAGNOSIS — M2550 Pain in unspecified joint: Secondary | ICD-10-CM

## 2019-07-11 NOTE — Telephone Encounter (Signed)
Notify patient that new referral was placed

## 2019-07-11 NOTE — Telephone Encounter (Signed)
Patient needs a new referral for pain management with [facility] due to his insurance. Diagnoses from previous referrals: bilateral hip pain, chronic pain of both knee, primary osteoarthritis of both hips, arthralgia of multiple joints.  How the referral was worded made it seem like they couldn't give him pain medicine. The order just needs to put in again.

## 2019-07-11 NOTE — Progress Notes (Signed)
Patient ID: Darrell Conway, male   DOB: 01-Jan-1966, 53 y.o.   MRN: [id number]   Patient left message requesting new referral to pain management at [facility].  See phone message.  New referral placed

## 2019-07-19 ENCOUNTER — Telehealth: Payer: Self-pay | Admitting: Family Medicine

## 2019-07-19 NOTE — Telephone Encounter (Signed)
New Message   Received an order for cpap but [employer] is  requesting for it to be reviewed by the physician in the next 24 hours and they spoke with them around [place] number: [phone]. Please f/u with [facility] and call the sleep center once a determination has been made: [phone]

## 2019-07-20 NOTE — Telephone Encounter (Signed)
Spoke with Opal Sidles to get clarification.  [employer] is requiring a peer-to-peer review for the CPAP titration study that was ordered.

## 2019-07-20 NOTE — Telephone Encounter (Signed)
This is a patient of [facility]. Can you please take care of this.

## 2019-07-21 NOTE — Telephone Encounter (Signed)
Nobody else has seen the patient here since Lavell Anchors, FNP-C left. You ordered the CPAP titration study.

## 2019-07-25 NOTE — Telephone Encounter (Signed)
Called number below & requested peer to peer.

## 2019-07-26 NOTE — Telephone Encounter (Signed)
For the peer-to-peer you will call [phone], option 1, option 2, option 2.  You had almost mentioned possibly doing an auto-pap for patient.

## 2019-07-27 ENCOUNTER — Telehealth: Payer: Self-pay | Admitting: Internal Medicine

## 2019-07-27 NOTE — Telephone Encounter (Signed)
-----   Message from Deneise Lever, MD sent at 07/26/2019  8:35 AM EDT ----- Suggest you order the full range for the CPAP machine, which is auto 5-20 cwp. Let the home care company fit the mask- I usually write the order as: "New CPAP auto 5-20, mask of choice, humidifier, supplies, Airview or download card"  If it doesn't go smoothly, we can see the patient and work with him. Darrell Conway  ----- Message ----- From: Ladell Pier, MD Sent: 07/21/2019   9:52 PM EDT To: Deneise Lever, MD  Dr. Annamaria Boots: you read the sleep study that this pt did last mth.  You recommended autopap or sleep titration.  What pressure range for autopap and what type of face mask would you recommend?

## 2019-07-28 ENCOUNTER — Telehealth: Payer: Self-pay

## 2019-07-28 NOTE — Telephone Encounter (Signed)
Patient notified of this information. CPAP order, sleep study results, office note & demographics will be faxed to DME supplier.

## 2019-07-28 NOTE — Telephone Encounter (Signed)
Doctor states that she spoke you yesterday about patient CPAP machine.  She states that there is no insurance approval needed for his machine, a script just needs to be faxed over to [facility].

## 2019-07-29 NOTE — Telephone Encounter (Signed)
CPAP prescription, demographics, sleep study & office note sent to [facility]. They will contact patient about teaching, payment & delivery.

## 2019-08-04 ENCOUNTER — Encounter: Payer: Self-pay | Admitting: Emergency Medicine

## 2019-08-04 ENCOUNTER — Ambulatory Visit
Admission: EM | Admit: 2019-08-04 | Discharge: 2019-08-04 | Disposition: A | Discharge status: Home / Self Care | Payer: BLUE CROSS/BLUE SHIELD | Attending: Physician Assistant | Admitting: Physician Assistant

## 2019-08-04 ENCOUNTER — Other Ambulatory Visit: Payer: Self-pay

## 2019-08-04 DIAGNOSIS — M545 Low back pain, unspecified: Secondary | ICD-10-CM

## 2019-08-04 MED ORDER — PREDNISONE 50 MG PO TABS: 50.0000 mg | ORAL_TABLET | Freq: Every day | ORAL | 0 refills | Status: DC

## 2019-08-04 MED ORDER — MELOXICAM 7.5 MG PO TABS: 7.5000 mg | ORAL_TABLET | Freq: Every day | ORAL | 0 refills | Status: DC

## 2019-08-04 MED ORDER — TIZANIDINE HCL 2 MG PO TABS: 2.0000 mg | ORAL_TABLET | Freq: Four times a day (QID) | ORAL | 0 refills | Status: DC | PRN

## 2019-08-04 MED ORDER — KETOROLAC TROMETHAMINE 30 MG/ML IJ SOLN
30.0000 mg | Freq: Once | INTRAMUSCULAR | Status: AC
  Administered 2019-08-04: 14:00:00 30 mg via INTRAMUSCULAR

## 2019-08-04 NOTE — ED Triage Notes (Signed)
Pt presents to [facility] for assessment after he lifted a door while changing them out.  Patient c/o right sided lower back and back of leg pain.

## 2019-08-04 NOTE — Discharge Instructions (Signed)
Toradol injection in office today. You can continue mobic or ibuprofen for pain. Tizanidine as needed, this can make you drowsy, so do not take if you are going to drive, operate heavy machinery, or make important decisions. Ice/heat compresses as needed. This can take up to 3-4 weeks to completely resolve, but you should be feeling better each week. Follow up with PCP/pain clinic if symptoms worsen, changes for reevaluation. If experience numbness/tingling of the inner thighs, loss of bladder or bowel control, go to the emergency department for evaluation.

## 2019-08-04 NOTE — ED Notes (Signed)
Patient able to ambulate independently  

## 2019-08-04 NOTE — ED Provider Notes (Signed)
[facility]    CSN: [id number] Arrival date & time: 08/04/19  1355      History   Chief Complaint Chief Complaint  Patient presents with  . Back Pain    HPI Darrell Conway is a 53 y.o. male.   53 year old male comes in for right-sided low back pain after heavy lifting.  States was changing out a door, when the door slipped, causing him to change positions.  He felt acute back pain, and came in for evaluation.  Pain is to the right side, and at times can radiate down the leg.  Denies saddle anesthesia, loss of bladder or bowel control.  Has not taken anything for the symptoms.     Past Medical History:  Diagnosis Date  . Anemia   . GERD (gastroesophageal reflux disease)   . Gout   . Hypertension   . Rheumatoid arthritis [facility])     Patient Active Problem List   Diagnosis Date Noted  . Morbid (severe) obesity due to excess calories ([place]) 06/29/2019  . Bilateral primary osteoarthritis of knee 05/05/2019  . Primary osteoarthritis of both hips   . Arthralgia of multiple joints 04/26/2019  . Pain of both hip joints 04/26/2019  . Obstructive sleep apnea of adult 04/11/2019  . Laryngopharyngeal reflux (LPR) 04/11/2019    Past Surgical History:  Procedure Laterality Date  . HERNIA REPAIR         Home Medications    Prior to Admission medications   Medication Sig Start Date End Date Taking? Authorizing Provider  amLODipine (NORVASC) 10 MG tablet Take 1 tablet (10 mg total) by mouth daily. 05/31/19   Scot Jun, FNP  atorvastatin (LIPITOR) 40 MG tablet Take 1 tablet (40 mg total) by mouth daily. 04/04/19   Scot Jun, FNP  gabapentin (NEURONTIN) 300 MG capsule Take 1 capsule (300 mg total) by mouth at bedtime as needed and may repeat dose one time if needed. 03/21/19   Scot Jun, FNP  lisinopril (ZESTRIL) 20 MG tablet Take 1 tablet (20 mg total) by mouth daily. 03/21/19 12/16/19  Scot Jun, FNP  meloxicam (MOBIC) 7.5 MG tablet  Take 1 tablet (7.5 mg total) by mouth daily. 08/04/19   Tasia Catchings, Amy V, PA-C  Multiple Vitamins-Minerals (MENS MULTIVITAMIN PLUS PO) Take 1 tablet by mouth daily.    [provider]  pantoprazole (PROTONIX) 40 MG tablet One pill(40mg ) 30 minutes before breakfast and one pill 30 minutes before dinner. 05/31/19   Scot Jun, FNP  predniSONE (DELTASONE) 50 MG tablet Take 1 tablet (50 mg total) by mouth daily with breakfast. 08/04/19   Tasia Catchings, Amy V, PA-C  tiZANidine (ZANAFLEX) 2 MG tablet Take 1 tablet (2 mg total) by mouth every 6 (six) hours as needed for muscle spasms. 08/04/19   Ok Edwards, PA-C    Family History Family History  Problem Relation Age of Onset  . Rheum arthritis Mother   . Rheum arthritis Sister     Social History Social History   Tobacco Use  . Smoking status: Former Smoker    Packs/day: 0.75    Types: Cigarettes    Quit date: 2015    Years since quitting: 5.6  . Smokeless tobacco: Never Used  Substance Use Topics  . Alcohol use: Not Currently    Frequency: Never  . Drug use: No     Allergies   Patient has no known allergies.   Review of Systems Review of Systems  Reason unable  to perform ROS: See HPI as above.     Physical Exam Triage Vital Signs ED Triage Vitals [08/04/19 1402]  Enc Vitals Group     BP 133/89     Pulse Rate 94     Resp 18     Temp 98.3 F (36.8 C)     Temp Source Oral     SpO2 95 %     Weight      Height      Head Circumference      Peak Flow      Pain Score 10     Pain Loc      Pain Edu?      Excl. in [place]?    No data found.  Updated Vital Signs BP 133/89 (BP Location: Right Arm)   Pulse 94   Temp 98.3 F (36.8 C) (Oral)   Resp 18   SpO2 95%   Physical Exam Constitutional:      General: He is not in acute distress.    Appearance: He is well-developed. He is not diaphoretic.  HENT:     Head: Normocephalic and atraumatic.  Eyes:     Conjunctiva/sclera: Conjunctivae normal.     Pupils: Pupils are equal,  round, and reactive to light.  Cardiovascular:     Rate and Rhythm: Normal rate and regular rhythm.     Heart sounds: Normal heart sounds. No murmur. No friction rub. No gallop.   Pulmonary:     Effort: Pulmonary effort is normal. No accessory muscle usage or respiratory distress.     Breath sounds: Normal breath sounds. No stridor. No decreased breath sounds, wheezing, rhonchi or rales.  Musculoskeletal:     Comments: No tenderness on palpation of the spinous processes.  Diffuse tenderness to right-sided lumbar region.  Decreased range of motion of her back.  Decreased active range of motion of hip due to back pain.  Full passive range of motion of hips, though can still exacerbate back pain.  Negative straight leg raise.  Sensation intact and equal bilaterally.    Skin:    General: Skin is warm and dry.  Neurological:     Mental Status: He is alert and oriented to person, place, and time.    UC Treatments / Results  Labs (all labs ordered are listed, but only abnormal results are displayed) Labs Reviewed - No data to display  EKG   Radiology No results found.  Procedures Procedures (including critical care time)  Medications Ordered in UC Medications  ketorolac (TORADOL) 30 MG/ML injection 30 mg (30 mg Intramuscular Given 08/04/19 1426)    Initial Impression / Assessment and Plan / UC Course  I have reviewed the triage vital signs and the nursing notes.  Pertinent labs & imaging results that were available during my care of the patient were reviewed by me and considered in my medical decision making (see chart for details).    Patient requesting injection to help with his back pain.  Discussed can provide him with Toradol injection.  Patient states he wants something strong, that will make him "have no pain".  Discussed given injury to the back, may take a few weeks to completely resolve, but can provide with medications to help alleviate symptoms.  Discussed may not be  realistic to expect "no pain at all".  Offered Mobic prescription and muscle relaxants.  Patient would also like to add on prednisone.  Prescription was called into pharmacy.  Patient to follow-up with his pain  clinic for further medications.  Return precautions given.  Patient expresses understanding and agrees to plan.    Final Clinical Impressions(s) / UC Diagnoses   Final diagnoses:  Acute right-sided low back pain without sciatica   ED Prescriptions    Medication Sig Dispense Auth. Provider   tiZANidine (ZANAFLEX) 2 MG tablet Take 1 tablet (2 mg total) by mouth every 6 (six) hours as needed for muscle spasms. 20 tablet Yu, Amy V, PA-C   meloxicam (MOBIC) 7.5 MG tablet Take 1 tablet (7.5 mg total) by mouth daily. 15 tablet Yu, Amy V, PA-C   predniSONE (DELTASONE) 50 MG tablet Take 1 tablet (50 mg total) by mouth daily with breakfast. 5 tablet Tobin Chad, [place] 08/04/19 1520

## 2019-08-04 NOTE — ED Notes (Signed)
This RN reviewed discharge instructions for patient and during discharge patient states "I want a steroid.  Tell her to give me a steroid".  APP in another room at this time, will inform her prior to d/c'ing patient.

## 2019-08-17 ENCOUNTER — Other Ambulatory Visit: Payer: Self-pay | Admitting: Nurse Practitioner

## 2019-08-17 ENCOUNTER — Telehealth: Payer: Self-pay | Admitting: Family Medicine

## 2019-08-17 NOTE — Telephone Encounter (Signed)
The referrals are based on who accepts the insurance. He has already been referred by DR. Fulp in August and the pain management clinic is listed under the referral section.

## 2019-08-17 NOTE — Telephone Encounter (Signed)
Patients wife is asking for the pain clinic referral to be to the pain clinic in [place]. Wife says that patient is still in a lot of pain. Patient will be seeing you on 08/31/2019. Last provider he saw was [facility] B[facility].  Please contact patient in regards to what they should do.

## 2019-08-18 NOTE — Telephone Encounter (Signed)
Please send pain clinic referral to a [place] office.

## 2019-08-23 NOTE — Telephone Encounter (Signed)
Sent referral to [facility] for Pain Management Ph. # [phone] Address [address] [place], [place] [address]. They have different locations in [place]

## 2019-08-25 NOTE — Telephone Encounter (Signed)
Please send referral for pain clinic to a [place] location.

## 2019-08-30 ENCOUNTER — Telehealth: Payer: Self-pay

## 2019-08-30 NOTE — Telephone Encounter (Signed)

## 2019-08-31 ENCOUNTER — Other Ambulatory Visit: Payer: Self-pay

## 2019-08-31 ENCOUNTER — Ambulatory Visit (INDEPENDENT_AMBULATORY_CARE_PROVIDER_SITE_OTHER): Payer: BLUE CROSS/BLUE SHIELD | Admitting: Nurse Practitioner

## 2019-08-31 ENCOUNTER — Encounter: Payer: Self-pay | Admitting: Nurse Practitioner

## 2019-08-31 VITALS — BP 116/75 | HR 85 | Temp 97.3°F | Resp 17 | Wt 305.6 lb

## 2019-08-31 DIAGNOSIS — G4733 Obstructive sleep apnea (adult) (pediatric): Secondary | ICD-10-CM | POA: Diagnosis not present

## 2019-08-31 DIAGNOSIS — Z23 Encounter for immunization: Secondary | ICD-10-CM

## 2019-08-31 DIAGNOSIS — E785 Hyperlipidemia, unspecified: Secondary | ICD-10-CM

## 2019-08-31 DIAGNOSIS — Z6841 Body Mass Index (BMI) 40.0 and over, adult: Secondary | ICD-10-CM

## 2019-08-31 DIAGNOSIS — M160 Bilateral primary osteoarthritis of hip: Secondary | ICD-10-CM

## 2019-08-31 DIAGNOSIS — I10 Essential (primary) hypertension: Secondary | ICD-10-CM

## 2019-08-31 NOTE — Progress Notes (Signed)
Assessment & Plan:  Eugean was seen today for hypertension and hyperlipidemia.  Diagnoses and all orders for this visit:  Essential hypertension Continue all antihypertensives as prescribed.  Remember to bring in your blood pressure log with you for your follow up appointment.  DASH/Mediterranean Diets are healthier choices for HTN.    Hyperlipidemia, unspecified hyperlipidemia type INSTRUCTIONS: Work on a low fat, heart healthy diet and participate in regular aerobic exercise program by working out at least 150 minutes per week; 5 days a week-30 minutes per day. Avoid red meat, fried foods. junk foods, sodas, sugary drinks, unhealthy snacking, alcohol and smoking.  Drink at least 48oz of water per day and monitor your carbohydrate intake daily.    OSA (obstructive sleep apnea) Awaiting CPAP. I called [employer] [facility] and they state order was just received and they will be delivering to patient this week.    Primary osteoarthritis of both hips -     Ambulatory referral to Orthopedic Surgery Wants to be evaluated by Dr. Ninfa Linden for bilateral hip OA Has seen Dr. Durward Fortes who recommended he be evaluated for hip surgery  Work on losing weight to help reduce joint pain. May alternate with heat and ice application for pain relief. May also alternate with acetaminophen and Ibuprofen as prescribed pain relief. Other alternatives include massage, acupuncture and water aerobics.  You must stay active and avoid a sedentary lifestyle.  I offered tramadol or Tylenol #3 however patient declined both stating he normally takes hydrocodone which I do not prescribe.    Needs flu shot -     Flu Vaccine QUAD 6+ mos PF IM (Fluarix Quad PF)  Morbid (severe) obesity due to excess calories ([place]) Will likely need to lose weight prior to surgery. Options include phentermine if decides surgery is an option Discussed diet and exercise for person with BMI >40. Instructed: You must burn more calories than you  eat. Losing 5 percent of your body weight should be considered a success. In the longer term, losing more than 15 percent of your body weight and staying at this weight is an extremely good result. However, keep in mind that even losing 5 percent of your body weight leads to important health benefits, so try not to get discouraged if you're not able to lose more than this. .   Patient has been counseled on age-appropriate routine health concerns for screening and prevention. These are reviewed and up-to-date. Referrals have been placed accordingly. Immunizations are up-to-date or declined.    Subjective:   Chief Complaint  Patient presents with  . Hypertension  . Hyperlipidemia   HPI Aakash Kosior 53 y.o. male presents to office today for follow up.  has a past medical history of Anemia, GERD (gastroesophageal reflux disease), Gout, Hypertension, and Rheumatoid arthritis ([place]).   Prescription     Essential Hypertension Well controlled today. Taking amlodipine 10 mg daily and lisinopril 20 mg daily. Sometimes missing doses of amlodipine.  Denies chest pain, shortness of breath, palpitations, lightheadedness, dizziness, headaches or BLE edema. .  BP Readings from Last 3 Encounters:  08/31/19 116/75  08/04/19 133/89  07/01/19 (!) 148/89   Mixed Hyperlipidemia Well controlled. Taking atorvastatin 40 mg daily as prescribed. Denies any statin intolerance or myalgias. Non adherent with diet and exercise.  Lab Results  Component Value Date   LDLCALC 89 05/31/2019    GERD Well controlled with pantoprazole 40 mg daily.    Review of Systems  Constitutional: Negative for fever, malaise/fatigue and weight  loss.  HENT: Negative.  Negative for nosebleeds.   Eyes: Negative.  Negative for blurred vision, double vision and photophobia.  Respiratory: Negative.  Negative for cough and shortness of breath.   Cardiovascular: Negative.  Negative for chest pain, palpitations and leg swelling.   Gastrointestinal: Positive for heartburn. Negative for nausea and vomiting.  Musculoskeletal: Positive for back pain and joint pain. Negative for myalgias.  Neurological: Negative.  Negative for dizziness, focal weakness, seizures and headaches.  Psychiatric/Behavioral: Negative.  Negative for suicidal ideas.    Past Medical History:  Diagnosis Date  . Anemia   . GERD (gastroesophageal reflux disease)   . Gout   . Hypertension   . Rheumatoid arthritis [facility])     Past Surgical History:  Procedure Laterality Date  . HERNIA REPAIR      Family History  Problem Relation Age of Onset  . Rheum arthritis Mother   . Rheum arthritis Sister     Social History Reviewed with no changes to be made today.   Outpatient Medications Prior to Visit  Medication Sig Dispense Refill  . amLODipine (NORVASC) 10 MG tablet Take 1 tablet (10 mg total) by mouth daily. 90 tablet 1  . lisinopril (ZESTRIL) 20 MG tablet Take 1 tablet (20 mg total) by mouth daily. 30 tablet 11  . Multiple Vitamins-Minerals (MENS MULTIVITAMIN PLUS PO) Take 1 tablet by mouth daily.    . pantoprazole (PROTONIX) 40 MG tablet One pill(40mg ) 30 minutes before breakfast and one pill 30 minutes before dinner. 180 tablet 1  . atorvastatin (LIPITOR) 40 MG tablet Take 1 tablet (40 mg total) by mouth daily. (Patient not taking: Reported on 08/31/2019) 90 tablet 3  . gabapentin (NEURONTIN) 300 MG capsule Take 1 capsule (300 mg total) by mouth at bedtime as needed and may repeat dose one time if needed. 90 capsule 3  . meloxicam (MOBIC) 7.5 MG tablet Take 1 tablet (7.5 mg total) by mouth daily. 15 tablet 0  . predniSONE (DELTASONE) 50 MG tablet Take 1 tablet (50 mg total) by mouth daily with breakfast. 5 tablet 0  . tiZANidine (ZANAFLEX) 2 MG tablet Take 1 tablet (2 mg total) by mouth every 6 (six) hours as needed for muscle spasms. 20 tablet 0  . topiramate (TOPAMAX) 50 MG tablet Take 1 tablet by mouth 3 (three) times daily.     No  facility-administered medications prior to visit.     No Known Allergies     Objective:    BP 116/75   Pulse 85   Temp (!) 97.3 F (36.3 C) (Temporal)   Resp 17   Wt (!) 305 lb 9.6 oz (138.6 kg)   SpO2 94%   BMI 41.45 kg/m  Wt Readings from Last 3 Encounters:  08/31/19 (!) 305 lb 9.6 oz (138.6 kg)  06/29/19 300 lb (136.1 kg)  06/15/19 300 lb (136.1 kg)    Physical Exam Vitals signs and nursing note reviewed.  Constitutional:      Appearance: He is well-developed.  HENT:     Head: Normocephalic and atraumatic.  Neck:     Musculoskeletal: Normal range of motion.  Cardiovascular:     Rate and Rhythm: Normal rate and regular rhythm.     Heart sounds: Normal heart sounds. No murmur. No friction rub. No gallop.   Pulmonary:     Effort: Pulmonary effort is normal. No tachypnea or respiratory distress.     Breath sounds: Normal breath sounds. No decreased breath sounds, wheezing, rhonchi or rales.  Chest:     Chest wall: No tenderness.  Abdominal:     General: Bowel sounds are normal.     Palpations: Abdomen is soft.  Musculoskeletal: Normal range of motion.  Skin:    General: Skin is warm and dry.  Neurological:     Mental Status: He is alert and oriented to person, place, and time.     Coordination: Coordination normal.  Psychiatric:        Behavior: Behavior normal. Behavior is cooperative.        Thought Content: Thought content normal.        Judgment: Judgment normal.          Patient has been counseled extensively about nutrition and exercise as well as the importance of adherence with medications and regular follow-up. The patient was given clear instructions to go to ER or return to medical center if symptoms don't improve, worsen or new problems develop. The patient verbalized understanding.   Follow-up: Return in about 3 months (around 12/01/2019).   Gildardo Pounds, FNP-BC [facility], [place]    08/31/2019, 10:07 AM

## 2019-08-31 NOTE — Progress Notes (Signed)
Doesn't check BP at home. Denies chest pain, SHOB, lower extremity swelling, dizziness, palpitations. Has c/o occasional headaches.  Will get flu shot today.

## 2019-08-31 NOTE — Patient Instructions (Addendum)
Sent referral to [facility] for Pain Management Ph. # [phone] Address [address] [place], [place] [address].  °

## 2019-09-05 ENCOUNTER — Ambulatory Visit (INDEPENDENT_AMBULATORY_CARE_PROVIDER_SITE_OTHER): Payer: BLUE CROSS/BLUE SHIELD | Admitting: Orthopaedic Surgery

## 2019-09-05 ENCOUNTER — Other Ambulatory Visit: Payer: Self-pay

## 2019-09-05 VITALS — Ht 72.0 in | Wt 305.0 lb

## 2019-09-05 DIAGNOSIS — M25552 Pain in left hip: Secondary | ICD-10-CM | POA: Diagnosis not present
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
## 2019-09-14 ENCOUNTER — Telehealth: Payer: Self-pay | Admitting: Family Medicine

## 2019-09-14 NOTE — Telephone Encounter (Signed)
Patients wife calling in regards to patients airflow on his machine. He had been instructed to get in touch with you to increase that flow by [employer].

## 2019-09-14 NOTE — Telephone Encounter (Signed)
Spoke with [name] regarding his CPAP. All questions answered

## 2019-09-20 ENCOUNTER — Other Ambulatory Visit: Payer: BLUE CROSS/BLUE SHIELD

## 2019-11-29 ENCOUNTER — Other Ambulatory Visit: Payer: Self-pay

## 2019-11-29 DIAGNOSIS — I10 Essential (primary) hypertension: Secondary | ICD-10-CM

## 2019-11-29 DIAGNOSIS — Z114 Encounter for screening for human immunodeficiency virus [HIV]: Secondary | ICD-10-CM

## 2019-11-29 DIAGNOSIS — R7303 Prediabetes: Secondary | ICD-10-CM

## 2019-11-29 NOTE — Progress Notes (Signed)
Patient here for non-fasting labs. Unable to obtain labs. Will reschedule appt.

## 2019-11-30 ENCOUNTER — Ambulatory Visit (INDEPENDENT_AMBULATORY_CARE_PROVIDER_SITE_OTHER): Payer: Self-pay | Admitting: Nurse Practitioner

## 2019-11-30 DIAGNOSIS — I10 Essential (primary) hypertension: Secondary | ICD-10-CM

## 2019-11-30 DIAGNOSIS — K219 Gastro-esophageal reflux disease without esophagitis: Secondary | ICD-10-CM

## 2019-11-30 DIAGNOSIS — E785 Hyperlipidemia, unspecified: Secondary | ICD-10-CM

## 2019-11-30 MED ORDER — LISINOPRIL 20 MG PO TABS: 20.0000 mg | ORAL_TABLET | Freq: Every day | ORAL | 0 refills | Status: DC

## 2019-11-30 MED ORDER — AMLODIPINE BESYLATE 10 MG PO TABS: 10.0000 mg | ORAL_TABLET | Freq: Every day | ORAL | 0 refills | Status: DC

## 2019-11-30 MED ORDER — ATORVASTATIN CALCIUM 40 MG PO TABS: 40.0000 mg | ORAL_TABLET | Freq: Every day | ORAL | 0 refills | Status: DC

## 2019-11-30 MED ORDER — PANTOPRAZOLE SODIUM 40 MG PO TBEC: DELAYED_RELEASE_TABLET | ORAL | 0 refills | Status: DC

## 2019-11-30 NOTE — Progress Notes (Signed)
Virtual Visit via Telephone Note Due to national recommendations of social distancing due to [place] 19, telehealth visit is felt to be most appropriate for this patient at this time.  I discussed the limitations, risks, security and privacy concerns of performing an evaluation and management service by telephone and the availability of in person appointments. I also discussed with the patient that there may be a patient responsible charge related to this service. The patient expressed understanding and agreed to proceed.    I connected with [name] on 11/30/19  at   8:30 AM EST  EDT by telephone and verified that I am speaking with the correct person using two identifiers.   Consent I discussed the limitations, risks, security and privacy concerns of performing an evaluation and management service by telephone and the availability of in person appointments. I also discussed with the patient that there may be a patient responsible charge related to this service. The patient expressed understanding and agreed to proceed.   Location of Patient: Private  Residence   Location of Provider: [facility] [employer] [facility]    Persons participating in Telemedicine visit: Geryl Rankins FNP-BC [place]    History of Present Illness: Telemedicine visit for: HTN   Essential Hypertension Taking amlodipine 10 mg, lisinopril 20 mg as prescribed. Denies chest pain, shortness of breath, palpitations, lightheadedness, dizziness, headaches or BLE edema. He is using his CPAP every night as prescribed. BP 164/106 today.  BP Readings from Last 3 Encounters:  08/31/19 116/75  08/04/19 133/89  07/01/19 (!) 148/89   GERD Patient takes protonix 40 mg daily as prescribed.   He denies bilious reflux, chest pain, heartburn, hematemesis, melena, midespigastric pain and regurgitation of undigested food.  He has not lost weight. He denies melena, hematochezia, hematemesis, and  coffee ground emesis. Medical therapy in the past has included H2 antagonists and proton pump inhibitors.  Past Medical History:  Diagnosis Date  . Anemia   . GERD (gastroesophageal reflux disease)   . Gout   . Hypertension   . Rheumatoid arthritis [facility])     Past Surgical History:  Procedure Laterality Date  . HERNIA REPAIR      Family History  Problem Relation Age of Onset  . Rheum arthritis Mother   . Rheum arthritis Sister     Social History   Socioeconomic History  . Marital status: Married    Spouse name: Not on file  . Number of children: Not on file  . Years of education: Not on file  . Highest education level: Not on file  Occupational History  . Not on file  Tobacco Use  . Smoking status: Former Smoker    Packs/day: 0.75    Types: Cigarettes    Quit date: 2015    Years since quitting: 6.0  . Smokeless tobacco: Never Used  Substance and Sexual Activity  . Alcohol use: Not Currently  . Drug use: No  . Sexual activity: Not on file  Other Topics Concern  . Not on file  Social History Narrative  . Not on file   Social Determinants of Health   Financial Resource Strain:   . Difficulty of Paying Living Expenses: Not on file  Food Insecurity:   . Worried About Charity fundraiser in the Last Year: Not on file  . Ran Out of Food in the Last Year: Not on file  Transportation Needs:   . Lack of Transportation (Medical): Not on file  .  Lack of Transportation (Non-Medical): Not on file  Physical Activity:   . Days of Exercise per Week: Not on file  . Minutes of Exercise per Session: Not on file  Stress:   . Feeling of Stress : Not on file  Social Connections:   . Frequency of Communication with Friends and Family: Not on file  . Frequency of Social Gatherings with Friends and Family: Not on file  . Attends Religious Services: Not on file  . Active Member of Clubs or Organizations: Not on file  . Attends Archivist Meetings: Not on file  .  Marital Status: Not on file     Observations/Objective: Awake, alert and oriented x 3   Review of Systems  Constitutional: Negative for fever, malaise/fatigue and weight loss.  HENT: Negative.  Negative for nosebleeds.   Eyes: Negative.  Negative for blurred vision, double vision and photophobia.  Respiratory: Negative.  Negative for cough and shortness of breath.   Cardiovascular: Negative.  Negative for chest pain, palpitations and leg swelling.  Gastrointestinal: Positive for heartburn. Negative for nausea and vomiting.  Musculoskeletal: Negative.  Negative for myalgias.  Neurological: Negative.  Negative for dizziness, focal weakness, seizures and headaches.  Psychiatric/Behavioral: Negative.  Negative for suicidal ideas.    Assessment and Plan: Melecio was seen today for hypertension and hyperlipidemia.  Diagnoses and all orders for this visit:  Essential hypertension -     amLODipine (NORVASC) 10 MG tablet; Take 1 tablet (10 mg total) by mouth daily. -     lisinopril (ZESTRIL) 20 MG tablet; Take 1 tablet (20 mg total) by mouth daily. Continue all antihypertensives as prescribed.  Remember to bring in your blood pressure log with you for your follow up appointment.  DASH/Mediterranean Diets are healthier choices for HTN.   Pressure is elevated today however patient states he has not taken his antihypertensives this morning.  He was instructed to monitor his blood pressure over the next 2 to 3 weeks and will follow-up for an office visit for blood pressure check.  If elevated and BMP within normal limits may increase lisinopril from 20 to 40 mg.  Dyslipidemia -     atorvastatin (LIPITOR) 40 MG tablet; Take 1 tablet (40 mg total) by mouth daily. INSTRUCTIONS: Work on a low fat, heart healthy diet and participate in regular aerobic exercise program by working out at least 150 minutes per week; 5 days a week-30 minutes per day. Avoid red meat/beef/steak,  fried foods. junk foods,  sodas, sugary drinks, unhealthy snacking, alcohol and smoking.  Drink at least 80 oz of water per day and monitor your carbohydrate intake daily.   Gastroesophageal reflux disease without esophagitis -     pantoprazole (PROTONIX) 40 MG tablet; One pill(40mg ) 30 minutes before breakfast and one pill 30 minutes before dinner. INSTRUCTIONS: Avoid GERD Triggers: acidic, spicy or fried foods, caffeine, coffee, sodas,  alcohol and chocolate.    Morbid (severe) obesity due to excess calories ([place]) Requested weight loss medication today however I have recommended that he start a routine exercise program and monitor his diet.  If successful with changing his habits may revisit the idea of an adjunct weight loss regimen.  Follow Up Instructions Return in about 3 weeks (around 12/21/2019) for BP recheck.     I discussed the assessment and treatment plan with the patient. The patient was provided an opportunity to ask questions and all were answered. The patient agreed with the plan and demonstrated an understanding of the  instructions.   The patient was advised to call back or seek an in-person evaluation if the symptoms worsen or if the condition fails to improve as anticipated.  I provided 13 minutes of non-face-to-face time during this encounter including median intraservice time, reviewing previous notes, labs, imaging, medications and explaining diagnosis and management.  Gildardo Pounds, FNP-BC

## 2019-12-01 ENCOUNTER — Other Ambulatory Visit: Payer: Medicaid Other

## 2019-12-23 ENCOUNTER — Other Ambulatory Visit: Payer: Self-pay | Admitting: Adult Health

## 2019-12-23 DIAGNOSIS — U071 COVID-19: Secondary | ICD-10-CM

## 2019-12-23 NOTE — Progress Notes (Signed)
  I connected by phone with Darrell Conway on 12/23/2019 at 6:50 PM to discuss the potential use of an new treatment for mild to moderate COVID-19 viral infection in non-hospitalized patients.  This patient is a 54 y.o. male that meets the FDA criteria for Emergency Use Authorization of bamlanivimab or casirivimab\imdevimab.  Has a (+) direct SARS-CoV-2 viral test result  Has mild or moderate COVID-19   Is ? 54 years of age and weighs ? 40 kg  Is NOT hospitalized due to COVID-19  Is NOT requiring oxygen therapy or requiring an increase in baseline oxygen flow rate due to COVID-19  Is within 10 days of symptom onset  Has at least one of the high risk factor(s) for progression to severe COVID-19 and/or hospitalization as defined in EUA.  Specific high risk criteria : BMI >/= 35; symptom onset 1/22   I have spoken and communicated the following to the patient or parent/caregiver:  1. FDA has authorized the emergency use of bamlanivimab and casirivimab\imdevimab for the treatment of mild to moderate COVID-19 in adults and pediatric patients with positive results of direct SARS-CoV-2 viral testing who are 68 years of age and older weighing at least 40 kg, and who are at high risk for progressing to severe COVID-19 and/or hospitalization.  2. The significant known and potential risks and benefits of bamlanivimab and casirivimab\imdevimab, and the extent to which such potential risks and benefits are unknown.  3. Information on available alternative treatments and the risks and benefits of those alternatives, including clinical trials.  4. Patients treated with bamlanivimab and casirivimab\imdevimab should continue to self-isolate and use infection control measures (e.g., wear mask, isolate, social distance, avoid sharing personal items, clean and disinfect "high touch" surfaces, and frequent handwashing) according to CDC guidelines.   5. The patient or parent/caregiver has the option to  accept or refuse bamlanivimab or casirivimab\imdevimab .  After reviewing this information with the patient, The patient agreed to proceed with receiving the bamlanimivab infusion and will be provided a copy of the Fact sheet prior to receiving the infusion.Scot Dock 12/23/2019 6:50 PM

## 2019-12-26 ENCOUNTER — Ambulatory Visit (HOSPITAL_COMMUNITY): Payer: Medicaid Other

## 2020-01-05 ENCOUNTER — Telehealth: Payer: Self-pay | Admitting: Internal Medicine

## 2020-01-05 ENCOUNTER — Ambulatory Visit (INDEPENDENT_AMBULATORY_CARE_PROVIDER_SITE_OTHER): Payer: Medicaid Other | Admitting: Internal Medicine

## 2020-01-05 ENCOUNTER — Other Ambulatory Visit: Payer: Self-pay

## 2020-01-05 ENCOUNTER — Encounter: Payer: Self-pay | Admitting: Internal Medicine

## 2020-01-05 DIAGNOSIS — G894 Chronic pain syndrome: Secondary | ICD-10-CM

## 2020-01-05 DIAGNOSIS — N529 Male erectile dysfunction, unspecified: Secondary | ICD-10-CM

## 2020-01-05 DIAGNOSIS — U071 COVID-19: Secondary | ICD-10-CM

## 2020-01-05 MED ORDER — SILDENAFIL CITRATE 50 MG PO TABS: 50.0000 mg | ORAL_TABLET | Freq: Every day | ORAL | 0 refills | Status: DC | PRN

## 2020-01-05 NOTE — Telephone Encounter (Signed)
Pt called asking fluticasone (FLONASE) 50 MCG/ACT nasal spray [id number]  Its the same medication you sent his wife.

## 2020-01-05 NOTE — Progress Notes (Signed)
Virtual Visit via Telephone Note  I connected with Darrell Conway, on 01/05/2020 at 9:55 AM by telephone due to the COVID-19 pandemic and verified that I am speaking with the correct person using two identifiers.   Consent: I discussed the limitations, risks, security and privacy concerns of performing an evaluation and management service by telephone and the availability of in person appointments. I also discussed with the patient that there may be a patient responsible charge related to this service. The patient expressed understanding and agreed to proceed.   Location of Patient: Home   Location of Provider: Clinic    Persons participating in Telemedicine visit: Moustafa Gladd [facility] Dr. Juleen China      History of Present Illness: Patient has a visit to follow up for [place]. Reports he is so glad that it is over with and that he didn't have to be hospitalized. Patient was tested at [facility] at the end of January and was positive. Patient reports that he feels 100%. At first he didn't have any taste or energy. Felt muscle aches and chills all over. Reports he had diarrhea and dehydration and lost over 20 lbs. He is now eating normally. Patient reports he has the masks, shields, Clorox wipes, Lysol, etc.    Past Medical History:  Diagnosis Date  . Anemia   . GERD (gastroesophageal reflux disease)   . Gout   . Hypertension   . Rheumatoid arthritis ([place])    No Known Allergies  Current Outpatient Medications on File Prior to Visit  Medication Sig Dispense Refill  . amLODipine (NORVASC) 10 MG tablet Take 1 tablet (10 mg total) by mouth daily. 90 tablet 0  . atorvastatin (LIPITOR) 40 MG tablet Take 1 tablet (40 mg total) by mouth daily. 90 tablet 0  . lisinopril (ZESTRIL) 20 MG tablet Take 1 tablet (20 mg total) by mouth daily. 90 tablet 0  . Multiple Vitamins-Minerals (MENS MULTIVITAMIN PLUS PO) Take 1 tablet by mouth daily.    . pantoprazole (PROTONIX) 40 MG tablet One  pill(40mg ) 30 minutes before breakfast and one pill 30 minutes before dinner. 180 tablet 0   No current facility-administered medications on file prior to visit.    Observations/Objective: NAD. Speaking clearly.  Work of breathing normal.  Alert and oriented. Mood appropriate.   Assessment and Plan: 1. COVID-19 Has recovered well from the virus. No concerns.   2. Chronic pain syndrome Chronic pain related to hip pain. Would like referral for pain clinic in [place]. Was unable to go there in past due to insurance. Takes Oxycodone.  - Ambulatory referral to Pain Clinic  3. Erectile dysfunction, unspecified erectile dysfunction type Patient having problems achieving an erection even though he is interested in intercourse. Would like to try Viagra. Counseled that can not be used with nitrates; patient does not have an Rx for any.  - sildenafil (VIAGRA) 50 MG tablet; Take 1 tablet (50 mg total) by mouth daily as needed for erectile dysfunction.  Dispense: 10 tablet; Refill: 0    Follow Up Instructions: Follow up for annual exam    I discussed the assessment and treatment plan with the patient. The patient was provided an opportunity to ask questions and all were answered. The patient agreed with the plan and demonstrated an understanding of the instructions.   The patient was advised to call back or seek an in-person evaluation if the symptoms worsen or if the condition fails to improve as anticipated.     I provided 16  minutes total of non-face-to-face time during this encounter including median intraservice time, reviewing previous notes, investigations, ordering medications, medical decision making, coordinating care and patient verbalized understanding at the end of the visit.    Phill Myron, D.O. Primary Care at [facility]  01/05/2020, 9:55 AM

## 2020-01-06 ENCOUNTER — Other Ambulatory Visit: Payer: Self-pay | Admitting: Internal Medicine

## 2020-01-06 MED ORDER — FLUTICASONE PROPIONATE 50 MCG/ACT NA SUSP: 2.0000 | Freq: Every day | NASAL | 6 refills | Status: DC

## 2020-01-06 NOTE — Progress Notes (Signed)
Rx for Flonase sent per patient request.   Phill Myron, D.O. Primary Care at [facility]  01/06/2020, 8:53 AM

## 2020-01-28 ENCOUNTER — Ambulatory Visit
Admission: RE | Admit: 2020-01-28 | Discharge: 2020-01-28 | Disposition: A | Discharge status: Home / Self Care | Payer: Medicaid Other | Source: Ambulatory Visit | Attending: Orthopaedic Surgery | Admitting: Orthopaedic Surgery

## 2020-01-28 ENCOUNTER — Other Ambulatory Visit: Payer: Self-pay

## 2020-01-28 DIAGNOSIS — M25552 Pain in left hip: Secondary | ICD-10-CM

## 2020-01-30 ENCOUNTER — Ambulatory Visit (INDEPENDENT_AMBULATORY_CARE_PROVIDER_SITE_OTHER): Payer: 59 | Admitting: Internal Medicine

## 2020-01-30 ENCOUNTER — Encounter: Payer: Self-pay | Admitting: Internal Medicine

## 2020-01-30 DIAGNOSIS — U071 COVID-19: Secondary | ICD-10-CM | POA: Diagnosis not present

## 2020-01-30 NOTE — Progress Notes (Signed)
Virtual Visit via Telephone Note  I connected with Darrell Conway, on 01/30/2020 at 4:13 PM by telephone due to the COVID-19 pandemic and verified that I am speaking with the correct person using two identifiers.   Consent: I discussed the limitations, risks, security and privacy concerns of performing an evaluation and management service by telephone and the availability of in person appointments. I also discussed with the patient that there may be a patient responsible charge related to this service. The patient expressed understanding and agreed to proceed.   Location of Patient: Home   Location of Provider: Clinic    Persons participating in Telemedicine visit: Rondy Oakman [facility] Dr. Juleen China   History of Present Illness: Patient has a visit to f/u for COVID symptoms. Reports that he has been feeling feverish and having "hot flashes". Has not checked his temperature. Will start sweating. Also reports feeling like he is in a fog and is having trouble concentrating. Reports he just doesn't feel the same since he had COVID.    Past Medical History:  Diagnosis Date  . Anemia   . GERD (gastroesophageal reflux disease)   . Gout   . Hypertension   . Rheumatoid arthritis ([place])    No Known Allergies  Current Outpatient Medications on File Prior to Visit  Medication Sig Dispense Refill  . amLODipine (NORVASC) 10 MG tablet Take 1 tablet (10 mg total) by mouth daily. 90 tablet 0  . atorvastatin (LIPITOR) 40 MG tablet Take 1 tablet (40 mg total) by mouth daily. 90 tablet 0  . fluticasone (FLONASE) 50 MCG/ACT nasal spray Place 2 sprays into both nostrils daily. 16 g 6  . lisinopril (ZESTRIL) 20 MG tablet Take 1 tablet (20 mg total) by mouth daily. 90 tablet 0  . meloxicam (MOBIC) 15 MG tablet Take 15 mg by mouth daily.    . Multiple Vitamins-Minerals (MENS MULTIVITAMIN PLUS PO) Take 1 tablet by mouth daily.    . Oxycodone HCl 10 MG TABS Take 10 mg by mouth 3 (three) times  daily as needed.    . pantoprazole (PROTONIX) 40 MG tablet One pill(40mg ) 30 minutes before breakfast and one pill 30 minutes before dinner. 180 tablet 0  . sildenafil (VIAGRA) 50 MG tablet Take 1 tablet (50 mg total) by mouth daily as needed for erectile dysfunction. 10 tablet 0   No current facility-administered medications on file prior to visit.    Observations/Objective: NAD. Speaking clearly.  Work of breathing normal.  Alert and oriented. Mood appropriate.   Assessment and Plan: 1. COVID-19 Virus  Patient tested positive at end of January. Has symptoms that could be concerning for lingering COVID like symptoms. He would like to be evaluated at the [place] clinic so that he can have an in person exam. Stable and does not warrant ER care at this time. Discussed COVID symptoms can linger and be broad in presentation. Continue to adhere to 3 W's, get vaccinated when able.   Follow Up Instructions: PRN with PCP, COVID clinic f/u scheduled    I discussed the assessment and treatment plan with the patient. The patient was provided an opportunity to ask questions and all were answered. The patient agreed with the plan and demonstrated an understanding of the instructions.   The patient was advised to call back or seek an in-person evaluation if the symptoms worsen or if the condition fails to improve as anticipated.     I provided 12 minutes total of non-face-to-face time during this encounter  including median intraservice time, reviewing previous notes, investigations, ordering medications, medical decision making, coordinating care and patient verbalized understanding at the end of the visit.    Phill Myron, D.O. Primary Care at [facility]  01/30/2020, 4:13 PM

## 2020-01-31 ENCOUNTER — Ambulatory Visit (INDEPENDENT_AMBULATORY_CARE_PROVIDER_SITE_OTHER): Payer: 59 | Admitting: Orthopaedic Surgery

## 2020-01-31 ENCOUNTER — Encounter: Payer: Self-pay | Admitting: Orthopaedic Surgery

## 2020-01-31 ENCOUNTER — Other Ambulatory Visit: Payer: Self-pay

## 2020-01-31 DIAGNOSIS — M1612 Unilateral primary osteoarthritis, left hip: Secondary | ICD-10-CM | POA: Diagnosis not present

## 2020-01-31 DIAGNOSIS — M25552 Pain in left hip: Secondary | ICD-10-CM | POA: Diagnosis not present

## 2020-01-31 NOTE — Progress Notes (Signed)
The patient is a 54 year old gentleman with severe left hip pain who is following up after we obtain an MRI of his left hip.  He still has pain in the groin and it is detrimentally affecting his mobility, his quality of life, and his activities of daily living.  He works as a Chief Strategy Officer.  His plain films were suspicious for osteoarthritis of his hip.  He is someone with a BMI of just over 40.  He is worked on weight loss and activity modification.  This is been getting worse for over a year now.  He was referred to me by one of our other orthopedic colleagues.  After I examined him and found severe pain with internal and external rotation of his hip and assess his x-rays I felt it was worthwhile obtaining an MRI to assess the degree of his arthritis.  He has had no other acute changes in medical status.  He still tries to work through his pain daily and continues to work in general.  On examination I had him lay in a supine position so I could assess his left hip to see if we are comfortable proceeding with an anterior hip replacement.  I feel comfortable immobilizing his soft tissues.  He has severe pain with internal and external rotation still of his left hip.  MRIs reviewed with him and it does show areas of full-thickness cartilage loss of the weightbearing surface of the femoral head and acetabulum on the left side.  There is cystic changes around the acetabulum as well.  There is also degenerative labral tear.  At this point we are recommending hip replacement surgery.  I showed him a hip model and spent a considerable time explaining in detail what the surgery involves.  I talked about the intraoperative and postoperative course as well as the risk of the risk of surgery.  Given his obesity he knows that there is a high risk of acute blood loss anemia, normal respiratory, infection, soft tissue issues, dislocation, fracture and implant failure.  He understands her goals are decreased pain, improve  mobility, and improve quality of life.  He is interested in having this scheduled.  He will continue to work on weight loss while we get this scheduled.  All questions and concerns were answered and addressed.  I will have our surgery scheduler give him a call.  We would then see him back in 2 weeks postoperative.

## 2020-02-01 ENCOUNTER — Ambulatory Visit: Payer: 59

## 2020-02-06 ENCOUNTER — Ambulatory Visit: Payer: 59

## 2020-02-06 NOTE — Progress Notes (Deleted)
 @  Patient ID: Darrell Conway, male    DOB: [birth year], 54 y.o.   MRN: [id number]  No chief complaint on file.   Referring provider: Caryl Never*  54 year old male with history of obesity, rheumatoid arthritis, GERD, hypertension, and anemia. Recent history of Covid in January 2021.   HPI          No Known Allergies  Immunization History  Administered Date(s) Administered  . Influenza,inj,Quad PF,6+ Mos 10/20/2018, 08/31/2019    Past Medical History:  Diagnosis Date  . Anemia   . GERD (gastroesophageal reflux disease)   . Gout   . Hypertension   . Rheumatoid arthritis ([place])     Tobacco History: Social History   Tobacco Use  Smoking Status Former Smoker  . Packs/day: 0.75  . Types: Cigarettes  . Quit date: 2015  . Years since quitting: 6.2  Smokeless Tobacco Never Used   Counseling given: Not Answered   Outpatient Encounter Medications as of 02/06/2020  Medication Sig  . amLODipine (NORVASC) 10 MG tablet Take 1 tablet (10 mg total) by mouth daily.  Marland Kitchen atorvastatin (LIPITOR) 40 MG tablet Take 1 tablet (40 mg total) by mouth daily.  . fluticasone (FLONASE) 50 MCG/ACT nasal spray Place 2 sprays into both nostrils daily.  Marland Kitchen lisinopril (ZESTRIL) 20 MG tablet Take 1 tablet (20 mg total) by mouth daily.  . meloxicam (MOBIC) 15 MG tablet Take 15 mg by mouth daily.  . Multiple Vitamins-Minerals (MENS MULTIVITAMIN PLUS PO) Take 1 tablet by mouth daily.  . Oxycodone HCl 10 MG TABS Take 10 mg by mouth 3 (three) times daily as needed.  . pantoprazole (PROTONIX) 40 MG tablet One pill(40mg ) 30 minutes before breakfast and one pill 30 minutes before dinner.  . sildenafil (VIAGRA) 50 MG tablet Take 1 tablet (50 mg total) by mouth daily as needed for erectile dysfunction.   No facility-administered encounter medications on file as of 02/06/2020.     Review of Systems  Review of Systems     Physical Exam  There were no vitals taken for this visit.  Wt  Readings from Last 5 Encounters:  09/05/19 (!) 305 lb (138.3 kg)  08/31/19 (!) 305 lb 9.6 oz (138.6 kg)  06/29/19 300 lb (136.1 kg)  06/15/19 300 lb (136.1 kg)  05/31/19 (!) 301 lb 9.6 oz (136.8 kg)     Physical Exam   Lab Results:  CBC    Component Value Date/Time   WBC 5.9 04/27/2019 0704   RBC 5.18 04/27/2019 0704   HGB 15.0 04/27/2019 0704   HGB 14.9 03/28/2019 0949   HCT 44.6 04/27/2019 0704   HCT 43.2 03/28/2019 0949   PLT 277 04/27/2019 0704   PLT 262 03/28/2019 0949   MCV 86.1 04/27/2019 0704   MCV 85 03/28/2019 0949   MCH 29.0 04/27/2019 0704   MCHC 33.6 04/27/2019 0704   RDW 13.6 04/27/2019 0704   RDW 14.6 03/28/2019 0949   LYMPHSABS 2,130 04/27/2019 0704   LYMPHSABS 2.4 03/28/2019 0949   MONOABS 0.6 01/06/2019 1454   EOSABS 189 04/27/2019 0704   EOSABS 0.1 03/28/2019 0949   BASOSABS 30 04/27/2019 0704   BASOSABS 0.0 03/28/2019 0949     Assessment & Plan:   No problem-specific Assessment & Plan notes found for this encounter.     Fenton Foy, NP 02/06/2020

## 2020-02-07 ENCOUNTER — Telehealth: Payer: Self-pay | Admitting: Radiology

## 2020-02-07 ENCOUNTER — Telehealth: Payer: Self-pay | Admitting: Orthopaedic Surgery

## 2020-02-07 MED ORDER — ACETAMINOPHEN-CODEINE #3 300-30 MG PO TABS: 1.0000 | ORAL_TABLET | Freq: Three times a day (TID) | ORAL | 0 refills | Status: DC | PRN

## 2020-02-07 NOTE — Telephone Encounter (Signed)
Patient's wife called.   They are requesting he be referred to a pain management doctor or prescribed something for his pain.   Call back number: [phone]

## 2020-02-07 NOTE — Telephone Encounter (Signed)
He does not need any pain medication from [place] then.  They do not need to refill the Tylenol 3

## 2020-02-07 NOTE — Telephone Encounter (Signed)
Already spoke with patients wife about pain medicine and pain management, earlier this morning. Have you spoke with patient or wife about hip surgery?  Thank you.

## 2020-02-07 NOTE — Telephone Encounter (Signed)
Please advise 

## 2020-02-07 NOTE — Telephone Encounter (Signed)
Spoke with pharmacist advised do not fill Tylenol #3. Called patients wife explained why Tylenol #3 will not be filled and that he has already picked up the oxycodone on 01/25/20. Wife stated patient wanted to to be prescribed for next month he was wanting to switch pain management to [place]. Explained that they can contact that prescribing provider of the oxycodone for Rx when its closer to refill, right now is too soon for new Rx and they can seen referral to new pain management in [place]. Explained from Dr. Trevor Mace standpoint he needs hip surgery vs. pain management. Patients wife understands.

## 2020-02-07 NOTE — Telephone Encounter (Signed)
[facility] called and left message. Received Tylenol #3 Rx however patient is already being prescribed oxycodone 10mg  90quantiy 30days for a chronic condition and had been on this a while, patient last filled this Rx on 01/25/2020.  Pharmacy called to make sure Dr. Ninfa Linden is aware and requests need for the Tylenol #3 and if still need to fill.  CB# [phone]

## 2020-02-07 NOTE — Telephone Encounter (Signed)
I called patient and scheduled surgery. 

## 2020-02-07 NOTE — Telephone Encounter (Signed)
I did send in some Tylenol 3 to try.  He does not need pain management, he needs a hip replacement.  Find out from Samella Parr if this is getting scheduled please thank you

## 2020-02-08 ENCOUNTER — Ambulatory Visit: Payer: 59

## 2020-02-13 ENCOUNTER — Ambulatory Visit (INDEPENDENT_AMBULATORY_CARE_PROVIDER_SITE_OTHER): Payer: 59 | Admitting: Nurse Practitioner

## 2020-02-13 ENCOUNTER — Other Ambulatory Visit: Payer: Self-pay

## 2020-02-13 VITALS — BP 142/94 | HR 96 | Temp 95.5°F | Ht 72.0 in | Wt 294.0 lb

## 2020-02-13 DIAGNOSIS — Z8616 Personal history of COVID-19: Secondary | ICD-10-CM | POA: Diagnosis not present

## 2020-02-13 DIAGNOSIS — R413 Other amnesia: Secondary | ICD-10-CM | POA: Diagnosis not present

## 2020-02-13 DIAGNOSIS — R41840 Attention and concentration deficit: Secondary | ICD-10-CM

## 2020-02-13 DIAGNOSIS — M791 Myalgia, unspecified site: Secondary | ICD-10-CM | POA: Diagnosis not present

## 2020-02-13 DIAGNOSIS — I499 Cardiac arrhythmia, unspecified: Secondary | ICD-10-CM | POA: Diagnosis not present

## 2020-02-13 NOTE — Progress Notes (Signed)
@Patient  ID: Darrell Conway, male    DOB: [birth year], 54 y.o.   MRN: [id number]  Chief Complaint  Patient presents with  . Post COVID    body aches, hot flashes, short term memory    Referring provider: Caryl Never*  54 year old male with history of anemia, GERD, gout, hypertension, rheumatoid arthritis, and diagnosis of Covid in January 2021.  HPI   Patient presents today for post Covid care visit.  Patient was diagnosed with Covid in January 2021.  He said that he did have moderate to severe symptoms at that time, but was not hospitalized. Patient states that he lost 20 pounds during that time. Patient complains today of ongoing hot flashes and periods of sweating and muscle pain. He states that he has not been running a fever. He also complains of memory loss and poor concentration. The example he gives today is that he has gotten in his car and started driving down the road and forgets where he is trying to go. Patient admits that he has been diagnosed with sleep apnea in the past, but is not compliant with CPAP. Denies f/c/s, n/v/d, hemoptysis, PND, chest pain or edema.      No Known Allergies  Immunization History  Administered Date(s) Administered  . Influenza,inj,Quad PF,6+ Mos 10/20/2018, 08/31/2019    Past Medical History:  Diagnosis Date  . Anemia   . GERD (gastroesophageal reflux disease)   . Gout   . Hypertension   . Rheumatoid arthritis ([place])     Tobacco History: Social History   Tobacco Use  Smoking Status Former Smoker  . Packs/day: 0.75  . Types: Cigarettes  . Quit date: 2015  . Years since quitting: 6.2  Smokeless Tobacco Never Used   Counseling given: Not Answered   Outpatient Encounter Medications as of 02/13/2020  Medication Sig  . acetaminophen-codeine (TYLENOL #3) 300-30 MG tablet Take 1-2 tablets by mouth every 8 (eight) hours as needed for moderate pain.  Marland Kitchen amLODipine (NORVASC) 10 MG tablet Take 1 tablet (10 mg total) by mouth  daily.  Marland Kitchen atorvastatin (LIPITOR) 40 MG tablet Take 1 tablet (40 mg total) by mouth daily.  . fluticasone (FLONASE) 50 MCG/ACT nasal spray Place 2 sprays into both nostrils daily.  Marland Kitchen lisinopril (ZESTRIL) 20 MG tablet Take 1 tablet (20 mg total) by mouth daily.  . meloxicam (MOBIC) 15 MG tablet Take 15 mg by mouth daily.  . Multiple Vitamins-Minerals (MENS MULTIVITAMIN PLUS PO) Take 1 tablet by mouth daily.  . Oxycodone HCl 10 MG TABS Take 10 mg by mouth 3 (three) times daily as needed.  . pantoprazole (PROTONIX) 40 MG tablet One pill(40mg ) 30 minutes before breakfast and one pill 30 minutes before dinner.  . sildenafil (VIAGRA) 50 MG tablet Take 1 tablet (50 mg total) by mouth daily as needed for erectile dysfunction.   No facility-administered encounter medications on file as of 02/13/2020.     Review of Systems  Review of Systems  Constitutional: Positive for activity change (decreased). Negative for chills and fever.  Respiratory: Negative for cough, shortness of breath and wheezing.   Cardiovascular: Negative for chest pain, palpitations and leg swelling.  Psychiatric/Behavioral: Positive for decreased concentration.       Memory loss  All other systems reviewed and are negative.      Physical Exam  BP (!) 142/94 (BP Location: Left Arm, Patient Position: Sitting)   Pulse 96   Temp (!) 95.5 F (35.3 C)   Ht 6' (  1.829 m)   Wt 294 lb (133.4 kg)   SpO2 (!) 80%   BMI 39.87 kg/m   Wt Readings from Last 5 Encounters:  02/13/20 294 lb (133.4 kg)  09/05/19 (!) 305 lb (138.3 kg)  08/31/19 (!) 305 lb 9.6 oz (138.6 kg)  06/29/19 300 lb (136.1 kg)  06/15/19 300 lb (136.1 kg)     Physical Exam Vitals and nursing note reviewed.  Constitutional:      General: He is not in acute distress.    Appearance: He is well-developed. He is not ill-appearing or toxic-appearing.  Cardiovascular:     Rate and Rhythm: Normal rate. Rhythm irregular.  Pulmonary:     Effort: Pulmonary effort  is normal.     Breath sounds: Normal breath sounds.  Musculoskeletal:     Right lower leg: No edema.     Left lower leg: No edema.  Skin:    General: Skin is warm and dry.  Neurological:     Mental Status: He is alert and oriented to person, place, and time.  Psychiatric:        Mood and Affect: Mood normal.     Comments: Slow to answer questions during exam.       Imaging: MR Hip Left w/o contrast  Result Date: 01/30/2020 CLINICAL DATA:  Chronic hip pain, left worse than right EXAM: MR OF THE LEFT HIP WITHOUT CONTRAST TECHNIQUE: Multiplanar, multisequence MR imaging was performed. No intravenous contrast was administered. COMPARISON:  X-ray 03/24/2019 FINDINGS: Bones: No acute fracture. No dislocation. No femoral head AVN. Subchondral cystic changes at the superomedial aspect of the left femoral head and opposing acetabulum. Small probable synovial herniation pit at the lateral aspect of the right femoral head-neck junction. No suspicious bone lesions. Articular cartilage and labrum Articular cartilage: Moderate chondral thinning and surface irregularity with areas of full-thickness cartilage loss particularly involving the medial aspects of the left femoral head and opposing acetabulum. Mild chondral surface irregularity of the contralateral right hip. Labrum: Diffuse labral degeneration with full-thickness tearing of the superior labrum (series 14, images 12-13; series 9, images 16-19). Joint or bursal effusion Joint effusion:  None. Bursae: No abnormal bursal fluid collections Muscles and tendons Muscles and tendons: Tendinous structures about the left hip are intact. Partial-thickness insertional tears of the right gluteus minimus tendon noted. Muscle bulk and signal intensity is unremarkable. Other findings Miscellaneous:   None. IMPRESSION: 1. Moderate LEFT hip osteoarthritis with degeneration and tearing of the superior labrum. 2. Partial-thickness insertional tears of the RIGHT gluteus  minimus tendon. Electronically Signed   By: Davina Poke D.O.   On: 01/30/2020 09:51     Assessment & Plan:   History of COVID-19 Memory loss and poor concentration:  Scored 3 on Mini Cog exam. Slow to answer questions during exam.   Plan: Will refer to neurology - memory loss and poor concentration  Irregular heart rhythm:  EKG in office today showed - NSR with incomplete right bundle branch block and nonspecific T wave abnormality and occasional PVC  Plan: will continue to monitor - please go to the ED with any chest pain  Will check labs and call with results  Handout given on nutritional rehabilitation  Please stay well hydrated   Sleep apnea: non compliant with CPAP  Plan: Please start wearing CPAP again nightly Goal of 4 hours or more usage per night Work on healthy weight Do not drive if drowsy  Muscle pain:  May take tylenol as needed Stay well  hydrated Stay active   Follow up:  Follow up in 1 month after seeing neurology     Patient Instructions  Will refer to neurology - memory loss and poor concentration  EKG in office today showed - NSR with incomplete right bundle branch block and nonspecific T wave abnormality and occasional PVC  Will check labs and call with results  Please start wearing CPAP again nightly Goal of 4 hours or more usage per night Work on healthy weight Do not drive if drowsy  Handout given on nutritional rehabilitation  Please stay well hydrated  Follow up:  Follow up in 1 month after seeing neurology      Fenton Foy, NP 02/14/2020

## 2020-02-13 NOTE — Patient Instructions (Addendum)
Will refer to neurology - memory loss and poor concentration  EKG in office today showed - NSR with incomplete right bundle branch block and nonspecific T wave abnormality and occasional PVC  Will check labs and call with results  Please start wearing CPAP again nightly Goal of 4 hours or more usage per night Work on healthy weight Do not drive if drowsy  Handout given on nutritional rehabilitation  Please stay well hydrated  Follow up:  Follow up in 1 month after seeing neurology

## 2020-02-14 DIAGNOSIS — I499 Cardiac arrhythmia, unspecified: Secondary | ICD-10-CM | POA: Insufficient documentation

## 2020-02-14 LAB — COMPREHENSIVE METABOLIC PANEL
ALT: 34 IU/L (ref 0–44)
AST: 36 IU/L (ref 0–40)
Albumin/Globulin Ratio: 1.6 (ref 1.2–2.2)
Albumin: 4.5 g/dL (ref 3.8–4.9)
Alkaline Phosphatase: 96 IU/L (ref 39–117)
BUN/Creatinine Ratio: 13 (ref 9–20)
BUN: 12 mg/dL (ref 6–24)
Bilirubin Total: 0.6 mg/dL (ref 0.0–1.2)
CO2: 19 mmol/L — ABNORMAL LOW (ref 20–29)
Calcium: 9.7 mg/dL (ref 8.7–10.2)
Chloride: 104 mmol/L (ref 96–106)
Creatinine, Ser: 0.95 mg/dL (ref 0.76–1.27)
GFR calc Af Amer: 106 mL/min/{1.73_m2} (ref >59)
GFR calc non Af Amer: 92 mL/min/{1.73_m2} (ref >59)
Globulin, Total: 2.9 g/dL (ref 1.5–4.5)
Glucose: 80 mg/dL (ref 65–99)
Potassium: 4.2 mmol/L (ref 3.5–5.2)
Sodium: 140 mmol/L (ref 134–144)
Total Protein: 7.4 g/dL (ref 6.0–8.5)

## 2020-02-14 LAB — CBC
Hematocrit: 43.3 % (ref 37.5–51.0)
Hemoglobin: 14.9 g/dL (ref 13.0–17.7)
MCH: 29.2 pg (ref 26.6–33.0)
MCHC: 34.4 g/dL (ref 31.5–35.7)
MCV: 85 fL (ref 79–97)
Platelets: 224 10*3/uL (ref 150–450)
RBC: 5.1 x10E6/uL (ref 4.14–5.80)
RDW: 14 % (ref 11.6–15.4)
WBC: 6.5 10*3/uL (ref 3.4–10.8)

## 2020-02-14 NOTE — Assessment & Plan Note (Signed)
Memory loss and poor concentration:  Scored 3 on Mini Cog exam. Slow to answer questions during exam.   Plan: Will refer to neurology - memory loss and poor concentration  Irregular heart rhythm:  EKG in office today showed - NSR with incomplete right bundle branch block and nonspecific T wave abnormality and occasional PVC  Plan: will continue to monitor - please go to the ED with any chest pain  Will check labs and call with results  Handout given on nutritional rehabilitation  Please stay well hydrated   Sleep apnea: non compliant with CPAP  Plan: Please start wearing CPAP again nightly Goal of 4 hours or more usage per night Work on healthy weight Do not drive if drowsy  Muscle pain:  May take tylenol as needed Stay well hydrated Stay active   Follow up:  Follow up in 1 month after seeing neurology

## 2020-02-15 NOTE — Progress Notes (Signed)
Patient notified of results, verbally understood. No additional questions.

## 2020-02-20 ENCOUNTER — Telehealth: Payer: Self-pay | Admitting: Internal Medicine

## 2020-02-20 NOTE — Telephone Encounter (Signed)
Patient is scheduled for surgery on the 9th. The next available would be on the 7th.

## 2020-02-20 NOTE — Telephone Encounter (Signed)
Schedule a phone visit for patient to discuss this with provider.

## 2020-02-20 NOTE — Telephone Encounter (Signed)
Patient calling in regards to weight loss medication as ortho will not see him until he loses weight. He is supposed to have surgery on 03/02/2020.

## 2020-02-20 NOTE — Telephone Encounter (Signed)
He has never discussed a weight loss medication with Dr. Juleen China. This would require an appointment.

## 2020-02-29 ENCOUNTER — Ambulatory Visit: Payer: 59 | Admitting: Internal Medicine

## 2020-02-29 ENCOUNTER — Encounter: Payer: Self-pay | Admitting: Internal Medicine

## 2020-02-29 ENCOUNTER — Telehealth (INDEPENDENT_AMBULATORY_CARE_PROVIDER_SITE_OTHER): Payer: 59 | Admitting: Internal Medicine

## 2020-02-29 DIAGNOSIS — Z7189 Other specified counseling: Secondary | ICD-10-CM

## 2020-02-29 MED ORDER — ORLISTAT 60 MG PO CAPS: 60.0000 mg | ORAL_CAPSULE | Freq: Three times a day (TID) | ORAL | 1 refills | Status: DC

## 2020-02-29 NOTE — Progress Notes (Signed)
Virtual Visit via Telephone Note  I connected with Darrell Conway, on 02/29/2020 at 3:51 PM by telephone due to the COVID-19 pandemic and verified that I am speaking with the correct person using two identifiers.   Consent: I discussed the limitations, risks, security and privacy concerns of performing an evaluation and management service by telephone and the availability of in person appointments. I also discussed with the patient that there may be a patient responsible charge related to this service. The patient expressed understanding and agreed to proceed.   Location of Patient: Home   Location of Provider: Clinic    Persons participating in Telemedicine visit: Skipper Machin [facility] Dr. Juleen China      History of Present Illness: Patient has a visit for weight loss. He was supposed to be scheduled for hip surgery but it was denied due to his weight. He was told he needs to lose about 15 lbs. Patient reports he tried walking regimen but it hurt too much because his hip hurts. In his diet he got rid of everything white. He reports he is limiting carbs and fat in his diet.    Past Medical History:  Diagnosis Date  . Anemia   . GERD (gastroesophageal reflux disease)   . Gout   . Hypertension   . Rheumatoid arthritis ([place])    No Known Allergies  Current Outpatient Medications on File Prior to Visit  Medication Sig Dispense Refill  . acetaminophen-codeine (TYLENOL #3) 300-30 MG tablet Take 1-2 tablets by mouth every 8 (eight) hours as needed for moderate pain. 30 tablet 0  . amLODipine (NORVASC) 10 MG tablet Take 1 tablet (10 mg total) by mouth daily. 90 tablet 0  . atorvastatin (LIPITOR) 40 MG tablet Take 1 tablet (40 mg total) by mouth daily. 90 tablet 0  . fluticasone (FLONASE) 50 MCG/ACT nasal spray Place 2 sprays into both nostrils daily. 16 g 6  . lisinopril (ZESTRIL) 20 MG tablet Take 1 tablet (20 mg total) by mouth daily. 90 tablet 0  . meloxicam (MOBIC) 15 MG  tablet Take 15 mg by mouth daily.    . Multiple Vitamins-Minerals (MENS MULTIVITAMIN PLUS PO) Take 1 tablet by mouth daily.    . Oxycodone HCl 10 MG TABS Take 10 mg by mouth 3 (three) times daily as needed.    . pantoprazole (PROTONIX) 40 MG tablet One pill(40mg ) 30 minutes before breakfast and one pill 30 minutes before dinner. 180 tablet 0  . sildenafil (VIAGRA) 50 MG tablet Take 1 tablet (50 mg total) by mouth daily as needed for erectile dysfunction. 10 tablet 0   No current facility-administered medications on file prior to visit.    Observations/Objective: NAD. Speaking clearly.  Work of breathing normal.  Alert and oriented. Mood appropriate.   Assessment and Plan: 1. Morbid (severe) obesity due to excess calories [facility]) Discussed that I would prefer to send patient to [facility] for obesity and weight loss. However, he declines referral right now and would like to start a medication for weight loss. Advised that Orlistat is not a long term weight loss option and is intended to be used in short term with diet changes/exercise. Counseled on potential side effects. Patient agreeable to starting medication despite these limitations. I informed him that if he does not have success with this method, I would recommend referral as we discussed.  - orlistat (ALLI) 60 MG capsule; Take 1 capsule (60 mg total) by mouth 3 (three) times daily with meals.  Dispense: 90 capsule; Refill: 1   Follow Up Instructions: PRN    I discussed the assessment and treatment plan with the patient. The patient was provided an opportunity to ask questions and all were answered. The patient agreed with the plan and demonstrated an understanding of the instructions.   The patient was advised to call back or seek an in-person evaluation if the symptoms worsen or if the condition fails to improve as anticipated.     I provided 12 minutes total of non-face-to-face time during this encounter including  median intraservice time, reviewing previous notes, investigations, ordering medications, medical decision making, coordinating care and patient verbalized understanding at the end of the visit.    Phill Myron, D.O. Primary Care at [facility]  02/29/2020, 3:51 PM

## 2020-03-01 ENCOUNTER — Telehealth: Payer: Self-pay | Admitting: Internal Medicine

## 2020-03-01 NOTE — Telephone Encounter (Signed)
Patient pharmacy requesting Phentermine rather than OTC medication

## 2020-03-01 NOTE — Telephone Encounter (Signed)
Please call patient and let him know that given his high blood pressure and other cardiac risk factors, phenteramine is not something I would feel safe prescribing for him.   Phill Myron, D.O. Primary Care at [facility]  03/01/2020, 4:10 PM

## 2020-03-02 NOTE — Telephone Encounter (Signed)
Left voice mail to call back 

## 2020-03-05 ENCOUNTER — Other Ambulatory Visit: Payer: 59

## 2020-03-15 ENCOUNTER — Inpatient Hospital Stay: Payer: 59 | Admitting: Orthopaedic Surgery

## 2020-03-20 ENCOUNTER — Ambulatory Visit: Payer: 59

## 2020-04-02 ENCOUNTER — Ambulatory Visit: Payer: BLUE CROSS/BLUE SHIELD | Admitting: Diagnostic Neuroimaging

## 2020-04-02 ENCOUNTER — Telehealth: Payer: Self-pay | Admitting: *Deleted

## 2020-04-02 NOTE — Telephone Encounter (Signed)
Patient was no show for new patient appointment today. 

## 2020-04-21 ENCOUNTER — Other Ambulatory Visit: Payer: Self-pay | Admitting: Nurse Practitioner

## 2020-04-21 DIAGNOSIS — I10 Essential (primary) hypertension: Secondary | ICD-10-CM

## 2020-04-24 ENCOUNTER — Telehealth: Payer: Self-pay | Admitting: Internal Medicine

## 2020-04-24 ENCOUNTER — Other Ambulatory Visit: Payer: Self-pay | Admitting: Internal Medicine

## 2020-04-24 NOTE — Telephone Encounter (Signed)
1) Medication(s) Requested (by name): lisinopril (ZESTRIL) 20 MG tablet   2) Pharmacy of Choice: [facility], May 3) Special Requests:   Approved medications will be sent to the pharmacy, we will reach out if there is an issue.  Requests made after 3pm may not be addressed until the following business day!  If a patient is unsure of the name of the medication(s) please note and ask patient to call back when they are able to provide all info, do not send to responsible party until all information is available!

## 2020-04-24 NOTE — Telephone Encounter (Signed)
Rx sent.   Phill Myron, D.O. Primary Care at [facility]  04/24/2020, 10:20 AM

## 2020-06-06 ENCOUNTER — Other Ambulatory Visit: Payer: Self-pay

## 2020-06-06 ENCOUNTER — Ambulatory Visit (INDEPENDENT_AMBULATORY_CARE_PROVIDER_SITE_OTHER): Payer: 59 | Admitting: Podiatry

## 2020-06-06 DIAGNOSIS — B351 Tinea unguium: Secondary | ICD-10-CM | POA: Diagnosis not present

## 2020-06-07 ENCOUNTER — Encounter: Payer: Self-pay | Admitting: Podiatry

## 2020-06-07 NOTE — Progress Notes (Signed)
  Subjective:  Patient ID: Darrell Conway, male    DOB: 09/22/1966,  MRN: [id number]  Chief Complaint  Patient presents with  . Nail Problem    Pt states "I think I have toenail fungus" bilateral 1-5 toenails, 5+ year duration. Pt denies pain/itching.   54 y.o. male returns for the above complaint.  Patient presents with complaint of thickened elongated dystrophic toenails x4.  Patient states that they are painful to touch.  He would like to discuss various treatment options.  He does not want them debrided down but would like to know if there is any ways to get them better.  He denies any other acute complaints.  He has not seen anyone else prior to seeing me.  Objective:  There were no vitals filed for this visit. Podiatric Exam: Vascular: dorsalis pedis and posterior tibial pulses are palpable bilateral. Capillary return is immediate. Temperature gradient is WNL. Skin turgor WNL  Sensorium: Normal Semmes Weinstein monofilament test. Normal tactile sensation bilaterally. Nail Exam: Pt has thick disfigured discolored nails with subungual debris noted bilateral entire hallux as well as left and second toe and left fifth toe pain on palpation to the nails. Ulcer Exam: There is no evidence of ulcer or pre-ulcerative changes or infection. Orthopedic Exam: Muscle tone and strength are WNL. No limitations in general ROM. No crepitus or effusions noted. HAV  B/L.  Hammer toes 2-5  B/L. Skin: No Porokeratosis. No infection or ulcers    Assessment & Plan:   1. Nail fungus   2. Onychomycosis     Patient was evaluated and treated and all questions answered.  Right hallux left second toe bilateral fifth digit onychomycosis/nail fungus  Educated the patient on the etiology of onychomycosis and various treatment options associated with improving the fungal load.  I explained to the patient that there is 3 treatment options available to treat the onychomycosis including topical, p.o., laser  treatment.  Patient has elected to go laser therapy.  I discussed with the patient the financial consideration of laser therapy.  Patient states understanding and he would like to proceed with laser therapy. -He will be scheduled to see Caryl Pina for laser therapy.   Boneta Lucks, DPM    Return for Toenail fungus.

## 2020-06-12 ENCOUNTER — Other Ambulatory Visit: Payer: Self-pay | Admitting: Nurse Practitioner

## 2020-06-12 DIAGNOSIS — K219 Gastro-esophageal reflux disease without esophagitis: Secondary | ICD-10-CM

## 2020-06-12 NOTE — Telephone Encounter (Signed)
[facility] does not refill for this practice. Routing to Dr Juleen China.

## 2020-06-16 ENCOUNTER — Other Ambulatory Visit: Payer: Self-pay | Admitting: Family Medicine

## 2020-06-16 DIAGNOSIS — I10 Essential (primary) hypertension: Secondary | ICD-10-CM

## 2020-06-16 NOTE — Telephone Encounter (Signed)
[facility] does not do prescriptions for this office. Routed to PCP.

## 2020-07-02 ENCOUNTER — Other Ambulatory Visit: Payer: 59

## 2020-07-14 ENCOUNTER — Other Ambulatory Visit: Payer: Self-pay | Admitting: Orthopaedic Surgery

## 2020-07-14 DIAGNOSIS — G8929 Other chronic pain: Secondary | ICD-10-CM

## 2020-07-26 ENCOUNTER — Telehealth: Payer: Self-pay | Admitting: Internal Medicine

## 2020-07-26 DIAGNOSIS — I10 Essential (primary) hypertension: Secondary | ICD-10-CM

## 2020-07-26 MED ORDER — LISINOPRIL 20 MG PO TABS: 20.0000 mg | ORAL_TABLET | Freq: Every day | ORAL | 0 refills | Status: DC

## 2020-07-26 NOTE — Telephone Encounter (Signed)
90 day supply sent to patient's pharmacy. He will need an appointment scheduled prior to him running out of medications(late November/early December).

## 2020-07-26 NOTE — Telephone Encounter (Signed)
Patients wife called in and requested for listed medication to be refilled lisinopril (ZESTRIL) 20 MG tablet [[id number]]

## 2020-07-29 ENCOUNTER — Ambulatory Visit
Admission: EM | Admit: 2020-07-29 | Discharge: 2020-07-29 | Disposition: A | Discharge status: Home / Self Care | Payer: 59 | Attending: Physician Assistant | Admitting: Physician Assistant

## 2020-07-29 ENCOUNTER — Ambulatory Visit (INDEPENDENT_AMBULATORY_CARE_PROVIDER_SITE_OTHER): Payer: 59

## 2020-07-29 ENCOUNTER — Encounter: Payer: Self-pay | Admitting: *Deleted

## 2020-07-29 ENCOUNTER — Other Ambulatory Visit: Payer: Self-pay

## 2020-07-29 DIAGNOSIS — W19XXXA Unspecified fall, initial encounter: Secondary | ICD-10-CM | POA: Diagnosis not present

## 2020-07-29 DIAGNOSIS — M25531 Pain in right wrist: Secondary | ICD-10-CM

## 2020-07-29 NOTE — Discharge Instructions (Signed)
X-ray negative for fracture or dislocation.  Continue Mobic, oxycodone as needed.  Ice compress, rest, wrist splint as needed.  This may take a few weeks to completely resolve, but should be feeling better each week.  Follow-up with sports medicine/orthopedics for further evaluation if symptoms not improving.

## 2020-07-29 NOTE — ED Triage Notes (Signed)
Pt reports tripping and falling on grass & concrete last night, injuring right wrist.  RUE CMS intact.  Swelling noted to wrist, extending into forearm.

## 2020-07-29 NOTE — ED Provider Notes (Signed)
Darrell Conway    CSN: [id number] Arrival date & time: 07/29/20  0858      History   Chief Complaint Chief Complaint  Patient presents with  . Fall    HPI Darrell Conway is a 54 y.o. male.   54 year old male with history of GERD, gout, HTN, RA comes in for right wrist pain after injury. States tripped with [place] injury of right arm. Pain to the ulna wrist, worse with ROM. Denies numbness/tingling.      Past Medical History:  Diagnosis Date  . Anemia   . GERD (gastroesophageal reflux disease)   . Gout   . Hypertension   . Rheumatoid arthritis [facility])     Patient Active Problem List   Diagnosis Date Noted  . Irregular heart rhythm 02/14/2020  . History of COVID-19 02/13/2020  . Memory loss 02/13/2020  . Poor concentration 02/13/2020  . Muscle pain 02/13/2020  . Unilateral primary osteoarthritis, left hip 01/31/2020  . Morbid (severe) obesity due to excess calories ([place]) 06/29/2019  . Bilateral primary osteoarthritis of knee 05/05/2019  . Primary osteoarthritis of both hips   . Arthralgia of multiple joints 04/26/2019  . Pain of both hip joints 04/26/2019  . Obstructive sleep apnea of adult 04/11/2019  . Laryngopharyngeal reflux (LPR) 04/11/2019    Past Surgical History:  Procedure Laterality Date  . HERNIA REPAIR         Home Medications    Prior to Admission medications   Medication Sig Start Date End Date Taking? Authorizing Provider  amLODipine (NORVASC) 10 MG tablet Take 1 tablet by mouth once daily 06/18/20  Yes Nicolette Bang, DO  lisinopril (ZESTRIL) 20 MG tablet Take 1 tablet (20 mg total) by mouth daily. 07/26/20  Yes Nicolette Bang, DO  meloxicam (MOBIC) 15 MG tablet Take 15 mg by mouth daily.   Yes [provider]  Oxycodone HCl 10 MG TABS Take 10 mg by mouth 3 (three) times daily as needed.   Yes [provider]  sildenafil (VIAGRA) 50 MG tablet Take 1 tablet (50 mg total) by mouth daily as needed  for erectile dysfunction. 01/05/20   Nicolette Bang, DO  atorvastatin (LIPITOR) 40 MG tablet Take 1 tablet (40 mg total) by mouth daily. 11/30/19 07/29/20  Gildardo Pounds, NP  fluticasone (FLONASE) 50 MCG/ACT nasal spray Place 2 sprays into both nostrils daily. 01/06/20 07/29/20  Nicolette Bang, DO  orlistat (ALLI) 60 MG capsule Take 1 capsule (60 mg total) by mouth 3 (three) times daily with meals. 02/29/20 07/29/20  Nicolette Bang, DO  pantoprazole (PROTONIX) 40 MG tablet TAKE 1 TABLET BY MOUTH  30 MINUTES BEFORE BREAKFAST AND DINNER TWICE DAILY 06/13/20 07/29/20  Nicolette Bang, DO    Family History Family History  Problem Relation Age of Onset  . Rheum arthritis Mother   . Rheum arthritis Sister     Social History Social History   Tobacco Use  . Smoking status: Former Smoker    Packs/day: 0.75    Types: Cigarettes    Quit date: 2015    Years since quitting: 6.6  . Smokeless tobacco: Never Used  Vaping Use  . Vaping Use: Never used  Substance Use Topics  . Alcohol use: Not Currently  . Drug use: No     Allergies   Patient has no known allergies.   Review of Systems Review of Systems  Reason unable to perform ROS: See HPI as above.  Physical Exam Triage Vital Signs ED Triage Vitals  Enc Vitals Group     BP 07/29/20 0918 (!) 158/103     Pulse Rate 07/29/20 0918 78     Resp 07/29/20 0918 18     Temp 07/29/20 0918 98.3 F (36.8 C)     Temp Source 07/29/20 0918 Oral     SpO2 07/29/20 0918 94 %     Weight --      Height --      Head Circumference --      Peak Flow --      Pain Score 07/29/20 0921 10     Pain Loc --      Pain Edu? --      Excl. in [place]? --    No data found.  Updated Vital Signs BP (!) 158/103 (BP Location: Left Arm)   Pulse 78   Temp 98.3 F (36.8 C) (Oral)   Resp 18   SpO2 94%   Physical Exam Constitutional:      General: He is not in acute distress.    Appearance: Normal appearance. He is  well-developed. He is not toxic-appearing or diaphoretic.  HENT:     Head: Normocephalic and atraumatic.  Eyes:     Conjunctiva/sclera: Conjunctivae normal.     Pupils: Pupils are equal, round, and reactive to light.  Pulmonary:     Effort: Pulmonary effort is normal. No respiratory distress.  Musculoskeletal:     Cervical back: Normal range of motion and neck supple.     Comments: Swelling to the radial aspect of distal wrist. No erythema, warmth, contusion. Tenderness to palpation along distal ulna. No tenderness to palpation of hand. Declined wrist ROM. Full ROM of fingers. NVI  Skin:    General: Skin is warm and dry.  Neurological:     Mental Status: He is alert and oriented to person, place, and time.      UC Treatments / Results  Labs (all labs ordered are listed, but only abnormal results are displayed) Labs Reviewed - No data to display  EKG   Radiology DG Wrist Complete Right  Result Date: 07/29/2020 CLINICAL DATA:  Right wrist pain after fall yesterday. EXAM: RIGHT WRIST - COMPLETE 3+ VIEW COMPARISON:  None. FINDINGS: There is no evidence of fracture or dislocation. There is no evidence of arthropathy or other focal bone abnormality. Soft tissues are unremarkable. IMPRESSION: Negative. Electronically Signed   By: Marijo Conception M.D.   On: 07/29/2020 09:39    Procedures Procedures (including critical Conway time)  Medications Ordered in UC Medications - No data to display  Initial Impression / Assessment and Plan / UC Course  I have reviewed the triage vital signs and the nursing notes.  Pertinent labs & imaging results that were available during my Conway of the patient were reviewed by me and considered in my medical decision making (see chart for details).    X-ray negative for fracture or dislocation.  To continue baseline pain medications.  Ice compress, rest, wrist pain during activity.  Expected course of healing discussed.  Return precautions given.  Final  Clinical Impressions(s) / UC Diagnoses   Final diagnoses:  Right wrist pain    ED Prescriptions    None     I have reviewed the PDMP during this encounter.   Ok Edwards, PA-C 07/29/20 1002

## 2020-09-07 ENCOUNTER — Telehealth: Payer: Self-pay | Admitting: Internal Medicine

## 2020-09-07 NOTE — Telephone Encounter (Signed)
Pt wife came in and dropped paperwork for pcp to fill out

## 2020-09-20 ENCOUNTER — Emergency Department (HOSPITAL_COMMUNITY)
Admission: EM | Admit: 2020-09-20 | Discharge: 2020-09-20 | Disposition: A | Discharge status: Home / Self Care | Payer: 59 | Attending: Emergency Medicine | Admitting: Emergency Medicine

## 2020-09-20 ENCOUNTER — Other Ambulatory Visit: Payer: Self-pay

## 2020-09-20 ENCOUNTER — Encounter (HOSPITAL_COMMUNITY): Payer: Self-pay

## 2020-09-20 DIAGNOSIS — R319 Hematuria, unspecified: Secondary | ICD-10-CM | POA: Diagnosis not present

## 2020-09-20 DIAGNOSIS — R109 Unspecified abdominal pain: Secondary | ICD-10-CM | POA: Insufficient documentation

## 2020-09-20 DIAGNOSIS — Z79899 Other long term (current) drug therapy: Secondary | ICD-10-CM | POA: Diagnosis not present

## 2020-09-20 DIAGNOSIS — Z87891 Personal history of nicotine dependence: Secondary | ICD-10-CM | POA: Diagnosis not present

## 2020-09-20 DIAGNOSIS — K219 Gastro-esophageal reflux disease without esophagitis: Secondary | ICD-10-CM | POA: Insufficient documentation

## 2020-09-20 DIAGNOSIS — I10 Essential (primary) hypertension: Secondary | ICD-10-CM | POA: Insufficient documentation

## 2020-09-20 DIAGNOSIS — R350 Frequency of micturition: Secondary | ICD-10-CM | POA: Diagnosis not present

## 2020-09-20 LAB — URINALYSIS, ROUTINE W REFLEX MICROSCOPIC
Bilirubin Urine: NEGATIVE
Glucose, UA: NEGATIVE mg/dL
Hgb urine dipstick: NEGATIVE
Ketones, ur: NEGATIVE mg/dL
Leukocytes,Ua: NEGATIVE
Nitrite: NEGATIVE
Protein, ur: NEGATIVE mg/dL
Specific Gravity, Urine: 1.018 (ref 1.005–1.030)
pH: 5 (ref 5.0–8.0)

## 2020-09-20 NOTE — ED Provider Notes (Signed)
[facility] DEPT Provider Note   CSN: [id number] Arrival date & time: 09/20/20  1348     History Chief Complaint  Patient presents with  . Hematuria  . Abdominal Pain    Darrell Conway is a 54 y.o. male.  HPI Presents for evaluation of 2 episodes of blood in urine over the last 2 days.  He has had some urinary frequency as well.  He denies dysuria, fever, chills, nausea, vomiting, trauma to the genital region.  No prior similar problem.  He takes lisinopril for blood pressure.  He has an appointment with his PCP tomorrow.  There are no other known modifying factors.    Past Medical History:  Diagnosis Date  . Anemia   . GERD (gastroesophageal reflux disease)   . Gout   . Hypertension   . Rheumatoid arthritis [facility])     Patient Active Problem List   Diagnosis Date Noted  . Irregular heart rhythm 02/14/2020  . History of COVID-19 02/13/2020  . Memory loss 02/13/2020  . Poor concentration 02/13/2020  . Muscle pain 02/13/2020  . Unilateral primary osteoarthritis, left hip 01/31/2020  . Morbid (severe) obesity due to excess calories ([place]) 06/29/2019  . Bilateral primary osteoarthritis of knee 05/05/2019  . Primary osteoarthritis of both hips   . Arthralgia of multiple joints 04/26/2019  . Pain of both hip joints 04/26/2019  . Obstructive sleep apnea of adult 04/11/2019  . Laryngopharyngeal reflux (LPR) 04/11/2019    Past Surgical History:  Procedure Laterality Date  . HERNIA REPAIR         Family History  Problem Relation Age of Onset  . Rheum arthritis Mother   . Rheum arthritis Sister     Social History   Tobacco Use  . Smoking status: Former Smoker    Packs/day: 0.75    Types: Cigarettes    Quit date: 2015    Years since quitting: 6.8  . Smokeless tobacco: Never Used  Vaping Use  . Vaping Use: Never used  Substance Use Topics  . Alcohol use: Not Currently  . Drug use: No    Home Medications Prior to Admission  medications   Medication Sig Start Date End Date Taking? Authorizing Provider  amLODipine (NORVASC) 10 MG tablet Take 1 tablet by mouth once daily 06/18/20   Nicolette Bang, DO  lisinopril (ZESTRIL) 20 MG tablet Take 1 tablet (20 mg total) by mouth daily. 07/26/20   Nicolette Bang, DO  meloxicam (MOBIC) 15 MG tablet Take 15 mg by mouth daily.    [provider]  Oxycodone HCl 10 MG TABS Take 10 mg by mouth 3 (three) times daily as needed.    [provider]  sildenafil (VIAGRA) 50 MG tablet Take 1 tablet (50 mg total) by mouth daily as needed for erectile dysfunction. 01/05/20   Nicolette Bang, DO  atorvastatin (LIPITOR) 40 MG tablet Take 1 tablet (40 mg total) by mouth daily. 11/30/19 07/29/20  Gildardo Pounds, NP  fluticasone (FLONASE) 50 MCG/ACT nasal spray Place 2 sprays into both nostrils daily. 01/06/20 07/29/20  Nicolette Bang, DO  orlistat (ALLI) 60 MG capsule Take 1 capsule (60 mg total) by mouth 3 (three) times daily with meals. 02/29/20 07/29/20  Nicolette Bang, DO  pantoprazole (PROTONIX) 40 MG tablet TAKE 1 TABLET BY MOUTH  30 MINUTES BEFORE BREAKFAST AND DINNER TWICE DAILY 06/13/20 07/29/20  Nicolette Bang, DO    Allergies    Patient has no known  allergies.  Review of Systems   Review of Systems  All other systems reviewed and are negative.   Physical Exam Updated Vital Signs BP (!) 148/101 (BP Location: Right Arm)   Pulse 76   Temp 98.2 F (36.8 C) (Oral)   Resp 12   Ht 5' 11.5" (1.816 m)   Wt 131.5 kg   SpO2 98%   BMI 39.88 kg/m   Physical Exam Vitals and nursing note reviewed.  Constitutional:      General: He is not in acute distress.    Appearance: He is well-developed. He is not ill-appearing, toxic-appearing or diaphoretic.  HENT:     Head: Normocephalic and atraumatic.     Right Ear: External ear normal.     Left Ear: External ear normal.  Eyes:     Conjunctiva/sclera: Conjunctivae  normal.     Pupils: Pupils are equal, round, and reactive to light.  Neck:     Trachea: Phonation normal.  Cardiovascular:     Rate and Rhythm: Normal rate.  Pulmonary:     Effort: Pulmonary effort is normal.  Abdominal:     General: There is no distension.     Palpations: Abdomen is soft. There is no mass.     Tenderness: There is no abdominal tenderness.     Hernia: No hernia is present.  Genitourinary:    Penis: Normal.      Testes: Normal.     Comments: Mild tenderness of the bilateral epididymitis, left greater than right.  No palpable mass in the scrotum. Musculoskeletal:        General: Normal range of motion.     Cervical back: Normal range of motion and neck supple.  Skin:    General: Skin is warm and dry.  Neurological:     Mental Status: He is alert and oriented to person, place, and time.     Cranial Nerves: No cranial nerve deficit.     Sensory: No sensory deficit.     Motor: No abnormal muscle tone.     Coordination: Coordination normal.  Psychiatric:        Mood and Affect: Mood normal.        Behavior: Behavior normal.        Thought Content: Thought content normal.        Judgment: Judgment normal.     ED Results / Procedures / Treatments   Labs (all labs ordered are listed, but only abnormal results are displayed) Labs Reviewed  URINALYSIS, ROUTINE W REFLEX MICROSCOPIC    EKG None  Radiology No results found.  Procedures Procedures (including critical care time)  Medications Ordered in ED Medications - No data to display  ED Course  I have reviewed the triage vital signs and the nursing notes.  Pertinent labs & imaging results that were available during my care of the patient were reviewed by me and considered in my medical decision making (see chart for details).    MDM Rules/Calculators/A&P                           Patient Vitals for the past 24 hrs:  BP Temp Temp src Pulse Resp SpO2 Height Weight  09/20/20 1448 (!) 148/101 98.2  F (36.8 C) Oral 76 12 98 % -- --  09/20/20 1357 -- -- -- -- -- -- 5' 11.5" (1.816 m) 131.5 kg  09/20/20 1355 (!) 148/100 98.2 F (36.8 C) Oral 78 16 96 % -- --  3:31 PM Reevaluation with update and discussion. After initial assessment and treatment, an updated evaluation reveals no further complaints, findings cussed with patient and wife, all questions were answered. Daleen Bo   Medical Decision Making:  This patient is presenting for evaluation of blood in urine, which does require a range of treatment options, and is a complaint that involves a moderate risk of morbidity and mortality. The differential diagnoses include infection, traumatic injury, kidney stone. I decided to review old records, and in summary patient with 2 episodes of hematuria, without significant associated symptoms.  I do not require additional historical information from anyone.  Clinical Laboratory Tests Ordered, included Urinalysis. Review indicates normal findings.   Critical Interventions-clinical evaluation, laboratory testing, observation reassessment  After These Interventions, the Patient was reevaluated and was found stable for discharge.  2 episodes of hematuria, with negative urinalysis.  Possible epididymitis however examination is rather benign.  Doubt obstructing kidney stone.  Patient is otherwise well.  He has mild blood pressure elevation and is currently taking lisinopril.  No decays for further ED intervention, or hospitalization at this time.  CRITICAL CARE-no Performed by: Daleen Bo  Nursing Notes Reviewed/ Care Coordinated Applicable Imaging Reviewed Interpretation of Laboratory Data incorporated into ED treatment  The patient appears reasonably screened and/or stabilized for discharge and I doubt any other medical condition or other [facility] requiring further screening, evaluation, or treatment in the ED at this time prior to discharge.  Plan: Home Medications-OTC analgesia of  choice continue lisinopril; Home Treatments-drink plenty of fluids; return here if the recommended treatment, does not improve the symptoms; Recommended follow up-PCP follow-up as needed     Final Clinical Impression(s) / ED Diagnoses Final diagnoses:  None    Rx / DC Orders ED Discharge Orders    None       Daleen Bo, MD 09/20/20 1533

## 2020-09-20 NOTE — Discharge Instructions (Addendum)
There is no sign of blood in the urine sample that we obtained today.  Sometimes bleeding can come and go.  Since you have mild pain in your scrotum, you may have an inflamed epididymis that caused bleeding.  Try using Motrin or Tylenol for pain.  If you continue to have bleeding, or develop other symptoms including fever, increased pain or burning with urination, see your doctor.  Your blood pressure was mildly elevated today.  Follow-up with your doctor for ongoing management of your blood pressure within the next week.

## 2020-09-20 NOTE — ED Notes (Addendum)
Pt ambulatory to bathroom without assistance with steady gait.  PT reports intermittent lower abdominal pain for a couple of days that is not present upon arrival. Pt also reports one occurrence of blood in urine on Monday. Pt denies any symptoms upon arrival to ER.

## 2020-09-20 NOTE — ED Triage Notes (Signed)
Patient c/o bilateral lower abdominal pain and hematuria x 3 days.

## 2020-09-30 ENCOUNTER — Ambulatory Visit
Admission: EM | Admit: 2020-09-30 | Discharge: 2020-09-30 | Disposition: A | Discharge status: Home / Self Care | Payer: 59 | Attending: Physician Assistant | Admitting: Physician Assistant

## 2020-09-30 ENCOUNTER — Encounter: Payer: Self-pay | Admitting: Physician Assistant

## 2020-09-30 ENCOUNTER — Other Ambulatory Visit: Payer: Self-pay

## 2020-09-30 DIAGNOSIS — L509 Urticaria, unspecified: Secondary | ICD-10-CM | POA: Diagnosis not present

## 2020-09-30 MED ORDER — PREDNISONE 50 MG PO TABS: 50.0000 mg | ORAL_TABLET | Freq: Every day | ORAL | 0 refills | Status: DC

## 2020-09-30 NOTE — ED Provider Notes (Signed)
[facility]    CSN: [id number] Arrival date & time: 09/30/20  0801      History   Chief Complaint Chief Complaint  Patient presents with  . Rash    HPI Darrell Conway is a 54 y.o. male.   54 year old male comes in for 3 day history of generalized hives with itching. States this first started 1.5 weeks ago, and has been coming and going. No resolution these past 3 days despite antihistamine use. No changes to medications. Possibly had detergent change.      Past Medical History:  Diagnosis Date  . Anemia   . GERD (gastroesophageal reflux disease)   . Gout   . Hypertension   . Rheumatoid arthritis [facility])     Patient Active Problem List   Diagnosis Date Noted  . Irregular heart rhythm 02/14/2020  . History of COVID-19 02/13/2020  . Memory loss 02/13/2020  . Poor concentration 02/13/2020  . Muscle pain 02/13/2020  . Unilateral primary osteoarthritis, left hip 01/31/2020  . Morbid (severe) obesity due to excess calories ([place]) 06/29/2019  . Bilateral primary osteoarthritis of knee 05/05/2019  . Primary osteoarthritis of both hips   . Arthralgia of multiple joints 04/26/2019  . Pain of both hip joints 04/26/2019  . Obstructive sleep apnea of adult 04/11/2019  . Laryngopharyngeal reflux (LPR) 04/11/2019    Past Surgical History:  Procedure Laterality Date  . HERNIA REPAIR         Home Medications    Prior to Admission medications   Medication Sig Start Date End Date Taking? Authorizing Provider  amLODipine (NORVASC) 10 MG tablet Take 1 tablet by mouth once daily 06/18/20   Nicolette Bang, DO  lisinopril (ZESTRIL) 20 MG tablet Take 1 tablet (20 mg total) by mouth daily. 07/26/20   Nicolette Bang, DO  meloxicam (MOBIC) 15 MG tablet Take 15 mg by mouth daily.    [provider]  Oxycodone HCl 10 MG TABS Take 10 mg by mouth 3 (three) times daily as needed.    [provider]  predniSONE (DELTASONE) 50 MG tablet  Take 1 tablet (50 mg total) by mouth daily with breakfast. 09/30/20   Tasia Catchings, Jameika Kinn V, PA-C  sildenafil (VIAGRA) 50 MG tablet Take 1 tablet (50 mg total) by mouth daily as needed for erectile dysfunction. 01/05/20   Nicolette Bang, DO  atorvastatin (LIPITOR) 40 MG tablet Take 1 tablet (40 mg total) by mouth daily. 11/30/19 07/29/20  Gildardo Pounds, NP  fluticasone (FLONASE) 50 MCG/ACT nasal spray Place 2 sprays into both nostrils daily. 01/06/20 07/29/20  Nicolette Bang, DO  orlistat (ALLI) 60 MG capsule Take 1 capsule (60 mg total) by mouth 3 (three) times daily with meals. 02/29/20 07/29/20  Nicolette Bang, DO  pantoprazole (PROTONIX) 40 MG tablet TAKE 1 TABLET BY MOUTH  30 MINUTES BEFORE BREAKFAST AND DINNER TWICE DAILY 06/13/20 07/29/20  Nicolette Bang, DO    Family History Family History  Problem Relation Age of Onset  . Rheum arthritis Mother   . Rheum arthritis Sister     Social History Social History   Tobacco Use  . Smoking status: Former Smoker    Packs/day: 0.75    Types: Cigarettes    Quit date: 2015    Years since quitting: 6.8  . Smokeless tobacco: Never Used  Vaping Use  . Vaping Use: Never used  Substance Use Topics  . Alcohol use: Not Currently  . Drug use: No  Allergies   Patient has no known allergies.   Review of Systems Review of Systems  Reason unable to perform ROS: See HPI as above.     Physical Exam Triage Vital Signs ED Triage Vitals  Enc Vitals Group     BP 09/30/20 0811 (!) 168/114     Pulse Rate 09/30/20 0811 76     Resp 09/30/20 0811 18     Temp 09/30/20 0811 98.3 F (36.8 C)     Temp Source 09/30/20 0811 Oral     SpO2 09/30/20 0811 95 %     Weight --      Height --      Head Circumference --      Peak Flow --      Pain Score 09/30/20 0812 0     Pain Loc --      Pain Edu? --      Excl. in [place]? --    No data found.  Updated Vital Signs BP (!) 168/114 (BP Location: Left Arm)   Pulse 76   Temp  98.3 F (36.8 C) (Oral)   Resp 18   SpO2 95%   Physical Exam Constitutional:      General: He is not in acute distress.    Appearance: Normal appearance. He is well-developed. He is not toxic-appearing or diaphoretic.  HENT:     Head: Normocephalic and atraumatic.  Eyes:     Conjunctiva/sclera: Conjunctivae normal.     Pupils: Pupils are equal, round, and reactive to light.  Pulmonary:     Effort: Pulmonary effort is normal. No respiratory distress.  Musculoskeletal:     Cervical back: Normal range of motion and neck supple.  Skin:    General: Skin is warm and dry.     Comments: Urticarial rash to the trunk  Neurological:     Mental Status: He is alert and oriented to person, place, and time.      UC Treatments / Results  Labs (all labs ordered are listed, but only abnormal results are displayed) Labs Reviewed - No data to display  EKG   Radiology No results found.  Procedures Procedures (including critical care time)  Medications Ordered in UC Medications - No data to display  Initial Impression / Assessment and Plan / UC Course  I have reviewed the triage vital signs and the nursing notes.  Pertinent labs & imaging results that were available during my care of the patient were reviewed by me and considered in my medical decision making (see chart for details).    Prednisone as directed. Continue antihistamine. Discontinue detergent for now and monitor. Return precautions given.  Final Clinical Impressions(s) / UC Diagnoses   Final diagnoses:  Urticaria    ED Prescriptions    Medication Sig Dispense Auth. Provider   predniSONE (DELTASONE) 50 MG tablet Take 1 tablet (50 mg total) by mouth daily with breakfast. 5 tablet Ok Edwards, PA-C     PDMP not reviewed this encounter.   Ok Edwards, PA-C 09/30/20 [phone]

## 2020-09-30 NOTE — Discharge Instructions (Signed)
Prednisone as directed. Continue allergy medicine such as benadryl or zyrtec for itching. Continue to monitor any product changes. Follow up with PCP for further evaluation.

## 2020-09-30 NOTE — ED Triage Notes (Signed)
Pt here for generalized hives with itching x 3 days; pt denies knowing cause

## 2020-10-02 ENCOUNTER — Other Ambulatory Visit: Payer: Self-pay

## 2020-10-02 ENCOUNTER — Encounter: Payer: Self-pay | Admitting: Internal Medicine

## 2020-10-02 ENCOUNTER — Ambulatory Visit (INDEPENDENT_AMBULATORY_CARE_PROVIDER_SITE_OTHER): Payer: 59 | Admitting: Internal Medicine

## 2020-10-02 VITALS — BP 160/98 | HR 97 | Temp 97.3°F | Resp 17 | Wt 291.0 lb

## 2020-10-02 DIAGNOSIS — I10 Essential (primary) hypertension: Secondary | ICD-10-CM

## 2020-10-02 DIAGNOSIS — E785 Hyperlipidemia, unspecified: Secondary | ICD-10-CM | POA: Diagnosis not present

## 2020-10-02 DIAGNOSIS — Z23 Encounter for immunization: Secondary | ICD-10-CM

## 2020-10-02 DIAGNOSIS — Z1159 Encounter for screening for other viral diseases: Secondary | ICD-10-CM | POA: Diagnosis not present

## 2020-10-02 DIAGNOSIS — Z125 Encounter for screening for malignant neoplasm of prostate: Secondary | ICD-10-CM

## 2020-10-02 DIAGNOSIS — L500 Allergic urticaria: Secondary | ICD-10-CM

## 2020-10-02 DIAGNOSIS — M2550 Pain in unspecified joint: Secondary | ICD-10-CM

## 2020-10-02 MED ORDER — LISINOPRIL 20 MG PO TABS: 20.0000 mg | ORAL_TABLET | Freq: Every day | ORAL | 0 refills | Status: DC

## 2020-10-02 MED ORDER — AMLODIPINE BESYLATE 10 MG PO TABS: 10.0000 mg | ORAL_TABLET | Freq: Every day | ORAL | 0 refills | Status: DC

## 2020-10-02 NOTE — Progress Notes (Signed)
Subjective:    Darrell Conway - 54 y.o. male MRN [id number]  Date of birth: 04-18-1966  HPI  Darrell Conway is here for follow up. Has history of HTN. Has been prescribed Amlodipine 10 mg and Lisinopril 20 mg. Has been out of Amlodipine for about 3 weeks. Started taking double of Lisinopril tablets for about the past week.      Health Maintenance:  Health Maintenance Due  Topic Date Due  . Hepatitis C Screening  Never done  . COVID-19 Vaccine (1) Never done  . INFLUENZA VACCINE  06/24/2020    -  reports that he quit smoking about 6 years ago. His smoking use included cigarettes. He smoked 0.75 packs per day. He has never used smokeless tobacco. - Review of Systems: Per HPI. - Past Medical History: Patient Active Problem List   Diagnosis Date Noted  . Irregular heart rhythm 02/14/2020  . History of COVID-19 02/13/2020  . Memory loss 02/13/2020  . Poor concentration 02/13/2020  . Muscle pain 02/13/2020  . Unilateral primary osteoarthritis, left hip 01/31/2020  . Morbid (severe) obesity due to excess calories ([place]) 06/29/2019  . Bilateral primary osteoarthritis of knee 05/05/2019  . Primary osteoarthritis of both hips   . Arthralgia of multiple joints 04/26/2019  . Pain of both hip joints 04/26/2019  . Obstructive sleep apnea of adult 04/11/2019  . Laryngopharyngeal reflux (LPR) 04/11/2019   - Medications: reviewed and updated   Objective:   Physical Exam BP (!) 160/98   Pulse 97   Temp (!) 97.3 F (36.3 C) (Temporal)   Resp 17   Wt 291 lb (132 kg)   SpO2 96%   BMI 40.02 kg/m  Physical Exam Constitutional:      General: He is not in acute distress.    Appearance: He is not diaphoretic.  HENT:     Head: Normocephalic and atraumatic.  Eyes:     Conjunctiva/sclera: Conjunctivae normal.  Cardiovascular:     Rate and Rhythm: Normal rate and regular rhythm.     Heart sounds: Normal heart sounds. No murmur heard.   Pulmonary:     Effort: Pulmonary effort is  normal. No respiratory distress.     Breath sounds: Normal breath sounds.  Musculoskeletal:        General: Normal range of motion.  Skin:    General: Skin is warm and dry.  Neurological:     Mental Status: He is alert and oriented to person, place, and time.  Psychiatric:        Mood and Affect: Affect normal.        Judgment: Judgment normal.            Assessment & Plan:   1. Essential hypertension BP above goal. Asymptomatic. Restart Amlodipine. Continue Lisinopril. Monitor BMET due to patient taking double dose of Lisinopril. Return for BP monitoring.  - CBC with Differential - Comprehensive metabolic panel - LDL Cholesterol, Direct - amLODipine (NORVASC) 10 MG tablet; Take 1 tablet (10 mg total) by mouth daily.  Dispense: 90 tablet; Refill: 0 - lisinopril (ZESTRIL) 20 MG tablet; Take 1 tablet (20 mg total) by mouth daily.  Dispense: 90 tablet; Refill: 0  2. Dyslipidemia Not on statin therapy. Follow LDL.  - LDL Cholesterol, Direct  3. Need for hepatitis C screening test - HCV Ab w/Rflx to Verification  4. Screening PSA (prostate specific antigen) - PSA  5. Needs flu shot - Flu Vaccine QUAD 6+ mos PF IM (Fluarix Quad PF)  6.  Allergic urticaria History of breaking out in hives from unknown etiology. Was seen at Urgent Care for this complaint. No changes in soaps, detergents, body products.  - Allergen food profile specific IgE  7. Morbid (severe) obesity due to excess calories (HCC) - Amb Ref to Medical Weight Management  8. Arthralgia of multiple joints Patient requests referral to pain clinic. Has known OA of hops and knees bilaterally.  - Ambulatory referral to [place], D.O. 10/02/2020, 2:32 PM Primary Care at [facility]

## 2020-10-04 LAB — ALLERGEN FOOD PROFILE SPECIFIC IGE
Allergen Apple, IgE: <0.1 kU/L
Allergen Corn, IgE: <0.1 kU/L
Allergen Tomato, IgE: <0.1 kU/L
Chicken IgE: <0.1 kU/L
Codfish IgE: <0.1 kU/L
Egg White IgE: 0.56 kU/L — AB
IgE (Immunoglobulin E), Serum: 149 IU/mL (ref 6–495)
Milk IgE: 0.39 kU/L — AB
Orange: <0.1 kU/L
Peanut IgE: <0.1 kU/L
Shrimp IgE: 2.31 kU/L — AB
Soybean IgE: <0.1 kU/L
Tuna: <0.1 kU/L
Wheat IgE: 0.11 kU/L — AB

## 2020-10-04 LAB — COMPREHENSIVE METABOLIC PANEL
ALT: 19 IU/L (ref 0–44)
AST: 29 IU/L (ref 0–40)
Albumin/Globulin Ratio: 1.3 (ref 1.2–2.2)
Albumin: 4.4 g/dL (ref 3.8–4.9)
Alkaline Phosphatase: 81 IU/L (ref 44–121)
BUN/Creatinine Ratio: 24 — ABNORMAL HIGH (ref 9–20)
BUN: 18 mg/dL (ref 6–24)
Bilirubin Total: 0.3 mg/dL (ref 0.0–1.2)
CO2: 19 mmol/L — ABNORMAL LOW (ref 20–29)
Calcium: 9.9 mg/dL (ref 8.7–10.2)
Chloride: 102 mmol/L (ref 96–106)
Creatinine, Ser: 0.76 mg/dL (ref 0.76–1.27)
GFR calc Af Amer: 120 mL/min/{1.73_m2} (ref >59)
GFR calc non Af Amer: 104 mL/min/{1.73_m2} (ref >59)
Globulin, Total: 3.3 g/dL (ref 1.5–4.5)
Glucose: 98 mg/dL (ref 65–99)
Potassium: 4.5 mmol/L (ref 3.5–5.2)
Sodium: 138 mmol/L (ref 134–144)
Total Protein: 7.7 g/dL (ref 6.0–8.5)

## 2020-10-04 LAB — CBC WITH DIFFERENTIAL/PLATELET
Basophils Absolute: 0 10*3/uL (ref 0.0–0.2)
Basos: 1 %
EOS (ABSOLUTE): 0 10*3/uL (ref 0.0–0.4)
Eos: 0 %
Hematocrit: 45.7 % (ref 37.5–51.0)
Hemoglobin: 15.2 g/dL (ref 13.0–17.7)
Immature Grans (Abs): 0.1 10*3/uL (ref 0.0–0.1)
Immature Granulocytes: 1 %
Lymphocytes Absolute: 1.3 10*3/uL (ref 0.7–3.1)
Lymphs: 15 %
MCH: 28.4 pg (ref 26.6–33.0)
MCHC: 33.3 g/dL (ref 31.5–35.7)
MCV: 85 fL (ref 79–97)
Monocytes Absolute: 0.4 10*3/uL (ref 0.1–0.9)
Monocytes: 4 %
Neutrophils Absolute: 6.7 10*3/uL (ref 1.4–7.0)
Neutrophils: 79 %
Platelets: 285 10*3/uL (ref 150–450)
RBC: 5.36 x10E6/uL (ref 4.14–5.80)
RDW: 13.3 % (ref 11.6–15.4)
WBC: 8.5 10*3/uL (ref 3.4–10.8)

## 2020-10-04 LAB — HCV AB W/RFLX TO VERIFICATION: HCV Ab: <0.1 s/co ratio (ref 0.0–0.9)

## 2020-10-04 LAB — LDL CHOLESTEROL, DIRECT: LDL Direct: 193 mg/dL — ABNORMAL HIGH (ref 0–99)

## 2020-10-04 LAB — HCV INTERPRETATION

## 2020-10-04 LAB — PSA: Prostate Specific Ag, Serum: <0.1 ng/mL (ref 0.0–4.0)

## 2020-10-05 ENCOUNTER — Other Ambulatory Visit: Payer: Self-pay | Admitting: Internal Medicine

## 2020-10-05 MED ORDER — ATORVASTATIN CALCIUM 20 MG PO TABS: 20.0000 mg | ORAL_TABLET | Freq: Every day | ORAL | 3 refills | Status: DC

## 2020-10-12 ENCOUNTER — Telehealth (INDEPENDENT_AMBULATORY_CARE_PROVIDER_SITE_OTHER): Payer: Self-pay

## 2020-10-12 NOTE — Telephone Encounter (Signed)
Contacted patient. After verifying date of birth he was informed that his CBC and CMET were normal. PSA, hepatitis C and HIV are negative. LDL is very high which puts you at greater risk for stroke and heart attack. Patient has picked up atorvastatin and started back taking. Advised patient to get OTC 81 mg aspirin and take daily. 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 Questionable allergy to wheat, but HIGH allergy to shrimp. He did not have any questions and verbalized understanding of all results. Nat Christen, CMA

## 2020-10-12 NOTE — Telephone Encounter (Signed)
-----   Message from Nicolette Bang, DO sent at 10/05/2020  8:59 AM EST ----- Please call [name] about results. CBC and CMET normal. LDL is very high at 193. Risk of heart attack or stroke in 10 years is about 15%. Need to start cholesterol lowering medication. Sent in Rx for Lipitor and should start 81 mg EC OTC ASA.    PSA is neg. Screening Hep C and HIV neg.   Food allergen testings shows low-moderate allergy to eggs. Low allergy to milk. Questionable allergy to wheat. 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    The 10-year ASCVD risk score Mikey Bussing DC Brooke Bonito., et al., 2013) is: 14.8%   Values used to calculate the score:     Age: 54 years     Sex: Male     Is Non-Hispanic African American: Yes     Diabetic: No     Tobacco smoker: No     Systolic Blood Pressure: 329 mmHg     Is BP treated: Yes     HDL Cholesterol: 38 mg/dL     Total Cholesterol: 145 mg/dL   Thanks,  Phill Myron, D.O. Primary Care at [facility]  10/05/2020, 8:54 AM

## 2020-10-15 DIAGNOSIS — M545 Low back pain, unspecified: Secondary | ICD-10-CM | POA: Insufficient documentation

## 2020-11-09 DIAGNOSIS — M25561 Pain in right knee: Secondary | ICD-10-CM | POA: Insufficient documentation

## 2020-11-09 DIAGNOSIS — M1711 Unilateral primary osteoarthritis, right knee: Secondary | ICD-10-CM | POA: Insufficient documentation

## 2020-12-31 ENCOUNTER — Other Ambulatory Visit: Payer: Self-pay

## 2020-12-31 ENCOUNTER — Encounter (INDEPENDENT_AMBULATORY_CARE_PROVIDER_SITE_OTHER): Payer: Self-pay | Admitting: Family Medicine

## 2020-12-31 ENCOUNTER — Ambulatory Visit (INDEPENDENT_AMBULATORY_CARE_PROVIDER_SITE_OTHER): Payer: 59 | Admitting: Family Medicine

## 2020-12-31 VITALS — BP 136/85 | HR 75 | Temp 98.1°F | Ht 71.0 in | Wt 290.0 lb

## 2020-12-31 DIAGNOSIS — I10 Essential (primary) hypertension: Secondary | ICD-10-CM | POA: Diagnosis not present

## 2020-12-31 DIAGNOSIS — R5383 Other fatigue: Secondary | ICD-10-CM

## 2020-12-31 DIAGNOSIS — Z0289 Encounter for other administrative examinations: Secondary | ICD-10-CM

## 2020-12-31 DIAGNOSIS — Z9189 Other specified personal risk factors, not elsewhere classified: Secondary | ICD-10-CM | POA: Diagnosis not present

## 2020-12-31 DIAGNOSIS — Z1331 Encounter for screening for depression: Secondary | ICD-10-CM | POA: Diagnosis not present

## 2020-12-31 DIAGNOSIS — M1612 Unilateral primary osteoarthritis, left hip: Secondary | ICD-10-CM

## 2020-12-31 DIAGNOSIS — R7303 Prediabetes: Secondary | ICD-10-CM

## 2020-12-31 DIAGNOSIS — G4733 Obstructive sleep apnea (adult) (pediatric): Secondary | ICD-10-CM

## 2020-12-31 DIAGNOSIS — E785 Hyperlipidemia, unspecified: Secondary | ICD-10-CM | POA: Diagnosis not present

## 2020-12-31 DIAGNOSIS — R0602 Shortness of breath: Secondary | ICD-10-CM | POA: Diagnosis not present

## 2020-12-31 DIAGNOSIS — Z6841 Body Mass Index (BMI) 40.0 and over, adult: Secondary | ICD-10-CM

## 2021-01-01 LAB — LIPID PANEL WITH LDL/HDL RATIO
Cholesterol, Total: 151 mg/dL (ref 100–199)
HDL: 40 mg/dL (ref >39)
LDL Chol Calc (NIH): 97 mg/dL (ref 0–99)
LDL/HDL Ratio: 2.4 ratio (ref 0.0–3.6)
Triglycerides: 70 mg/dL (ref 0–149)
VLDL Cholesterol Cal: 14 mg/dL (ref 5–40)

## 2021-01-01 LAB — FOLATE: Folate: 17.1 ng/mL (ref >3.0)

## 2021-01-01 LAB — INSULIN, RANDOM: INSULIN: 31.2 u[IU]/mL — ABNORMAL HIGH (ref 2.6–24.9)

## 2021-01-01 LAB — VITAMIN D 25 HYDROXY (VIT D DEFICIENCY, FRACTURES): Vit D, 25-Hydroxy: 34.6 ng/mL (ref 30.0–100.0)

## 2021-01-01 LAB — COMPREHENSIVE METABOLIC PANEL
ALT: 46 IU/L — ABNORMAL HIGH (ref 0–44)
AST: 33 IU/L (ref 0–40)
Albumin/Globulin Ratio: 1.6 (ref 1.2–2.2)
Albumin: 4.7 g/dL (ref 3.8–4.9)
Alkaline Phosphatase: 107 IU/L (ref 44–121)
BUN/Creatinine Ratio: 9 (ref 9–20)
BUN: 8 mg/dL (ref 6–24)
Bilirubin Total: 0.6 mg/dL (ref 0.0–1.2)
CO2: 25 mmol/L (ref 20–29)
Calcium: 9.8 mg/dL (ref 8.7–10.2)
Chloride: 102 mmol/L (ref 96–106)
Creatinine, Ser: 0.85 mg/dL (ref 0.76–1.27)
GFR calc Af Amer: 115 mL/min/{1.73_m2} (ref >59)
GFR calc non Af Amer: 99 mL/min/{1.73_m2} (ref >59)
Globulin, Total: 2.9 g/dL (ref 1.5–4.5)
Glucose: 92 mg/dL (ref 65–99)
Potassium: 4.8 mmol/L (ref 3.5–5.2)
Sodium: 140 mmol/L (ref 134–144)
Total Protein: 7.6 g/dL (ref 6.0–8.5)

## 2021-01-01 LAB — VITAMIN B12: Vitamin B-12: 907 pg/mL (ref 232–1245)

## 2021-01-01 LAB — HEMOGLOBIN A1C
Est. average glucose Bld gHb Est-mCnc: 131 mg/dL
Hgb A1c MFr Bld: 6.2 % — ABNORMAL HIGH (ref 4.8–5.6)

## 2021-01-01 LAB — T3: T3, Total: 147 ng/dL (ref 71–180)

## 2021-01-01 LAB — TSH: TSH: 1.03 u[IU]/mL (ref 0.450–4.500)

## 2021-01-01 LAB — T4: T4, Total: 8.7 ug/dL (ref 4.5–12.0)

## 2021-01-02 NOTE — Progress Notes (Signed)
Dear Dr. Phill Myron,   Thank you for referring Darrell Conway to our clinic. The following note includes my evaluation and treatment recommendations.  Chief Complaint:   OBESITY Elijiah Mickley (MR# [id number]) is a 55 y.o. male who presents for evaluation and treatment of obesity and related comorbidities. Current BMI is Body mass index is 40.45 kg/m. Fuller has been struggling with his weight for many years and has been unsuccessful in either losing weight, maintaining weight loss, or reaching his healthy weight goal.   Poseidon is currently in the action stage of change and ready to dedicate time achieving and maintaining a healthier weight. Jameer is interested in becoming our patient and working on intensive lifestyle modifications including (but not limited to) diet and exercise for weight loss.  Traye is not sure how he heard about the clinic. He is a Clinical biochemist- decks and remodels. His weight has been up and down since age 25. Wake up- meet at restaurant- eggs, starch (toast or toast and grits) and coffee. May eat lunch- burger or chicken- [employer] burger, fries (large)- eat all and feel full. Dinner- protein, starch, veggie and bread- varying size of protein, 1/2-1 cup of veggies, 2 cups of starch.  Wilfrid's habits were reviewed today and are as follows: His family eats meals together, he thinks his family will eat healthier with him, his desired weight loss is 90 lbs, he started gaining weight at age 10, his heaviest weight ever was 290 pounds, he has significant food cravings issues, he snacks frequently in the evenings, he wakes up frequently in the middle of the night to eat, he is frequently drinking liquids with calories, he frequently makes poor food choices, he has problems with excessive hunger, he frequently eats larger portions than normal, he has binge eating behaviors and he struggles with emotional eating.  Depression Screen Nasri's Food and Mood (modified  PHQ-9) score was 13.  Depression screen [facility] 2/9 12/31/2020  Decreased Interest 2  Down, Depressed, Hopeless 1  PHQ - 2 Score 3  Altered sleeping 3  Tired, decreased energy 3  Change in appetite 2  Feeling bad or failure about yourself  2  Trouble concentrating 0  Moving slowly or fidgety/restless 0  Suicidal thoughts 0  PHQ-9 Score 13  Difficult doing work/chores Not difficult at all   Subjective:   1. Other fatigue Christoper admits to daytime somnolence and admits to waking up still tired. Patent has a history of symptoms of daytime fatigue. Lendon generally gets 6 hours of sleep per night, and states that he has poor sleep quality. Snoring is present. Apneic episodes are present. Epworth Sleepiness Score is 13. EKG normal sinus rhythm at 71 bpm with T-wave abnormality II, III, aVF.  2. SOB (shortness of breath) on exertion Zenia Resides notes increasing shortness of breath with exercising and seems to be worsening over time with weight gain. He notes getting out of breath sooner with activity than he used to. This has gotten worse recently. Christopherjame denies shortness of breath at rest or orthopnea.  EKG normal sinus rhythm at 71 bpm with T-wave abnormality II, III, aVF.  3. Essential hypertension Jadriel was diagnosed with hypertension about 10 years ago. He is on Lisinopril and amlodipine. No other prior medications. His BP is well controlled today.  4. Hyperlipidemia, unspecified hyperlipidemia type Mavin is on atorvastatin 40 mg. He was diagnosed about 2 years ago but started meds a few months ago. He denies myalgias or transaminitis previously.  5. Osteoarthritis of left hip, unspecified osteoarthritis type Jayce is in need of left sided hip replacement and right knee replacement.   6. Obstructive sleep apnea of adult Bookert has a CPAP at home but wears it inconsistently. His Epworth score is 13.  7. Pre-diabetes Dohn's last A1c was 5.9 in 03/2019. He is not on medication.  8. At risk of  diabetes mellitus Tymothy is at higher than average risk for developing diabetes due to obesity.   Assessment/Plan:   1. Other fatigue Jaedyn does feel that his weight is causing his energy to be lower than it should be. Fatigue may be related to obesity, depression or many other causes. Labs will be ordered, and in the meanwhile, Garrett will focus on self care including making healthy food choices, increasing physical activity and focusing on stress reduction.  - EKG 12-Lead - Vitamin B12 - T3 - T4 - TSH - VITAMIN D 25 Hydroxy (Vit-D Deficiency, Fractures)  2. SOB (shortness of breath) on exertion Garison does feel that he gets out of breath more easily that he used to when he exercises. Jeffre's shortness of breath appears to be obesity related and exercise induced. He has agreed to work on weight loss and gradually increase exercise to treat his exercise induced shortness of breath. Will continue to monitor closely.  3. Essential hypertension Adalbert is working on healthy weight loss and exercise to improve blood pressure control. We will watch for signs of hypotension as he continues his lifestyle modifications. Check CMP and FLP today.  - Comprehensive metabolic panel  4. Hyperlipidemia, unspecified hyperlipidemia type Cardiovascular risk and specific lipid/LDL goals reviewed.  We discussed several lifestyle modifications today and Sears will continue to work on diet, exercise and weight loss efforts. Orders and follow up as documented in patient record. Check FLP today.  Counseling Intensive lifestyle modifications are the first line treatment for this issue. . Dietary changes: Increase soluble fiber. Decrease simple carbohydrates. . Exercise changes: Moderate to vigorous-intensity aerobic activity 150 minutes per week if tolerated. . Lipid-lowering medications: see documented in medical record.  - Lipid Panel With LDL/HDL Ratio  5. Osteoarthritis of left hip, unspecified  osteoarthritis type Follow up with Dr. Alvan Dame.  6. Obstructive sleep apnea of adult Intensive lifestyle modifications are the first line treatment for this issue. We discussed several lifestyle modifications today and he will continue to work on diet, exercise and weight loss efforts. We will continue to monitor. Orders and follow up as documented in patient record. Follow up at next appointment.  7. Pre-diabetes Stepen will continue to work on weight loss, exercise, and decreasing simple carbohydrates to help decrease the risk of diabetes. Check labs today.  - Hemoglobin A1c - Insulin, random  8. Depression screening Shyler had a positive depression screening. Depression is commonly associated with obesity and often results in emotional eating behaviors. We will monitor this closely and work on CBT to help improve the non-hunger eating patterns. Referral to Psychology may be required if no improvement is seen as he continues in our clinic.  9. At risk of diabetes mellitus Anquan was given approximately 15 minutes of diabetes education and counseling today. We discussed intensive lifestyle modifications today with an emphasis on weight loss as well as increasing exercise and decreasing simple carbohydrates in his diet. We also reviewed medication options with an emphasis on risk versus benefit of those discussed.   Repetitive spaced learning was employed today to elicit superior memory formation and behavioral change.  10.  Class 3 severe obesity with serious comorbidity and body mass index (BMI) of 40.0 to 44.9 in adult, unspecified obesity type (HCC) Tarrance is currently in the action stage of change and his goal is to continue with weight loss efforts. I recommend Orlin begin the structured treatment plan as follows:  He has agreed to the Category 3 Plan.  Exercise goals: No exercise has been prescribed at this time.   Behavioral modification strategies: increasing lean protein intake, meal  planning and cooking strategies, keeping healthy foods in the home and planning for success.  He was informed of the importance of frequent follow-up visits to maximize his success with intensive lifestyle modifications for his multiple health conditions. He was informed we would discuss his lab results at his next visit unless there is a critical issue that needs to be addressed sooner. Quinnten agreed to keep his next visit at the agreed upon time to discuss these results.  Objective:   Blood pressure 136/85, pulse 75, temperature 98.1 F (36.7 C), temperature source Oral, height 5\' 11"  (1.803 m), weight 290 lb (131.5 kg), SpO2 98 %. Body mass index is 40.45 kg/m.  EKG: Normal sinus rhythm, rate 71.  Indirect Calorimeter completed today shows a VO2 of 299 and a REE of 2085.  His calculated basal metabolic rate is 8119 thus his basal metabolic rate is worse than expected.  General: Cooperative, alert, well developed, in no acute distress. HEENT: Conjunctivae and lids unremarkable. Cardiovascular: Regular rhythm.  Lungs: Normal work of breathing. Neurologic: No focal deficits.   Lab Results  Component Value Date   CREATININE 0.85 12/31/2020   BUN 8 12/31/2020   NA 140 12/31/2020   K 4.8 12/31/2020   CL 102 12/31/2020   CO2 25 12/31/2020   Lab Results  Component Value Date   ALT 46 (H) 12/31/2020   AST 33 12/31/2020   ALKPHOS 107 12/31/2020   BILITOT 0.6 12/31/2020   Lab Results  Component Value Date   HGBA1C 6.2 (H) 12/31/2020   HGBA1C 5.9 (H) 03/28/2019   Lab Results  Component Value Date   INSULIN 31.2 (H) 12/31/2020   Lab Results  Component Value Date   TSH 1.030 12/31/2020   Lab Results  Component Value Date   CHOL 151 12/31/2020   HDL 40 12/31/2020   LDLCALC 97 12/31/2020   LDLDIRECT 193 (H) 10/02/2020   TRIG 70 12/31/2020   CHOLHDL 3.8 05/31/2019   Lab Results  Component Value Date   WBC 8.5 10/02/2020   HGB 15.2 10/02/2020   HCT 45.7 10/02/2020   MCV  85 10/02/2020   PLT 285 10/02/2020   Attestation Statements:   Reviewed by clinician on day of visit: allergies, medications, problem list, medical history, surgical history, family history, social history, and previous encounter notes.  Coral Ceo, am acting as transcriptionist for Coralie Common, MD.  This is the patient's first visit at [facility]. The patient's NEW PATIENT PACKET was reviewed at length. Included in the packet: current and past health history, medications, allergies, ROS, gynecologic history (women only), surgical history, family history, social history, weight history, weight loss surgery history (for those that have had weight loss surgery), nutritional evaluation, mood and food questionnaire, PHQ9, Epworth questionnaire, sleep habits questionnaire, patient life and health improvement goals questionnaire. These will all be scanned into the patient's chart under media.   During the visit, I independently reviewed the patient's EKG, bioimpedance scale results, and indirect calorimeter results. I used this  information to tailor a meal plan for the patient that will help him to lose weight and will improve his obesity-related conditions going forward. I performed a medically necessary appropriate examination and/or evaluation. I discussed the assessment and treatment plan with the patient. The patient was provided an opportunity to ask questions and all were answered. The patient agreed with the plan and demonstrated an understanding of the instructions. Labs were ordered at this visit and will be reviewed at the next visit unless more critical results need to be addressed immediately. Clinical information was updated and documented in the EMR.   Time spent on visit including pre-visit chart review and post-visit care was 45 minutes.   A separate 15 minutes was spent on risk counseling (see above).   I have reviewed the above documentation for accuracy  and completeness, and I agree with the above. - Jinny Blossom, MD

## 2021-01-03 ENCOUNTER — Ambulatory Visit (INDEPENDENT_AMBULATORY_CARE_PROVIDER_SITE_OTHER): Payer: 59 | Admitting: Internal Medicine

## 2021-01-03 ENCOUNTER — Encounter: Payer: Self-pay | Admitting: Internal Medicine

## 2021-01-03 ENCOUNTER — Other Ambulatory Visit: Payer: Self-pay

## 2021-01-03 VITALS — BP 139/91 | HR 78 | Temp 98.1°F | Resp 18 | Ht 72.0 in | Wt 294.0 lb

## 2021-01-03 DIAGNOSIS — K047 Periapical abscess without sinus: Secondary | ICD-10-CM

## 2021-01-03 DIAGNOSIS — I10 Essential (primary) hypertension: Secondary | ICD-10-CM | POA: Diagnosis not present

## 2021-01-03 DIAGNOSIS — G4733 Obstructive sleep apnea (adult) (pediatric): Secondary | ICD-10-CM | POA: Diagnosis not present

## 2021-01-03 MED ORDER — AMLODIPINE BESYLATE 10 MG PO TABS: 10.0000 mg | ORAL_TABLET | Freq: Every day | ORAL | 1 refills | Status: DC

## 2021-01-03 MED ORDER — LISINOPRIL 20 MG PO TABS: 20.0000 mg | ORAL_TABLET | Freq: Every day | ORAL | 1 refills | Status: DC

## 2021-01-03 MED ORDER — AMOXICILLIN-POT CLAVULANATE 875-125 MG PO TABS: 1.0000 | ORAL_TABLET | Freq: Two times a day (BID) | ORAL | 0 refills | Status: DC

## 2021-01-03 NOTE — Progress Notes (Signed)
Subjective:    Darrell Conway - 55 y.o. male MRN [id number]  Date of birth: 04-May-1966  HPI  Darrell Conway is here for follow up of chronic medical conditions. Has been seen at the healthy weight and wellness clinic this week. Reports visit went well and he is feeling motivated for weight loss. Labs were drawn at this visit.   Chronic HTN Disease Monitoring:  Home BP Monitoring - Wife monitors. He is unsure of readings but reports she has not had any concerns.  Chest pain- no  Dyspnea- no Headache - no  Medications: Lisinopril 20 mg, Amlodipine 10 mg  Compliance- yes Lightheadedness- no  Edema- no     Health Maintenance:  Health Maintenance Due  Topic Date Due  . COVID-19 Vaccine (1) Never done    -  reports that he quit smoking about 7 years ago. His smoking use included cigarettes. He smoked 0.75 packs per day. He has never used smokeless tobacco. - Review of Systems: Per HPI. - Past Medical History: Patient Active Problem List   Diagnosis Date Noted  . Irregular heart rhythm 02/14/2020  . History of COVID-19 02/13/2020  . Memory loss 02/13/2020  . Poor concentration 02/13/2020  . Muscle pain 02/13/2020  . Unilateral primary osteoarthritis, left hip 01/31/2020  . Morbid (severe) obesity due to excess calories ([place]) 06/29/2019  . Bilateral primary osteoarthritis of knee 05/05/2019  . Primary osteoarthritis of both hips   . Arthralgia of multiple joints 04/26/2019  . Pain of both hip joints 04/26/2019  . Obstructive sleep apnea of adult 04/11/2019  . Laryngopharyngeal reflux (LPR) 04/11/2019   - Medications: reviewed and updated   Objective:   Physical Exam BP (!) 139/91 (BP Location: Right Arm, Patient Position: Sitting, Cuff Size: Large)   Pulse 78   Temp 98.1 F (36.7 C) (Oral)   Resp 18   Ht 6' (1.829 m)   Wt 294 lb (133.4 kg)   SpO2 93%   BMI 39.87 kg/m  Physical Exam Constitutional:      General: He is not in acute distress.     Appearance: He is not diaphoretic.  HENT:     Head: Normocephalic and atraumatic.  Eyes:     Extraocular Movements: EOM normal.     Conjunctiva/sclera: Conjunctivae normal.  Cardiovascular:     Rate and Rhythm: Normal rate and regular rhythm.     Heart sounds: Normal heart sounds. No murmur heard.   Pulmonary:     Effort: Pulmonary effort is normal. No respiratory distress.     Breath sounds: Normal breath sounds.  Musculoskeletal:        General: Normal range of motion.  Skin:    General: Skin is warm and dry.  Neurological:     Mental Status: He is alert and oriented to person, place, and time.  Psychiatric:        Mood and Affect: Affect normal.        Judgment: Judgment normal.            Assessment & Plan:   1. Essential hypertension BP is slightly above goal; however, patient has not taken medications this AM prior to visit. Continue to monitor at home. Asymptomatic. Continue current regimen given close to goal.  Counseled on blood pressure goal of less than 130/80, low-sodium, DASH diet, medication compliance, 150 minutes of moderate intensity exercise per week. Discussed medication compliance, adverse effects. - amLODipine (NORVASC) 10 MG tablet; Take 1 tablet (10 mg total) by mouth daily.  Dispense: 90 tablet; Refill: 1 - lisinopril (ZESTRIL) 20 MG tablet; Take 1 tablet (20 mg total) by mouth daily.  Dispense: 90 tablet; Refill: 1  2. Dental infection - amoxicillin-clavulanate (AUGMENTIN) 875-125 MG tablet; Take 1 tablet by mouth 2 (two) times daily.  Dispense: 20 tablet; Refill: 0  3. Obstructive sleep apnea of adult Patient reports new adherence with CPAP after visit earlier this week with [facility] weight [facility] wellness clinic. Discussed benefits of CPAP use.     Phill Myron, D.O. 01/03/2021, 9:28 AM Primary Care at [facility]

## 2021-01-03 NOTE — Progress Notes (Signed)
Requesting ABT for tooth, filling came out

## 2021-01-07 NOTE — H&P (Signed)
TOTAL HIP ADMISSION H&P  Patient is admitted for left total hip arthroplasty, anterior approach.  Subjective:  Chief Complaint:  Left hip OA / pain  HPI: Darrell Conway, 55 y.o. male, has a history of pain and functional disability in the left hip(s) due to arthritis and patient has failed non-surgical conservative treatments for greater than 12 weeks to include NSAID's and/or analgesics, corticosteriod injections and activity modification.  Onset of symptoms was gradual starting 1-2 years ago with gradually worsening course since that time.The patient noted no past surgery on the left hip(s).  Patient currently rates pain in the left hip at 10 out of 10 with activity. Patient has night pain, worsening of pain with activity and weight bearing, pain that interfers with activities of daily living and pain with passive range of motion. Patient has evidence of periarticular osteophytes and joint space narrowing by imaging studies. This condition presents safety issues increasing the risk of falls.  There is no current active infection.  Risks, benefits and expectations were discussed with the patient.  Risks including but not limited to the risk of anesthesia, blood clots, nerve damage, blood vessel damage, failure of the prosthesis, infection and up to and including death.  Patient understand the risks, benefits and expectations and wishes to proceed with surgery.   D/C Plans:       Home  Post-op Meds:       No Rx given   Tranexamic Acid:      To be given - IV   Decadron:      Is to be given  FYI:      ASA  Oxycodone (on Oxy 10 mg 2-3 times a day prior to surgery)  DME:  Rx sent for - RW & 3-N-1  PT:   HEP  Pharmacy: [facility] [place]    Patient Active Problem List   Diagnosis Date Noted  . Essential hypertension 01/03/2021  . Irregular heart rhythm 02/14/2020  . History of COVID-19 02/13/2020  . Memory loss 02/13/2020  . Poor concentration 02/13/2020  . Muscle pain 02/13/2020   . Unilateral primary osteoarthritis, left hip 01/31/2020  . Morbid (severe) obesity due to excess calories ([place]) 06/29/2019  . Bilateral primary osteoarthritis of knee 05/05/2019  . Primary osteoarthritis of both hips   . Arthralgia of multiple joints 04/26/2019  . Pain of both hip joints 04/26/2019  . Obstructive sleep apnea of adult 04/11/2019  . Laryngopharyngeal reflux (LPR) 04/11/2019   Past Medical History:  Diagnosis Date  . Anemia   . Back pain   . GERD (gastroesophageal reflux disease)   . Gout   . Heartburn   . High cholesterol   . Hip pain   . Hypertension   . Knee pain   . Rheumatoid arthritis ([place])   . Sleep apnea   . Swallowing difficulty     Past Surgical History:  Procedure Laterality Date  . HERNIA REPAIR      No current facility-administered medications for this encounter.   Current Outpatient Medications  Medication Sig Dispense Refill Last Dose  . amLODipine (NORVASC) 10 MG tablet Take 1 tablet (10 mg total) by mouth daily. 90 tablet 1   . amoxicillin-clavulanate (AUGMENTIN) 875-125 MG tablet Take 1 tablet by mouth 2 (two) times daily. 20 tablet 0   . atorvastatin (LIPITOR) 20 MG tablet Take 1 tablet (20 mg total) by mouth daily. 90 tablet 3   . lisinopril (ZESTRIL) 20 MG tablet Take 1 tablet (20 mg  total) by mouth daily. 90 tablet 1   . meloxicam (MOBIC) 15 MG tablet Take 15 mg by mouth daily.     . Multiple Vitamin (MULTIVITAMIN) capsule Take 1 capsule by mouth daily.     . Oxycodone HCl 10 MG TABS Take 10 mg by mouth 3 (three) times daily as needed.      No Known Allergies   Social History   Tobacco Use  . Smoking status: Former Smoker    Packs/day: 0.75    Types: Cigarettes    Quit date: 2015    Years since quitting: 7.1  . Smokeless tobacco: Never Used  Substance Use Topics  . Alcohol use: Not Currently    Family History  Problem Relation Age of Onset  . Rheum arthritis Mother   . Hypertension Mother   . Rheum arthritis Sister       Review of Systems  Constitutional: Negative.   HENT: Negative.   Eyes: Negative.   Respiratory: Negative.   Cardiovascular: Negative.   Gastrointestinal: Negative.   Genitourinary: Negative.   Musculoskeletal: Positive for joint pain.  Skin: Negative.   Neurological: Negative.   Endo/Heme/Allergies: Negative.   Psychiatric/Behavioral: Negative.      Objective:  Physical Exam Constitutional:      Appearance: He is well-developed.  HENT:     Head: Normocephalic.  Eyes:     Pupils: Pupils are equal, round, and reactive to light.  Neck:     Thyroid: No thyromegaly.     Vascular: No JVD.     Trachea: No tracheal deviation.  Cardiovascular:     Rate and Rhythm: Normal rate and regular rhythm.     Pulses: Intact distal pulses.  Pulmonary:     Effort: Pulmonary effort is normal. No respiratory distress.     Breath sounds: Normal breath sounds. No wheezing.  Abdominal:     Palpations: Abdomen is soft.     Tenderness: There is no abdominal tenderness. There is no guarding.  Musculoskeletal:     Cervical back: Neck supple.     Left hip: Tenderness and bony tenderness present. Decreased range of motion. Decreased strength.  Lymphadenopathy:     Cervical: No cervical adenopathy.  Skin:    General: Skin is warm and dry.  Neurological:     Mental Status: He is alert and oriented to person, place, and time.  Psychiatric:        Mood and Affect: Mood and affect normal.       Labs:  Estimated body mass index is 39.87 kg/m as calculated from the following:   Height as of 01/03/21: 6' (1.829 m).   Weight as of 01/03/21: 133.4 kg.   Imaging Review Plain radiographs demonstrate severe degenerative joint disease of the left hip. The bone quality appears to be good for age and reported activity level.      Assessment/Plan:  End stage arthritis, left hip  The patient history, physical examination, clinical judgement of the provider and imaging studies are consistent  with end stage degenerative joint disease of the left hip and total hip arthroplasty is deemed medically necessary. The treatment options including medical management, injection therapy, arthroscopy and arthroplasty were discussed at length. The risks and benefits of total hip arthroplasty were presented and reviewed. The risks due to aseptic loosening, infection, stiffness, dislocation/subluxation,  thromboembolic complications and other imponderables were discussed.  The patient acknowledged the explanation, agreed to proceed with the plan and consent was signed. Patient is being admitted for treatment  for surgery, pain control, PT, OT, prophylactic antibiotics, VTE prophylaxis, progressive ambulation and ADL's and discharge planning.The patient is planning to be discharged home.   West Pugh Sequan Auxier   PA-C  01/07/2021, 11:19 AM

## 2021-01-14 ENCOUNTER — Ambulatory Visit (INDEPENDENT_AMBULATORY_CARE_PROVIDER_SITE_OTHER): Payer: 59 | Admitting: Family Medicine

## 2021-01-14 NOTE — Progress Notes (Signed)
DUE TO COVID-19 ONLY ONE VISITOR IS ALLOWED TO COME WITH YOU AND STAY IN THE WAITING ROOM ONLY DURING PRE OP AND PROCEDURE DAY OF SURGERY. THE 1 VISITOR  MAY VISIT WITH YOU AFTER SURGERY IN YOUR PRIVATE ROOM DURING VISITING HOURS ONLY!  YOU NEED TO HAVE A COVID 19 TEST ON__2/25/2022 _____ @_______ , THIS TEST MUST BE DONE BEFORE SURGERY,  COVID TESTING SITE [address] [place] [address], IT IS ON THE RIGHT GOING OUT [address] APPROXIMATELY  2 MINUTES PAST [employer] ON THE RIGHT. ONCE YOUR COVID TEST IS COMPLETED,  PLEASE BEGIN THE QUARANTINE INSTRUCTIONS AS OUTLINED IN YOUR HANDOUT.                Darrell Conway  01/14/2021   Your procedure is scheduled on: 01/22/2021    Report to [facility] Main  Entrance   Report to admitting at 0900    AM     Call this number if you have problems the morning of surgery [phone]    REMEMBER: NO  SOLID FOOD CANDY OR GUM AFTER MIDNIGHT. CLEAR LIQUIDS UNTIL 0830am         . NOTHING BY MOUTH EXCEPT CLEAR LIQUIDS UNTIL    . PLEASE FINISH ENSURE DRINK PER SURGEON ORDER  WHICH NEEDS TO BE COMPLETED AT   0830am  .      CLEAR LIQUID DIET   Foods Allowed                                                                    Coffee and tea, regular and decaf                            Fruit ices (not with fruit pulp)                                      Iced Popsicles                                    Carbonated beverages, regular and diet                                    Cranberry, grape and apple juices Sports drinks like Gatorade Lightly seasoned clear broth or consume(fat free) Sugar, honey syrup ___________________________________________________________________      BRUSH YOUR TEETH MORNING OF SURGERY AND RINSE YOUR MOUTH OUT, NO CHEWING GUM CANDY OR MINTS.     Take these medicines the morning of surgery with A SIP OF WATER: protonix   DO NOT TAKE ANY DIABETIC MEDICATIONS DAY OF YOUR SURGERY                                You may not have any metal on your body including hair pins and              piercings  Do not wear jewelry, make-up,  lotions, powders or perfumes, deodorant             Do not wear nail polish on your fingernails.  Do not shave  48 hours prior to surgery.              Men may shave face and neck.   Do not bring valuables to the hospital. [place].  Contacts, dentures or bridgework may not be worn into surgery.  Leave suitcase in the car. After surgery it may be brought to your room.     Patients discharged the day of surgery will not be allowed to drive home. IF YOU ARE HAVING SURGERY AND GOING HOME THE SAME DAY, YOU MUST HAVE AN ADULT TO DRIVE YOU HOME AND BE WITH YOU FOR 24 HOURS. YOU MAY GO HOME BY TAXI OR UBER OR ORTHERWISE, BUT AN ADULT MUST ACCOMPANY YOU HOME AND STAY WITH YOU FOR 24 HOURS.  Name and phone number of your driver:  Special Instructions: N/A              Please read over the following fact sheets you were given: _____________________________________________________________________  [facility] - Preparing for Surgery Before surgery, you can play an important role.  Because skin is not sterile, your skin needs to be as free of germs as possible.  You can reduce the number of germs on your skin by washing with CHG (chlorahexidine gluconate) soap before surgery.  CHG is an antiseptic cleaner which kills germs and bonds with the skin to continue killing germs even after washing. Please DO NOT use if you have an allergy to CHG or antibacterial soaps.  If your skin becomes reddened/irritated stop using the CHG and inform your nurse when you arrive at Short Stay. Do not shave (including legs and underarms) for at least 48 hours prior to the first CHG shower.  You may shave your face/neck. Please follow these instructions carefully:  1.  Shower with CHG Soap the night before surgery and the  morning of  Surgery.  2.  If you choose to wash your hair, wash your hair first as usual with your  normal  shampoo.  3.  After you shampoo, rinse your hair and body thoroughly to remove the  shampoo.                           4.  Use CHG as you would any other liquid soap.  You can apply chg directly  to the skin and wash                       Gently with a scrungie or clean washcloth.  5.  Apply the CHG Soap to your body ONLY FROM THE NECK DOWN.   Do not use on face/ open                           Wound or open sores. Avoid contact with eyes, ears mouth and genitals (private parts).                       Wash face,  Genitals (private parts) with your normal soap.             6.  Wash thoroughly, paying special attention to  the area where your surgery  will be performed.  7.  Thoroughly rinse your body with warm water from the neck down.  8.  DO NOT shower/wash with your normal soap after using and rinsing off  the CHG Soap.                9.  Pat yourself dry with a clean towel.            10.  Wear clean pajamas.            11.  Place clean sheets on your bed the night of your first shower and do not  sleep with pets. Day of Surgery : Do not apply any lotions/deodorants the morning of surgery.  Please wear clean clothes to the hospital/surgery center.  FAILURE TO FOLLOW THESE INSTRUCTIONS MAY RESULT IN THE CANCELLATION OF YOUR SURGERY PATIENT SIGNATURE_________________________________  NURSE SIGNATURE__________________________________  ________________________________________________________________________

## 2021-01-18 ENCOUNTER — Encounter (HOSPITAL_COMMUNITY)
Admission: RE | Admit: 2021-01-18 | Discharge: 2021-01-18 | Disposition: A | Discharge status: Home / Self Care | Payer: 59 | Source: Ambulatory Visit | Attending: Orthopedic Surgery | Admitting: Orthopedic Surgery

## 2021-01-18 ENCOUNTER — Other Ambulatory Visit: Payer: Self-pay

## 2021-01-18 ENCOUNTER — Encounter (HOSPITAL_COMMUNITY): Payer: Self-pay

## 2021-01-18 ENCOUNTER — Other Ambulatory Visit (HOSPITAL_COMMUNITY)
Admission: RE | Admit: 2021-01-18 | Discharge: 2021-01-18 | Disposition: A | Discharge status: Home / Self Care | Payer: 59 | Source: Ambulatory Visit | Attending: Orthopedic Surgery | Admitting: Orthopedic Surgery

## 2021-01-18 DIAGNOSIS — Z01812 Encounter for preprocedural laboratory examination: Secondary | ICD-10-CM | POA: Diagnosis present

## 2021-01-18 DIAGNOSIS — Z20822 Contact with and (suspected) exposure to covid-19: Secondary | ICD-10-CM | POA: Diagnosis not present

## 2021-01-18 LAB — CBC
HCT: 41.9 % (ref 39.0–52.0)
Hemoglobin: 14.5 g/dL (ref 13.0–17.0)
MCH: 29.5 pg (ref 26.0–34.0)
MCHC: 34.6 g/dL (ref 30.0–36.0)
MCV: 85.3 fL (ref 80.0–100.0)
Platelets: 263 10*3/uL (ref 150–400)
RBC: 4.91 MIL/uL (ref 4.22–5.81)
RDW: 12.9 % (ref 11.5–15.5)
WBC: 5.5 10*3/uL (ref 4.0–10.5)
nRBC: 0 % (ref 0.0–0.2)

## 2021-01-18 LAB — SARS CORONAVIRUS 2 (TAT 6-24 HRS): SARS Coronavirus 2: NEGATIVE

## 2021-01-18 LAB — BASIC METABOLIC PANEL
Anion gap: 8 (ref 5–15)
BUN: 9 mg/dL (ref 6–20)
CO2: 25 mmol/L (ref 22–32)
Calcium: 9.2 mg/dL (ref 8.9–10.3)
Chloride: 104 mmol/L (ref 98–111)
Creatinine, Ser: 0.72 mg/dL (ref 0.61–1.24)
GFR, Estimated: >60 mL/min (ref >60)
Glucose, Bld: 96 mg/dL (ref 70–99)
Potassium: 4.2 mmol/L (ref 3.5–5.1)
Sodium: 137 mmol/L (ref 135–145)

## 2021-01-18 LAB — SURGICAL PCR SCREEN
MRSA, PCR: NEGATIVE
Staphylococcus aureus: NEGATIVE

## 2021-01-18 NOTE — Progress Notes (Signed)
Anesthesia Review:  PCP:  01/03/21- LOV- PCP-  Dr Phill Myron- epic  Cardiologist : Chest x-ray : EKG : 12/31/20 - epic  Echo : Stress test: Cardiac Cath :  Activity level: can do a flight of stairs without difficulty  Sleep Study/ CPAP : yes pt to bring mask and tubing  Fasting Blood Sugar :      / Checks Blood Sugar -- times a day:   Blood Thinner/ Instructions /Last Dose: ASA / Instructions/ Last Dose :

## 2021-01-21 MED ORDER — DEXTROSE 5 % IV SOLN
3.0000 g | INTRAVENOUS | Status: DC
  Filled 2021-01-21: qty 3000

## 2021-01-22 ENCOUNTER — Encounter (HOSPITAL_COMMUNITY): Admission: RE | Payer: Self-pay | Source: Home / Self Care

## 2021-01-22 ENCOUNTER — Ambulatory Visit (HOSPITAL_COMMUNITY): Admission: RE | Admit: 2021-01-22 | Payer: 59 | Source: Home / Self Care | Admitting: Orthopedic Surgery

## 2021-01-22 LAB — TYPE AND SCREEN
ABO/RH(D): O POS
Antibody Screen: NEGATIVE

## 2021-01-22 SURGERY — ARTHROPLASTY, HIP, TOTAL, ANTERIOR APPROACH
Anesthesia: Spinal | Site: Hip | Laterality: Left

## 2021-01-23 ENCOUNTER — Ambulatory Visit: Payer: 59 | Admitting: Internal Medicine

## 2021-02-14 ENCOUNTER — Telehealth (INDEPENDENT_AMBULATORY_CARE_PROVIDER_SITE_OTHER): Payer: 59 | Admitting: Nurse Practitioner

## 2021-02-14 ENCOUNTER — Ambulatory Visit: Payer: 59 | Admitting: Internal Medicine

## 2021-02-14 DIAGNOSIS — M170 Bilateral primary osteoarthritis of knee: Secondary | ICD-10-CM | POA: Diagnosis not present

## 2021-02-14 DIAGNOSIS — M25552 Pain in left hip: Secondary | ICD-10-CM | POA: Diagnosis not present

## 2021-02-14 DIAGNOSIS — I10 Essential (primary) hypertension: Secondary | ICD-10-CM | POA: Diagnosis not present

## 2021-02-14 DIAGNOSIS — M25551 Pain in right hip: Secondary | ICD-10-CM

## 2021-02-14 MED ORDER — LISINOPRIL 20 MG PO TABS: 20.0000 mg | ORAL_TABLET | Freq: Every day | ORAL | 1 refills | Status: DC

## 2021-02-14 MED ORDER — ATORVASTATIN CALCIUM 20 MG PO TABS: 20.0000 mg | ORAL_TABLET | Freq: Every day | ORAL | 3 refills | Status: DC

## 2021-02-14 MED ORDER — AMLODIPINE BESYLATE 10 MG PO TABS: 10.0000 mg | ORAL_TABLET | Freq: Every day | ORAL | 1 refills | Status: DC

## 2021-02-14 MED ORDER — PANTOPRAZOLE SODIUM 40 MG PO TBEC: 40.0000 mg | DELAYED_RELEASE_TABLET | Freq: Two times a day (BID) | ORAL | 0 refills | Status: DC

## 2021-02-14 NOTE — Patient Instructions (Addendum)
Essential hypertension:  BP is slightly above goal; this could be due to chronic pain, Continue to monitor at home. Counseled on blood pressure goal of less than 130/80, low-sodium, DASH diet, medication compliance, 150 minutes of moderate intensity exercise per week. Discussed medication compliance, adverse effects. - amLODipine (NORVASC) 10 MG tablet; Take 1 tablet (10 mg total) by mouth daily.  Dispense: 90 tablet; Refill: 1 - lisinopril (ZESTRIL) 20 MG tablet; Take 1 tablet (20 mg total) by mouth daily.  Dispense: 90 tablet; Refill: 1  Chronic Pain:  Surgery has been cancelled Will place referral for pain management   Obstructive sleep apnea of adult:  Patient reports adherence with CPAP   Follow up:  Follow up with Dr. Juleen China

## 2021-02-14 NOTE — Progress Notes (Signed)
Virtual Visit via Telephone Note  I connected with Darrell Conway on 02/14/21 at  3:00 PM EDT by telephone and verified that I am speaking with the correct person using two identifiers.  Location: Patient: home Provider: office   I discussed the limitations, risks, security and privacy concerns of performing an evaluation and management service by telephone and the availability of in person appointments. I also discussed with the patient that there may be a patient responsible charge related to this service. The patient expressed understanding and agreed to proceed.   History of Present Illness:  Patient presents today for follow-up visit.  He was last seen by Dr. Juleen China on 01/03/2019.  Patient states that he has been compliant with the blood pressure medications.  He reports that his blood pressure has been elevated recently due to pain issues.  He was scheduled to have hip replacement but his surgery was canceled due to insurance issues.  Hopefully he will be able to get that rescheduled soon.  We discussed that he does still need to follow a low-sodium diet.  We will refer him for pain management and hopefully his blood pressure will return to normal with his pain under control.  We will schedule a close follow-up with Dr. Juleen China.  Patient states that he is compliant with CPAP. Denies f/c/s, n/v/d, hemoptysis, PND, chest pain or edema.     Observations/Objective:  Vitals with BMI 01/18/2021 01/03/2021 12/31/2020  Height - 6\' 0"  5\' 11"   Weight - 294 lbs 290 lbs  BMI - 58.85 02.77  Systolic [phone]  Diastolic 94 91 85  Pulse 80 78 75      Assessment and Plan:  Essential hypertension:  BP is slightly above goal; this could be due to chronic pain, Continue to monitor at home. Counseled on blood pressure goal of less than 130/80, low-sodium, DASH diet, medication compliance, 150 minutes of moderate intensity exercise per week. Discussed medication compliance, adverse effects. -  amLODipine (NORVASC) 10 MG tablet; Take 1 tablet (10 mg total) by mouth daily.  Dispense: 90 tablet; Refill: 1 - lisinopril (ZESTRIL) 20 MG tablet; Take 1 tablet (20 mg total) by mouth daily.  Dispense: 90 tablet; Refill: 1  Chronic Pain:  Surgery has been cancelled Will place referral for pain management   Obstructive sleep apnea of adult:  Patient reports adherence with CPAP   Follow up:  Follow up with Dr. Juleen China     I discussed the assessment and treatment plan with the patient. The patient was provided an opportunity to ask questions and all were answered. The patient agreed with the plan and demonstrated an understanding of the instructions.   The patient was advised to call back or seek an in-person evaluation if the symptoms worsen or if the condition fails to improve as anticipated.  I provided 22 minutes of non-face-to-face time during this encounter.   Fenton Foy, NP

## 2021-02-14 NOTE — Progress Notes (Signed)
Surgery was canceled for hip and knee replacement Would like to look at pain management clinic  Pain 10/10

## 2021-02-17 ENCOUNTER — Other Ambulatory Visit: Payer: Self-pay

## 2021-02-17 ENCOUNTER — Ambulatory Visit
Admission: EM | Admit: 2021-02-17 | Discharge: 2021-02-17 | Disposition: A | Discharge status: Home / Self Care | Payer: 59 | Attending: Physician Assistant | Admitting: Physician Assistant

## 2021-02-17 DIAGNOSIS — M109 Gout, unspecified: Secondary | ICD-10-CM | POA: Diagnosis not present

## 2021-02-17 MED ORDER — PREDNISONE 50 MG PO TABS: ORAL_TABLET | ORAL | 0 refills | Status: DC

## 2021-02-17 NOTE — ED Provider Notes (Signed)
[facility]    CSN: [id number] Arrival date & time: 02/17/21  1510      History   Chief Complaint No chief complaint on file.   HPI Darrell Conway is a 55 y.o. male.   Pt complains of a flare up of gout.   The history is provided by the patient. No language interpreter was used.    Past Medical History:  Diagnosis Date  . Anemia   . Back pain   . GERD (gastroesophageal reflux disease)   . Gout   . Heartburn   . High cholesterol   . Hip pain   . Hypertension   . Knee pain   . Rheumatoid arthritis ([place])   . Sleep apnea    cpap   . Swallowing difficulty     Patient Active Problem List   Diagnosis Date Noted  . Essential hypertension 01/03/2021  . Irregular heart rhythm 02/14/2020  . History of COVID-19 02/13/2020  . Memory loss 02/13/2020  . Poor concentration 02/13/2020  . Muscle pain 02/13/2020  . Unilateral primary osteoarthritis, left hip 01/31/2020  . Morbid (severe) obesity due to excess calories ([place]) 06/29/2019  . Bilateral primary osteoarthritis of knee 05/05/2019  . Primary osteoarthritis of both hips   . Arthralgia of multiple joints 04/26/2019  . Pain of both hip joints 04/26/2019  . Obstructive sleep apnea of adult 04/11/2019  . Laryngopharyngeal reflux (LPR) 04/11/2019    Past Surgical History:  Procedure Laterality Date  . HERNIA REPAIR         Home Medications    Prior to Admission medications   Medication Sig Start Date End Date Taking? Authorizing Provider  predniSONE (DELTASONE) 50 MG tablet One a day for 5 days. 02/17/21  Yes Caryl Ada K, PA-C  amLODipine (NORVASC) 10 MG tablet Take 1 tablet (10 mg total) by mouth daily. 02/14/21   Fenton Foy, NP  atorvastatin (LIPITOR) 20 MG tablet Take 1 tablet (20 mg total) by mouth daily. 02/14/21   Fenton Foy, NP  HYDROcodone-acetaminophen (NORCO/VICODIN) 5-325 MG tablet Take 1 tablet by mouth every 6 (six) hours as needed for moderate pain.    [provider]  lisinopril (ZESTRIL) 20 MG tablet Take 1 tablet (20 mg total) by mouth daily. 02/14/21   Fenton Foy, NP  meloxicam (MOBIC) 15 MG tablet Take 15 mg by mouth daily.    [provider]  Multiple Vitamin (MULTIVITAMIN) capsule Take 1 capsule by mouth daily.    [provider]  OVER THE COUNTER MEDICATION Take 2 each by mouth daily. Blackseed oil Gummies    [provider]  pantoprazole (PROTONIX) 40 MG tablet Take 1 tablet (40 mg total) by mouth 2 (two) times daily before a meal. 02/14/21 05/15/21  Fenton Foy, NP  fluticasone (FLONASE) 50 MCG/ACT nasal spray Place 2 sprays into both nostrils daily. 01/06/20 07/29/20  Nicolette Bang, DO  orlistat (ALLI) 60 MG capsule Take 1 capsule (60 mg total) by mouth 3 (three) times daily with meals. 02/29/20 07/29/20  Nicolette Bang, DO    Family History Family History  Problem Relation Age of Onset  . Rheum arthritis Mother   . Hypertension Mother   . Rheum arthritis Sister     Social History Social History   Tobacco Use  . Smoking status: Former Smoker    Packs/day: 0.75    Types: Cigarettes    Quit date: 2015    Years since quitting: 7.2  .  Smokeless tobacco: Never Used  Vaping Use  . Vaping Use: Never used  Substance Use Topics  . Alcohol use: Never  . Drug use: No     Allergies   Patient has no known allergies.   Review of Systems Review of Systems  All other systems reviewed and are negative.    Physical Exam Triage Vital Signs ED Triage Vitals  Enc Vitals Group     BP 02/17/21 1553 132/84     Pulse Rate 02/17/21 1553 71     Resp 02/17/21 1553 20     Temp 02/17/21 1553 97.9 F (36.6 C)     Temp Source 02/17/21 1553 Oral     SpO2 02/17/21 1553 94 %     Weight --      Height --      Head Circumference --      Peak Flow --      Pain Score 02/17/21 1558 7     Pain Loc --      Pain Edu? --      Excl. in [place]? --    No data found.  Updated Vital  Signs BP 132/84 (BP Location: Left Arm)   Pulse 71   Temp 97.9 F (36.6 C) (Oral)   Resp 20   SpO2 94%   Visual Acuity Right Eye Distance:   Left Eye Distance:   Bilateral Distance:    Right Eye Near:   Left Eye Near:    Bilateral Near:     Physical Exam Vitals and nursing note reviewed.  Constitutional:      Appearance: He is well-developed.  HENT:     Head: Normocephalic and atraumatic.  Cardiovascular:     Rate and Rhythm: Normal rate.  Pulmonary:     Effort: Pulmonary effort is normal.  Musculoskeletal:        General: Swelling present. Normal range of motion.     Comments: Tender left mid foot,  Pain with movement   Skin:    General: Skin is warm and dry.  Neurological:     General: No focal deficit present.     Mental Status: He is alert.  Psychiatric:        Mood and Affect: Mood normal.      UC Treatments / Results  Labs (all labs ordered are listed, but only abnormal results are displayed) Labs Reviewed - No data to display  EKG   Radiology No results found.  Procedures Procedures (including critical care time)  Medications Ordered in UC Medications - No data to display  Initial Impression / Assessment and Plan / UC Course  I have reviewed the triage vital signs and the nursing notes.  Pertinent labs & imaging results that were available during my care of the patient were reviewed by me and considered in my medical decision making (see chart for details).     MDM:  Pt advised to continue meloxicam and prednisone  Final Clinical Impressions(s) / UC Diagnoses   Final diagnoses:  Gout of left foot, unspecified cause, unspecified chronicity   Discharge Instructions   None    ED Prescriptions    Medication Sig Dispense Auth. Provider   predniSONE (DELTASONE) 50 MG tablet One a day for 5 days. 5 tablet Fransico Meadow, [place]     PDMP not reviewed this encounter.  An After Visit Summary was printed and given to the patient.   Fransico Meadow, [place] 02/17/21 [phone]

## 2021-02-17 NOTE — ED Triage Notes (Signed)
Pt presents with right knee pain and left ankle pain, pt states he has h/o gout

## 2021-03-19 ENCOUNTER — Ambulatory Visit: Payer: 59 | Admitting: Internal Medicine

## 2021-03-28 ENCOUNTER — Encounter: Payer: Self-pay | Admitting: Internal Medicine

## 2021-03-28 ENCOUNTER — Ambulatory Visit (INDEPENDENT_AMBULATORY_CARE_PROVIDER_SITE_OTHER): Payer: 59 | Admitting: Internal Medicine

## 2021-03-28 ENCOUNTER — Other Ambulatory Visit: Payer: Self-pay

## 2021-03-28 VITALS — BP 128/84 | HR 69 | Temp 97.3°F | Resp 20 | Wt 289.0 lb

## 2021-03-28 DIAGNOSIS — I10 Essential (primary) hypertension: Secondary | ICD-10-CM | POA: Diagnosis not present

## 2021-03-28 DIAGNOSIS — E785 Hyperlipidemia, unspecified: Secondary | ICD-10-CM | POA: Diagnosis not present

## 2021-03-28 MED ORDER — AMLODIPINE BESYLATE 10 MG PO TABS: 10.0000 mg | ORAL_TABLET | Freq: Every day | ORAL | 1 refills | Status: DC

## 2021-03-28 MED ORDER — LISINOPRIL 20 MG PO TABS: 20.0000 mg | ORAL_TABLET | Freq: Every day | ORAL | 1 refills | Status: DC

## 2021-03-28 MED ORDER — ATORVASTATIN CALCIUM 20 MG PO TABS
20.0000 mg | ORAL_TABLET | Freq: Every day | ORAL | 3 refills | Status: DC
Start: 2021-03-28 — End: 2022-08-20

## 2021-03-28 NOTE — Progress Notes (Signed)
  Subjective:    Darrell Conway - 55 y.o. male MRN [id number]  Date of birth: Apr 25, 1966  HPI  Darrell Conway is here for follow up.  Chronic HTN Disease Monitoring:  Home BP Monitoring - Wife monitors, no concerns  Chest pain- no  Dyspnea- no Headache - no  Medications: Lisinopril 20 mg, Amlodipine 10 mg Compliance- yes Lightheadedness- no  Edema- no    Health Maintenance:  There are no preventive care reminders to display for this patient.  -  reports that he quit smoking about 7 years ago. His smoking use included cigarettes. He smoked 0.75 packs per day. He has never used smokeless tobacco. - Review of Systems: Per HPI. - Past Medical History: Patient Active Problem List   Diagnosis Date Noted  . Essential hypertension 01/03/2021  . Irregular heart rhythm 02/14/2020  . History of COVID-19 02/13/2020  . Memory loss 02/13/2020  . Poor concentration 02/13/2020  . Muscle pain 02/13/2020  . Unilateral primary osteoarthritis, left hip 01/31/2020  . Morbid (severe) obesity due to excess calories ([place]) 06/29/2019  . Bilateral primary osteoarthritis of knee 05/05/2019  . Primary osteoarthritis of both hips   . Arthralgia of multiple joints 04/26/2019  . Pain of both hip joints 04/26/2019  . Obstructive sleep apnea of adult 04/11/2019  . Laryngopharyngeal reflux (LPR) 04/11/2019   - Medications: reviewed and updated   Objective:   Physical Exam BP 128/84   Pulse 69   Resp 20   SpO2 94%  Physical Exam Constitutional:      General: He is not in acute distress.    Appearance: He is not diaphoretic.  Cardiovascular:     Rate and Rhythm: Normal rate.  Pulmonary:     Effort: Pulmonary effort is normal. No respiratory distress.  Musculoskeletal:        General: Normal range of motion.  Skin:    General: Skin is warm and dry.  Neurological:     Mental Status: He is alert and oriented to person, place, and time.  Psychiatric:        Mood and Affect:  Affect normal.        Judgment: Judgment normal.            Assessment & Plan:   1. Essential hypertension BP well controlled. Asymptomatic. Continue current regimen.  - amLODipine (NORVASC) 10 MG tablet; Take 1 tablet (10 mg total) by mouth daily.  Dispense: 90 tablet; Refill: 1 - lisinopril (ZESTRIL) 20 MG tablet; Take 1 tablet (20 mg total) by mouth daily.  Dispense: 90 tablet; Refill: 1  2. Hyperlipidemia, unspecified hyperlipidemia type - atorvastatin (LIPITOR) 20 MG tablet; Take 1 tablet (20 mg total) by mouth daily.  Dispense: 90 tablet; Refill: [place], D.O. 03/28/2021, 11:25 AM Primary Care at [facility]

## 2021-07-11 NOTE — Progress Notes (Signed)
Please place orders in epic pt. Is scheduled for preop 

## 2021-07-11 NOTE — Progress Notes (Addendum)
PCP - Phill Myron, DO  LOV 03-28-2021 epic Cardiologist - no  PPM/ICD -  Device Orders -  Rep Notified -   Chest x-ray -  EKG - 12-31-20 epic Stress Test -  ECHO -  Cardiac Cath -   Sleep Study -  CPAP -   Fasting Blood Sugar -  Checks Blood Sugar _____ times a day  Blood Thinner Instructions: Aspirin Instructions:  ERAS Protcol - PRE-SURGERY Ensure or G2-   COVID TEST- 07-26-21 COVID vaccine -fully vaccinated pfizer   Activity--  able to walk a flight of stairs without SOB Anesthesia review: HTN, OSA  Patient denies shortness of breath, fever, cough and chest pain at PAT appointment   All instructions explained to the patient, with a verbal understanding of the material. Patient agrees to go over the instructions while at home for a better understanding. Patient also instructed to self quarantine after being tested for COVID-19. The opportunity to ask questions was provided.

## 2021-07-11 NOTE — Patient Instructions (Addendum)
DUE TO COVID-19 ONLY ONE VISITOR IS ALLOWED TO COME WITH YOU AND STAY IN THE WAITING ROOM ONLY DURING PRE OP AND PROCEDURE DAY OF SURGERY.   TWO VISITOR  MAY VISIT WITH YOU AFTER SURGERY IN YOUR PRIVATE ROOM DURING VISITING HOURS ONLY!  YOU NEED TO HAVE A COVID 19 TEST ON__9-2-22_____ '@_______'$ , THIS TEST MUST BE DONE BEFORE SURGERY,    COVID TESTING SITE [place]   [phone]    Bring completed form with you   ONCE YOUR COVID TEST IS COMPLETED,  PLEASE Wear a mask when in public           Your procedure is scheduled on: 07-30-21   Report to [facility] Main  Entrance   Report to admitting at       [place] AM     Call this number if you have problems the morning of surgery [phone]   Remember: Do not eat food  :After Midnight. You may have clear liquids until 0700 am then nothing by mouth    CLEAR LIQUID DIET                                                                    water Black Coffee and tea, regular and decaf                             Plain Jell-O any favor except red or purple                                  Fruit ices (not with fruit pulp)                                      Iced Popsicles                                     Carbonated beverages, regular and diet                                    Cranberry, grape and apple juices Sports drinks like Gatorade Lightly seasoned clear broth or consume(fat free) Sugar, honey syrup  _____________________________________________________________________     BRUSH YOUR TEETH MORNING OF SURGERY AND RINSE YOUR MOUTH OUT, NO CHEWING GUM CANDY OR MINTS.     Take these medicines the morning of surgery with A SIP OF WATER: pantoprazole if needed, atorvastatin, amlodipine                                 You may not have any metal on your body including hair pins and              piercings  Do not wear jewelry,  lotions, powders,perfumes,      deodorant  Men  may shave face and neck.   Do not bring valuables to the hospital. [place].  Contacts, dentures or bridgework may not be worn into surgery.       Patients discharged the day of surgery will not be allowed to drive home. IF YOU ARE HAVING SURGERY AND GOING HOME THE SAME DAY, YOU MUST HAVE AN ADULT TO DRIVE YOU HOME AND BE WITH YOU FOR 24 HOURS. YOU MAY GO HOME BY TAXI OR UBER OR ORTHERWISE, BUT AN ADULT MUST ACCOMPANY YOU HOME AND STAY WITH YOU FOR 24 HOURS.  Name and phone number of your driver:  Special Instructions: N/A              Please read over the following fact sheets you were given: _____________________________________________________________________             [facility] - Preparing for Surgery Before surgery, you can play an important role.  Because skin is not sterile, your skin needs to be as free of germs as possible.  You can reduce the number of germs on your skin by washing with CHG (chlorahexidine gluconate) soap before surgery.  CHG is an antiseptic cleaner which kills germs and bonds with the skin to continue killing germs even after washing. Please DO NOT use if you have an allergy to CHG or antibacterial soaps.  If your skin becomes reddened/irritated stop using the CHG and inform your nurse when you arrive at Short Stay. Do not shave (including legs and underarms) for at least 48 hours prior to the first CHG shower.  You may shave your face/neck. Please follow these instructions carefully:  1.  Shower with CHG Soap the night before surgery and the  morning of Surgery.  2.  If you choose to wash your hair, wash your hair first as usual with your  normal  shampoo.  3.  After you shampoo, rinse your hair and body thoroughly to remove the  shampoo.                           4.  Use CHG as you would any other liquid soap.  You can apply chg directly  to the skin and wash                       Gently with a scrungie or  clean washcloth.  5.  Apply the CHG Soap to your body ONLY FROM THE NECK DOWN.   Do not use on face/ open                           Wound or open sores. Avoid contact with eyes, ears mouth and genitals (private parts).                       Wash face,  Genitals (private parts) with your normal soap.             6.  Wash thoroughly, paying special attention to the area where your surgery  will be performed.  7.  Thoroughly rinse your body with warm water from the neck down.  8.  DO NOT shower/wash with your normal soap after using and rinsing off  the CHG Soap.  9.  Pat yourself dry with a clean towel.            10.  Wear clean pajamas.            11.  Place clean sheets on your bed the night of your first shower and do not  sleep with pets. Day of Surgery : Do not apply any lotions/deodorants the morning of surgery.  Please wear clean clothes to the hospital/surgery center.  FAILURE TO FOLLOW THESE INSTRUCTIONS MAY RESULT IN THE CANCELLATION OF YOUR SURGERY PATIENT SIGNATURE_________________________________  NURSE SIGNATURE__________________________________   Incentive Spirometer  An incentive spirometer is a tool that can help keep your lungs clear and active. This tool measures how well you are filling your lungs with each breath. Taking long deep breaths may help reverse or decrease the chance of developing breathing (pulmonary) problems (especially infection) following: A long period of time when you are unable to move or be active. BEFORE THE PROCEDURE  If the spirometer includes an indicator to show your best effort, your nurse or respiratory therapist will set it to a desired goal. If possible, sit up straight or lean slightly forward. Try not to slouch. Hold the incentive spirometer in an upright position. INSTRUCTIONS FOR USE  Sit on the edge of your bed if possible, or sit up as far as you can in bed or on a chair. Hold the incentive spirometer in an upright  position. Breathe out normally. Place the mouthpiece in your mouth and seal your lips tightly around it. Breathe in slowly and as deeply as possible, raising the piston or the ball toward the top of the column. Hold your breath for 3-5 seconds or for as long as possible. Allow the piston or ball to fall to the bottom of the column. Remove the mouthpiece from your mouth and breathe out normally. Rest for a few seconds and repeat Steps 1 through 7 at least 10 times every 1-2 hours when you are awake. Take your time and take a few normal breaths between deep breaths. The spirometer may include an indicator to show your best effort. Use the indicator as a goal to work toward during each repetition. After each set of 10 deep breaths, practice coughing to be sure your lungs are clear. If you have an incision (the cut made at the time of surgery), support your incision when coughing by placing a pillow or rolled up towels firmly against it. Once you are able to get out of bed, walk around indoors and cough well. You may stop using the incentive spirometer when instructed by your caregiver.  RISKS AND COMPLICATIONS Take your time so you do not get dizzy or light-headed. If you are in pain, you may need to take or ask for pain medication before doing incentive spirometry. It is harder to take a deep breath if you are having pain. AFTER USE Rest and breathe slowly and easily. It can be helpful to keep track of a log of your progress. Your caregiver can provide you with a simple table to help with this. If you are using the spirometer at home, follow these instructions: [place] IF:  You are having difficultly using the spirometer. You have trouble using the spirometer as often as instructed. Your pain medication is not giving enough relief while using the spirometer. You develop fever of 100.5 F (38.1 C) or higher. SEEK IMMEDIATE MEDICAL CARE IF:  You cough up bloody sputum that had not been  present before. You develop fever of 102 F (38.9 C) or greater. You develop worsening pain at or near the incision site. MAKE SURE YOU:  Understand these instructions. Will watch your condition. Will get help right away if you are not doing well or get worse. Document Released: 03/23/2007 Document Revised: 02/02/2012 Document Reviewed: 05/24/2007 [facility] Patient Information 2014 [place], [place].   _______________________________________________________________________

## 2021-07-18 ENCOUNTER — Encounter (HOSPITAL_COMMUNITY): Payer: Self-pay

## 2021-07-18 ENCOUNTER — Encounter (HOSPITAL_COMMUNITY)
Admission: RE | Admit: 2021-07-18 | Discharge: 2021-07-18 | Disposition: A | Discharge status: Home / Self Care | Payer: 59 | Source: Ambulatory Visit | Attending: Orthopedic Surgery | Admitting: Orthopedic Surgery

## 2021-07-18 ENCOUNTER — Other Ambulatory Visit: Payer: Self-pay

## 2021-07-18 DIAGNOSIS — Z01812 Encounter for preprocedural laboratory examination: Secondary | ICD-10-CM | POA: Insufficient documentation

## 2021-07-18 HISTORY — DX: Pneumonia, unspecified organism: J18.9

## 2021-07-18 LAB — COMPREHENSIVE METABOLIC PANEL
ALT: 58 U/L — ABNORMAL HIGH (ref 0–44)
AST: 32 U/L (ref 15–41)
Albumin: 4.4 g/dL (ref 3.5–5.0)
Alkaline Phosphatase: 77 U/L (ref 38–126)
Anion gap: 6 (ref 5–15)
BUN: 12 mg/dL (ref 6–20)
CO2: 24 mmol/L (ref 22–32)
Calcium: 9.5 mg/dL (ref 8.9–10.3)
Chloride: 107 mmol/L (ref 98–111)
Creatinine, Ser: 0.76 mg/dL (ref 0.61–1.24)
GFR, Estimated: >60 mL/min (ref >60)
Glucose, Bld: 97 mg/dL (ref 70–99)
Potassium: 4 mmol/L (ref 3.5–5.1)
Sodium: 137 mmol/L (ref 135–145)
Total Bilirubin: 0.8 mg/dL (ref 0.3–1.2)
Total Protein: 8 g/dL (ref 6.5–8.1)

## 2021-07-18 LAB — TYPE AND SCREEN
ABO/RH(D): O POS
Antibody Screen: NEGATIVE

## 2021-07-18 LAB — CBC
HCT: 44.8 % (ref 39.0–52.0)
Hemoglobin: 14.7 g/dL (ref 13.0–17.0)
MCH: 28.8 pg (ref 26.0–34.0)
MCHC: 32.8 g/dL (ref 30.0–36.0)
MCV: 87.8 fL (ref 80.0–100.0)
Platelets: 241 10*3/uL (ref 150–400)
RBC: 5.1 MIL/uL (ref 4.22–5.81)
RDW: 13.2 % (ref 11.5–15.5)
WBC: 6.8 10*3/uL (ref 4.0–10.5)
nRBC: 0 % (ref 0.0–0.2)

## 2021-07-18 LAB — SURGICAL PCR SCREEN
MRSA, PCR: NEGATIVE
Staphylococcus aureus: NEGATIVE

## 2021-07-25 NOTE — H&P (Signed)
TOTAL KNEE ADMISSION H&P  Patient is being admitted for right total knee arthroplasty.  Subjective:  Chief Complaint:right knee pain.  HPI: Darrell Conway, 55 y.o. male, has a history of pain and functional disability in the right knee due to arthritis and has failed non-surgical conservative treatments for greater than 12 weeks to includecorticosteriod injections and activity modification.  Onset of symptoms was gradual, starting 2 years ago with gradually worsening course since that time. The patient noted no past surgery on the right knee(s).  Patient currently rates pain in the right knee(s) at 8 out of 10 with activity. Patient has worsening of pain with activity and weight bearing and pain that interferes with activities of daily living.  Patient has evidence of joint space narrowing by imaging studies. There is no active infection.  Patient Active Problem List   Diagnosis Date Noted   Essential hypertension 01/03/2021   Irregular heart rhythm 02/14/2020   History of COVID-19 02/13/2020   Memory loss 02/13/2020   Poor concentration 02/13/2020   Muscle pain 02/13/2020   Unilateral primary osteoarthritis, left hip 01/31/2020   Morbid (severe) obesity due to excess calories ([place]) 06/29/2019   Bilateral primary osteoarthritis of knee 05/05/2019   Primary osteoarthritis of both hips    Arthralgia of multiple joints 04/26/2019   Pain of both hip joints 04/26/2019   Obstructive sleep apnea of adult 04/11/2019   Laryngopharyngeal reflux (LPR) 04/11/2019   Past Medical History:  Diagnosis Date   Anemia    pt denies   Back pain    GERD (gastroesophageal reflux disease)    Gout    Heartburn    High cholesterol    Hip pain    Hypertension    Knee pain    Pneumonia    Rheumatoid arthritis ([facility])    Sleep apnea    no Cpap does not wear   Swallowing difficulty     Past Surgical History:  Procedure Laterality Date   HERNIA REPAIR      No current facility-administered medications  for this encounter.   Current Outpatient Medications  Medication Sig Dispense Refill Last Dose   amLODipine (NORVASC) 10 MG tablet Take 1 tablet (10 mg total) by mouth daily. 90 tablet 1    atorvastatin (LIPITOR) 20 MG tablet Take 1 tablet (20 mg total) by mouth daily. 90 tablet 3    HYDROcodone-acetaminophen (NORCO/VICODIN) 5-325 MG tablet Take 1 tablet by mouth every 6 (six) hours as needed for moderate pain.      lisinopril (ZESTRIL) 20 MG tablet Take 1 tablet (20 mg total) by mouth daily. 90 tablet 1    Multiple Vitamin (MULTIVITAMIN) capsule Take 1 capsule by mouth daily.      pantoprazole (PROTONIX) 40 MG tablet Take 1 tablet (40 mg total) by mouth 2 (two) times daily before a meal. (Patient taking differently: Take 40 mg by mouth 2 (two) times daily as needed (acid reflux).) 180 tablet 0    No Known Allergies  Social History   Tobacco Use   Smoking status: Former    Packs/day: 0.75    Types: Cigarettes    Quit date: 2015    Years since quitting: 7.6   Smokeless tobacco: Never  Substance Use Topics   Alcohol use: Not Currently    Family History  Problem Relation Age of Onset   Rheum arthritis Mother    Hypertension Mother    Rheum arthritis Sister      Review of Systems  Constitutional:  Negative  for chills and fever.  Respiratory:  Negative for cough and shortness of breath.   Cardiovascular:  Negative for chest pain.  Gastrointestinal:  Negative for nausea and vomiting.  Musculoskeletal:  Positive for arthralgias.   Objective:  Physical Exam Well nourished and well developed. General: Alert and oriented x3, cooperative and pleasant, no acute distress. Head: normocephalic, atraumatic, neck supple. Eyes: EOMI.  Musculoskeletal: Bilateral knee exams: No palpable effusions, warmth erythema Slight varus Slight flexion contractures Flexion over 110 degrees with some tightness over the anterior medial aspect of knees Neurovascular intact distally without lower  extremity edema erythema or calf tenderness   Calves soft and nontender. Motor function intact in LE. Strength 5/5 LE bilaterally. Neuro: Distal pulses 2+. Sensation to light touch intact in LE.  Vital signs in last 24 hours:    Labs:   Estimated body mass index is 39.47 kg/m as calculated from the following:   Height as of 07/18/21: 6' (1.829 m).   Weight as of 07/18/21: 132 kg.   Imaging Review Plain radiographs demonstrate severe degenerative joint disease of the right knee(s). The overall alignment isneutral. The bone quality appears to be adequate for age and reported activity level.      Assessment/Plan:  End stage arthritis, right knee   The patient history, physical examination, clinical judgment of the provider and imaging studies are consistent with end stage degenerative joint disease of the right knee(s) and total knee arthroplasty is deemed medically necessary. The treatment options including medical management, injection therapy arthroscopy and arthroplasty were discussed at length. The risks and benefits of total knee arthroplasty were presented and reviewed. The risks due to aseptic loosening, infection, stiffness, patella tracking problems, thromboembolic complications and other imponderables were discussed. The patient acknowledged the explanation, agreed to proceed with the plan and consent was signed. Patient is being admitted for inpatient treatment for surgery, pain control, PT, OT, prophylactic antibiotics, VTE prophylaxis, progressive ambulation and ADL's and discharge planning. The patient is planning to be discharged  home.   Therapy Plans: outpatient therapy at [facility] Disposition: Home with wife Planned DVT Prophylaxis: aspirin '81mg'$  BID DME needed: walker, 3-n-1 PCP: Camillia Herter, awaiting clearance TXA: IV Allergies: NKDA Anesthesia Concerns: None BMI: 40.3 Last HgbA1c: Not diabetic  Other: - Oxycodone, robaxin, celebrex,  toradol   Patient's anticipated LOS is less than 2 midnights, meeting these requirements: - Younger than 82 - Lives within 1 hour of care - Has a competent adult at home to recover with post-op recover - NO history of  - Chronic pain requiring opiods  - Diabetes  - Coronary Artery Disease  - Heart failure  - Heart attack  - Stroke  - DVT/VTE  - Cardiac arrhythmia  - Respiratory Failure/COPD  - Renal failure  - Anemia  - Advanced Liver disease  Griffith Citron, PA-C Orthopedic Surgery [facility] [place] [phone]

## 2021-07-30 ENCOUNTER — Other Ambulatory Visit: Payer: Self-pay

## 2021-07-30 ENCOUNTER — Encounter (HOSPITAL_COMMUNITY)
Admission: AD | Disposition: A | Discharge status: Home / Self Care | Payer: Self-pay | Source: Home / Self Care | Attending: Orthopedic Surgery

## 2021-07-30 ENCOUNTER — Inpatient Hospital Stay (HOSPITAL_COMMUNITY)
Admission: AD | Admit: 2021-07-30 | Discharge: 2021-08-02 | DRG: 470 | Disposition: A | Discharge status: Home / Self Care | Payer: No Typology Code available for payment source | Attending: Orthopedic Surgery | Admitting: Orthopedic Surgery

## 2021-07-30 ENCOUNTER — Ambulatory Visit (HOSPITAL_COMMUNITY): Payer: No Typology Code available for payment source | Admitting: Anesthesiology

## 2021-07-30 ENCOUNTER — Encounter (HOSPITAL_COMMUNITY): Payer: Self-pay | Admitting: Orthopedic Surgery

## 2021-07-30 DIAGNOSIS — M1711 Unilateral primary osteoarthritis, right knee: Principal | ICD-10-CM | POA: Diagnosis present

## 2021-07-30 DIAGNOSIS — K219 Gastro-esophageal reflux disease without esophagitis: Secondary | ICD-10-CM | POA: Diagnosis present

## 2021-07-30 DIAGNOSIS — Z8261 Family history of arthritis: Secondary | ICD-10-CM

## 2021-07-30 DIAGNOSIS — Z96659 Presence of unspecified artificial knee joint: Secondary | ICD-10-CM | POA: Insufficient documentation

## 2021-07-30 DIAGNOSIS — Z8249 Family history of ischemic heart disease and other diseases of the circulatory system: Secondary | ICD-10-CM

## 2021-07-30 DIAGNOSIS — I10 Essential (primary) hypertension: Secondary | ICD-10-CM | POA: Diagnosis present

## 2021-07-30 DIAGNOSIS — Z87891 Personal history of nicotine dependence: Secondary | ICD-10-CM

## 2021-07-30 DIAGNOSIS — M160 Bilateral primary osteoarthritis of hip: Secondary | ICD-10-CM | POA: Diagnosis present

## 2021-07-30 DIAGNOSIS — Z79899 Other long term (current) drug therapy: Secondary | ICD-10-CM

## 2021-07-30 DIAGNOSIS — Z8616 Personal history of COVID-19: Secondary | ICD-10-CM

## 2021-07-30 DIAGNOSIS — G4733 Obstructive sleep apnea (adult) (pediatric): Secondary | ICD-10-CM | POA: Diagnosis present

## 2021-07-30 DIAGNOSIS — Z96651 Presence of right artificial knee joint: Secondary | ICD-10-CM

## 2021-07-30 DIAGNOSIS — E7800 Pure hypercholesterolemia, unspecified: Secondary | ICD-10-CM | POA: Diagnosis present

## 2021-07-30 DIAGNOSIS — Z6839 Body mass index (BMI) 39.0-39.9, adult: Secondary | ICD-10-CM

## 2021-07-30 DIAGNOSIS — M069 Rheumatoid arthritis, unspecified: Secondary | ICD-10-CM | POA: Diagnosis present

## 2021-07-30 HISTORY — PX: TOTAL KNEE ARTHROPLASTY: SHX125

## 2021-07-30 LAB — SARS CORONAVIRUS 2 BY RT PCR (HOSPITAL ORDER, PERFORMED IN ~~LOC~~ HOSPITAL LAB): SARS Coronavirus 2: NEGATIVE

## 2021-07-30 SURGERY — ARTHROPLASTY, KNEE, TOTAL
Anesthesia: Spinal | Site: Knee | Laterality: Right

## 2021-07-30 MED ORDER — BUPIVACAINE IN DEXTROSE 0.75-8.25 % IT SOLN
INTRATHECAL | Status: DC | PRN
Start: 2021-07-30 — End: 2021-07-30
  Administered 2021-07-30: 1.6 mL via INTRATHECAL

## 2021-07-30 MED ORDER — ONDANSETRON HCL 4 MG/2ML IJ SOLN
INTRAMUSCULAR | Status: DC | PRN
  Administered 2021-07-30: 4 mg via INTRAVENOUS

## 2021-07-30 MED ORDER — OXYCODONE HCL 5 MG PO TABS
10.0000 mg | ORAL_TABLET | ORAL | Status: DC | PRN
  Administered 2021-07-30 – 2021-08-02 (×15): 15 mg via ORAL
  Filled 2021-07-30 (×15): qty 3

## 2021-07-30 MED ORDER — TRANEXAMIC ACID-NACL 1000-0.7 MG/100ML-% IV SOLN
1000.0000 mg | Freq: Once | INTRAVENOUS | Status: AC
  Administered 2021-07-30: 1000 mg via INTRAVENOUS
  Filled 2021-07-30: qty 100

## 2021-07-30 MED ORDER — DEXAMETHASONE SODIUM PHOSPHATE 10 MG/ML IJ SOLN
8.0000 mg | Freq: Once | INTRAMUSCULAR | Status: AC
  Administered 2021-07-30: 8 mg via INTRAVENOUS

## 2021-07-30 MED ORDER — POVIDONE-IODINE 10 % EX SWAB: 2.0000 "application " | Freq: Once | CUTANEOUS | Status: DC

## 2021-07-30 MED ORDER — ACETAMINOPHEN 10 MG/ML IV SOLN: 1000.0000 mg | Freq: Once | INTRAVENOUS | Status: DC | PRN

## 2021-07-30 MED ORDER — AMISULPRIDE (ANTIEMETIC) 5 MG/2ML IV SOLN: 10.0000 mg | Freq: Once | INTRAVENOUS | Status: DC | PRN

## 2021-07-30 MED ORDER — PROPOFOL 10 MG/ML IV BOLUS
INTRAVENOUS | Status: DC | PRN
  Administered 2021-07-30: 40 mg via INTRAVENOUS
  Administered 2021-07-30: 30 mg via INTRAVENOUS
  Administered 2021-07-30: 20 mg via INTRAVENOUS

## 2021-07-30 MED ORDER — FERROUS SULFATE 325 (65 FE) MG PO TABS
325.0000 mg | ORAL_TABLET | Freq: Three times a day (TID) | ORAL | Status: DC
  Administered 2021-08-02 (×2): 325 mg via ORAL
  Filled 2021-07-30 (×3): qty 1

## 2021-07-30 MED ORDER — STERILE WATER FOR IRRIGATION IR SOLN
Status: DC | PRN
  Administered 2021-07-30: 2000 mL

## 2021-07-30 MED ORDER — PROPOFOL 1000 MG/100ML IV EMUL
INTRAVENOUS | Status: AC
  Filled 2021-07-30: qty 100

## 2021-07-30 MED ORDER — BISACODYL 10 MG RE SUPP: 10.0000 mg | Freq: Every day | RECTAL | Status: DC | PRN

## 2021-07-30 MED ORDER — LISINOPRIL 20 MG PO TABS: 20.0000 mg | ORAL_TABLET | Freq: Every day | ORAL | Status: DC

## 2021-07-30 MED ORDER — SODIUM CHLORIDE (PF) 0.9 % IJ SOLN
INTRAMUSCULAR | Status: AC
  Filled 2021-07-30: qty 30

## 2021-07-30 MED ORDER — KETOROLAC TROMETHAMINE 30 MG/ML IJ SOLN
INTRAMUSCULAR | Status: DC | PRN
  Administered 2021-07-30: 30 mg

## 2021-07-30 MED ORDER — LACTATED RINGERS IV SOLN: INTRAVENOUS | Status: DC

## 2021-07-30 MED ORDER — POLYETHYLENE GLYCOL 3350 17 G PO PACK
17.0000 g | PACK | Freq: Every day | ORAL | Status: DC | PRN
  Administered 2021-08-01 (×2): 17 g via ORAL
  Filled 2021-07-30 (×2): qty 1

## 2021-07-30 MED ORDER — TRANEXAMIC ACID-NACL 1000-0.7 MG/100ML-% IV SOLN
1000.0000 mg | INTRAVENOUS | Status: AC
  Administered 2021-07-30: 1000 mg via INTRAVENOUS
  Filled 2021-07-30: qty 100

## 2021-07-30 MED ORDER — ATORVASTATIN CALCIUM 20 MG PO TABS
20.0000 mg | ORAL_TABLET | Freq: Every day | ORAL | Status: DC
  Administered 2021-07-31 – 2021-08-02 (×3): 20 mg via ORAL
  Filled 2021-07-30 (×3): qty 1

## 2021-07-30 MED ORDER — ROPIVACAINE HCL 5 MG/ML IJ SOLN
INTRAMUSCULAR | Status: DC | PRN
  Administered 2021-07-30: 30 mL via PERINEURAL

## 2021-07-30 MED ORDER — SODIUM CHLORIDE (PF) 0.9 % IJ SOLN
INTRAMUSCULAR | Status: DC | PRN
  Administered 2021-07-30: 30 mL

## 2021-07-30 MED ORDER — DIPHENHYDRAMINE HCL 12.5 MG/5ML PO ELIX: 12.5000 mg | ORAL_SOLUTION | ORAL | Status: DC | PRN

## 2021-07-30 MED ORDER — ACETAMINOPHEN 325 MG PO TABS: 325.0000 mg | ORAL_TABLET | Freq: Once | ORAL | Status: DC | PRN

## 2021-07-30 MED ORDER — CHLORHEXIDINE GLUCONATE 0.12 % MT SOLN: 15.0000 mL | Freq: Once | OROMUCOSAL | Status: DC

## 2021-07-30 MED ORDER — SODIUM CHLORIDE 0.9 % IV SOLN: INTRAVENOUS | Status: DC

## 2021-07-30 MED ORDER — KETOROLAC TROMETHAMINE 15 MG/ML IJ SOLN
15.0000 mg | Freq: Four times a day (QID) | INTRAMUSCULAR | Status: AC
  Administered 2021-07-30 – 2021-07-31 (×4): 15 mg via INTRAVENOUS
  Filled 2021-07-30 (×4): qty 1

## 2021-07-30 MED ORDER — PHENYLEPHRINE 40 MCG/ML (10ML) SYRINGE FOR IV PUSH (FOR BLOOD PRESSURE SUPPORT)
PREFILLED_SYRINGE | INTRAVENOUS | Status: DC | PRN
  Administered 2021-07-30 (×3): 80 ug via INTRAVENOUS

## 2021-07-30 MED ORDER — KETOROLAC TROMETHAMINE 30 MG/ML IJ SOLN
INTRAMUSCULAR | Status: AC
  Filled 2021-07-30: qty 1

## 2021-07-30 MED ORDER — AMLODIPINE BESYLATE 10 MG PO TABS
10.0000 mg | ORAL_TABLET | Freq: Every day | ORAL | Status: DC
  Administered 2021-07-31 – 2021-08-02 (×3): 10 mg via ORAL
  Filled 2021-07-30 (×3): qty 1

## 2021-07-30 MED ORDER — 0.9 % SODIUM CHLORIDE (POUR BTL) OPTIME
TOPICAL | Status: DC | PRN
  Administered 2021-07-30: 1000 mL

## 2021-07-30 MED ORDER — DEXAMETHASONE SODIUM PHOSPHATE 10 MG/ML IJ SOLN
INTRAMUSCULAR | Status: AC
  Filled 2021-07-30: qty 1

## 2021-07-30 MED ORDER — ACETAMINOPHEN 160 MG/5ML PO SOLN: 325.0000 mg | Freq: Once | ORAL | Status: DC | PRN

## 2021-07-30 MED ORDER — METOCLOPRAMIDE HCL 5 MG/ML IJ SOLN: 5.0000 mg | Freq: Three times a day (TID) | INTRAMUSCULAR | Status: DC | PRN

## 2021-07-30 MED ORDER — ONDANSETRON HCL 4 MG/2ML IJ SOLN
4.0000 mg | Freq: Four times a day (QID) | INTRAMUSCULAR | Status: DC | PRN
  Administered 2021-07-30 – 2021-08-01 (×2): 4 mg via INTRAVENOUS
  Filled 2021-07-30 (×2): qty 2

## 2021-07-30 MED ORDER — CEFAZOLIN IN SODIUM CHLORIDE 3-0.9 GM/100ML-% IV SOLN
3.0000 g | INTRAVENOUS | Status: AC
  Administered 2021-07-30: 3 g via INTRAVENOUS
  Filled 2021-07-30: qty 100

## 2021-07-30 MED ORDER — METHOCARBAMOL 500 MG IVPB - SIMPLE MED
500.0000 mg | Freq: Four times a day (QID) | INTRAVENOUS | Status: DC | PRN
  Filled 2021-07-30 (×2): qty 50

## 2021-07-30 MED ORDER — DOCUSATE SODIUM 100 MG PO CAPS
100.0000 mg | ORAL_CAPSULE | Freq: Two times a day (BID) | ORAL | Status: DC
  Administered 2021-07-30 – 2021-08-02 (×6): 100 mg via ORAL
  Filled 2021-07-30 (×6): qty 1

## 2021-07-30 MED ORDER — METOCLOPRAMIDE HCL 5 MG PO TABS: 5.0000 mg | ORAL_TABLET | Freq: Three times a day (TID) | ORAL | Status: DC | PRN

## 2021-07-30 MED ORDER — CEFAZOLIN SODIUM-DEXTROSE 2-4 GM/100ML-% IV SOLN
2.0000 g | Freq: Four times a day (QID) | INTRAVENOUS | Status: AC
Start: 2021-07-30 — End: 2021-07-31
  Administered 2021-07-30 (×2): 2 g via INTRAVENOUS
  Filled 2021-07-30 (×2): qty 100

## 2021-07-30 MED ORDER — PHENYLEPHRINE HCL-NACL 20-0.9 MG/250ML-% IV SOLN
INTRAVENOUS | Status: DC | PRN
  Administered 2021-07-30: 25 ug/min via INTRAVENOUS

## 2021-07-30 MED ORDER — PROMETHAZINE HCL 25 MG/ML IJ SOLN: 6.2500 mg | INTRAMUSCULAR | Status: DC | PRN

## 2021-07-30 MED ORDER — PHENYLEPHRINE 40 MCG/ML (10ML) SYRINGE FOR IV PUSH (FOR BLOOD PRESSURE SUPPORT)
PREFILLED_SYRINGE | INTRAVENOUS | Status: AC
  Filled 2021-07-30: qty 10

## 2021-07-30 MED ORDER — OXYCODONE HCL 5 MG PO TABS: 5.0000 mg | ORAL_TABLET | ORAL | Status: DC | PRN

## 2021-07-30 MED ORDER — PANTOPRAZOLE SODIUM 40 MG PO TBEC: 40.0000 mg | DELAYED_RELEASE_TABLET | Freq: Two times a day (BID) | ORAL | Status: DC | PRN

## 2021-07-30 MED ORDER — ACETAMINOPHEN 325 MG PO TABS
325.0000 mg | ORAL_TABLET | Freq: Four times a day (QID) | ORAL | Status: DC | PRN
  Administered 2021-07-30 – 2021-08-01 (×5): 650 mg via ORAL
  Filled 2021-07-30 (×5): qty 2

## 2021-07-30 MED ORDER — BUPIVACAINE-EPINEPHRINE (PF) 0.25% -1:200000 IJ SOLN
INTRAMUSCULAR | Status: AC
  Filled 2021-07-30: qty 30

## 2021-07-30 MED ORDER — ORAL CARE MOUTH RINSE: 15.0000 mL | Freq: Once | OROMUCOSAL | Status: DC

## 2021-07-30 MED ORDER — ASPIRIN 81 MG PO CHEW
81.0000 mg | CHEWABLE_TABLET | Freq: Two times a day (BID) | ORAL | Status: DC
  Administered 2021-07-30 – 2021-08-02 (×6): 81 mg via ORAL
  Filled 2021-07-30 (×6): qty 1

## 2021-07-30 MED ORDER — ONDANSETRON HCL 4 MG PO TABS: 4.0000 mg | ORAL_TABLET | Freq: Four times a day (QID) | ORAL | Status: DC | PRN

## 2021-07-30 MED ORDER — DEXAMETHASONE SODIUM PHOSPHATE 10 MG/ML IJ SOLN
10.0000 mg | Freq: Once | INTRAMUSCULAR | Status: AC
  Administered 2021-07-31: 10 mg via INTRAVENOUS
  Filled 2021-07-30: qty 1

## 2021-07-30 MED ORDER — MEPERIDINE HCL 50 MG/ML IJ SOLN: 6.2500 mg | INTRAMUSCULAR | Status: DC | PRN

## 2021-07-30 MED ORDER — HYDROMORPHONE HCL 1 MG/ML IJ SOLN
0.5000 mg | INTRAMUSCULAR | Status: DC | PRN
  Administered 2021-07-30 – 2021-07-31 (×3): 1 mg via INTRAVENOUS
  Filled 2021-07-30 (×3): qty 1

## 2021-07-30 MED ORDER — PHENOL 1.4 % MT LIQD: 1.0000 | OROMUCOSAL | Status: DC | PRN

## 2021-07-30 MED ORDER — HYDROMORPHONE HCL 1 MG/ML IJ SOLN: 0.2500 mg | INTRAMUSCULAR | Status: DC | PRN

## 2021-07-30 MED ORDER — FENTANYL CITRATE PF 50 MCG/ML IJ SOSY
50.0000 ug | PREFILLED_SYRINGE | Freq: Once | INTRAMUSCULAR | Status: AC
  Administered 2021-07-30: 50 ug via INTRAVENOUS
  Filled 2021-07-30: qty 2

## 2021-07-30 MED ORDER — METHOCARBAMOL 500 MG PO TABS
500.0000 mg | ORAL_TABLET | Freq: Four times a day (QID) | ORAL | Status: DC | PRN
  Administered 2021-07-30 – 2021-08-02 (×9): 500 mg via ORAL
  Filled 2021-07-30 (×9): qty 1

## 2021-07-30 MED ORDER — PROPOFOL 500 MG/50ML IV EMUL
INTRAVENOUS | Status: DC | PRN
  Administered 2021-07-30: 75 ug/kg/min via INTRAVENOUS

## 2021-07-30 MED ORDER — ONDANSETRON HCL 4 MG/2ML IJ SOLN
INTRAMUSCULAR | Status: AC
  Filled 2021-07-30: qty 2

## 2021-07-30 MED ORDER — CELECOXIB 200 MG PO CAPS
200.0000 mg | ORAL_CAPSULE | Freq: Two times a day (BID) | ORAL | Status: DC
  Administered 2021-07-31 – 2021-08-02 (×4): 200 mg via ORAL
  Filled 2021-07-30 (×4): qty 1

## 2021-07-30 MED ORDER — BUPIVACAINE-EPINEPHRINE (PF) 0.25% -1:200000 IJ SOLN
INTRAMUSCULAR | Status: DC | PRN
  Administered 2021-07-30: 30 mL

## 2021-07-30 MED ORDER — MIDAZOLAM HCL 2 MG/2ML IJ SOLN
1.0000 mg | Freq: Once | INTRAMUSCULAR | Status: AC
  Administered 2021-07-30: 2 mg via INTRAVENOUS
  Filled 2021-07-30: qty 2

## 2021-07-30 MED ORDER — MENTHOL 3 MG MT LOZG: 1.0000 | LOZENGE | OROMUCOSAL | Status: DC | PRN

## 2021-07-30 SURGICAL SUPPLY — 54 items
ATTUNE MED ANAT PAT 38 KNEE (Knees) ×2 IMPLANT
ATTUNE PSFEM RTSZ6 NARCEM KNEE (Femur) ×2 IMPLANT
ATTUNE PSRP INSR SZ6 5 KNEE (Insert) ×2 IMPLANT
BAG COUNTER SPONGE SURGICOUNT (BAG) IMPLANT
BAG ZIPLOCK 12X15 (MISCELLANEOUS) IMPLANT
BASE TIBIA ATTUNE KNEE SYS SZ6 (Knees) ×1 IMPLANT
BLADE SAW SGTL 11.0X1.19X90.0M (BLADE) IMPLANT
BLADE SAW SGTL 13.0X1.19X90.0M (BLADE) ×2 IMPLANT
BLADE SURG SZ10 CARB STEEL (BLADE) ×4 IMPLANT
BNDG ELASTIC 6X5.8 VLCR STR LF (GAUZE/BANDAGES/DRESSINGS) ×2 IMPLANT
BOWL SMART MIX CTS (DISPOSABLE) ×2 IMPLANT
CEMENT HV SMART SET (Cement) ×4 IMPLANT
CUFF TOURN SGL QUICK 34 (TOURNIQUET CUFF) ×1
CUFF TRNQT CYL 34X4.125X (TOURNIQUET CUFF) ×1 IMPLANT
DECANTER SPIKE VIAL GLASS SM (MISCELLANEOUS) ×4 IMPLANT
DERMABOND ADVANCED (GAUZE/BANDAGES/DRESSINGS) ×1
DERMABOND ADVANCED .7 DNX12 (GAUZE/BANDAGES/DRESSINGS) ×1 IMPLANT
DRAPE INCISE IOBAN 66X45 STRL (DRAPES) ×4 IMPLANT
DRAPE U-SHAPE 47X51 STRL (DRAPES) ×2 IMPLANT
DRESSING AQUACEL AG SP 3.5X10 (GAUZE/BANDAGES/DRESSINGS) ×1 IMPLANT
DRSG AQUACEL AG ADV 3.5X10 (GAUZE/BANDAGES/DRESSINGS) ×2 IMPLANT
DRSG AQUACEL AG SP 3.5X10 (GAUZE/BANDAGES/DRESSINGS) ×2
DURAPREP 26ML APPLICATOR (WOUND CARE) ×4 IMPLANT
ELECT REM PT RETURN 15FT ADLT (MISCELLANEOUS) ×2 IMPLANT
GLOVE SURG ENC MOIS LTX SZ6 (GLOVE) IMPLANT
GLOVE SURG UNDER LTX SZ7.5 (GLOVE) ×2 IMPLANT
GLOVE SURG UNDER POLY LF SZ6.5 (GLOVE) IMPLANT
GLOVE SURG UNDER POLY LF SZ7.5 (GLOVE) ×2 IMPLANT
GOWN STRL REUS W/TWL LRG LVL3 (GOWN DISPOSABLE) ×2 IMPLANT
HANDPIECE INTERPULSE COAX TIP (DISPOSABLE) ×1
HOLDER FOLEY CATH W/STRAP (MISCELLANEOUS) IMPLANT
KIT TURNOVER KIT A (KITS) ×2 IMPLANT
MANIFOLD NEPTUNE II (INSTRUMENTS) ×2 IMPLANT
NDL SAFETY ECLIPSE 18X1.5 (NEEDLE) IMPLANT
NEEDLE HYPO 18GX1.5 SHARP (NEEDLE)
NS IRRIG 1000ML POUR BTL (IV SOLUTION) ×2 IMPLANT
PACK TOTAL KNEE CUSTOM (KITS) ×2 IMPLANT
PIN DRILL FIX HALF THREAD (BIT) ×2 IMPLANT
PIN FIX SIGMA LCS THRD HI (PIN) ×2 IMPLANT
PROTECTOR NERVE ULNAR (MISCELLANEOUS) ×2 IMPLANT
SET HNDPC FAN SPRY TIP SCT (DISPOSABLE) ×1 IMPLANT
SET PAD KNEE POSITIONER (MISCELLANEOUS) ×2 IMPLANT
SUT MNCRL AB 4-0 PS2 18 (SUTURE) ×2 IMPLANT
SUT STRATAFIX PDS+ 0 24IN (SUTURE) ×2 IMPLANT
SUT VIC AB 1 CT1 36 (SUTURE) ×2 IMPLANT
SUT VIC AB 2-0 CT1 27 (SUTURE) ×3
SUT VIC AB 2-0 CT1 TAPERPNT 27 (SUTURE) ×3 IMPLANT
SYR 3ML LL SCALE MARK (SYRINGE) ×2 IMPLANT
TIBIA ATTUNE KNEE SYS BASE SZ6 (Knees) ×2 IMPLANT
TRAY FOLEY MTR SLVR 16FR STAT (SET/KITS/TRAYS/PACK) ×2 IMPLANT
TUBE SUCTION HIGH CAP CLEAR NV (SUCTIONS) ×2 IMPLANT
WATER STERILE IRR 1000ML POUR (IV SOLUTION) ×4 IMPLANT
WRAP KNEE MAXI GEL POST OP (GAUZE/BANDAGES/DRESSINGS) ×2 IMPLANT
YANKAUER SUCT BULB TIP 10FT TU (MISCELLANEOUS) ×2 IMPLANT

## 2021-07-30 NOTE — Evaluation (Signed)
Physical Therapy Evaluation Patient Details Name: Darrell Conway MRN: [id number] DOB: 1966-09-08 Today's Date: 07/30/2021   History of Present Illness  Patient is 55 y.o. male s/p Rt TKA on 07/30/21 with PMH significant for anemia, back pain, GERD, gout, HTN, RA.  Clinical Impression  Lukas Degraffenried is a 55 y.o. male POD 0 s/p Rt TKA. Patient reports independence with mobility at baseline. Patient is now limited by functional impairments (see PT problem list below) and requires min-mod assist for bed mobility and transfers with RW. Patient limited by pain and nausea and unable to take more than single side step at EOB. Patient will benefit from continued skilled PT interventions to address impairments and progress towards PLOF. Acute PT will follow to progress mobility and stair training in preparation for safe discharge home.     Follow Up Recommendations Follow surgeon's recommendation for DC plan and follow-up therapies    Equipment Recommendations  Rolling walker with 5" wheels;3in1 (PT) (double wheeled walker (wide))    Recommendations for Other Services       Precautions / Restrictions Precautions Precautions: Fall Restrictions Weight Bearing Restrictions: No Other Position/Activity Restrictions: WBAT      Mobility  Bed Mobility Overal bed mobility: Needs Assistance Bed Mobility: Supine to Sit;Sit to Supine     Supine to sit: Min assist;HOB elevated Sit to supine: Mod assist   General bed mobility comments: cues for use of bed rail and assist for Rt LE off EOB. Mod assist to control raising bil LE's onto bed to return to supine.    Transfers Overall transfer level: Needs assistance Equipment used: Rolling walker (2 wheeled) Transfers: Sit to/from Stand;Lateral/Scoot Transfers Sit to Stand: Min assist;From elevated surface        Lateral/Scoot Transfers: Min assist General transfer comment: Cues for hand placement and bed significantly elevated. Min assist to complete  power up and steady with rise. pt limited by pain and nausea after small side steps EOB and returned to sitting. Min assist  with bed pad for lateral scoots towards HOB before return to supine.  Ambulation/Gait                Stairs            Wheelchair Mobility    Modified Rankin (Stroke Patients Only)       Balance Overall balance assessment: Needs assistance Sitting-balance support: Feet supported Sitting balance-Leahy Scale: Good     Standing balance support: During functional activity;Bilateral upper extremity supported Standing balance-Leahy Scale: Poor                               Pertinent Vitals/Pain Pain Assessment: 0-10 Pain Score: 10-Worst pain ever Pain Location: Rt knee Pain Descriptors / Indicators: Aching;Discomfort Pain Intervention(s): Limited activity within patient's tolerance;Monitored during session;Repositioned;Patient requesting pain meds-RN notified;Ice applied    Home Living Family/patient expects to be discharged to:: Private residence Living Arrangements: Spouse/significant other Available Help at Discharge: Family Type of Home: House Home Access: Stairs to enter Entrance Stairs-Rails: None Technical brewer of Steps: 1 Home Layout: One level Home Equipment: None      Prior Function Level of Independence: Independent               Hand Dominance   Dominant Hand: Right    Extremity/Trunk Assessment   Upper Extremity Assessment Upper Extremity Assessment: Overall WFL for tasks assessed    Lower Extremity Assessment Lower Extremity Assessment: RLE  deficits/detail RLE Deficits / Details: limited by pain , no extensor lag with SLR RLE: Unable to fully assess due to pain RLE Sensation: WNL RLE Coordination: WNL    Cervical / Trunk Assessment Cervical / Trunk Assessment: Normal  Communication   Communication: No difficulties  Cognition Arousal/Alertness: Awake/alert Behavior During Therapy:  WFL for tasks assessed/performed Overall Cognitive Status: Within Functional Limits for tasks assessed                                        General Comments      Exercises     Assessment/Plan    PT Assessment Patient needs continued PT services  PT Problem List Decreased strength;Decreased range of motion;Decreased activity tolerance;Decreased balance;Decreased mobility;Decreased knowledge of use of DME;Decreased knowledge of precautions;Pain       PT Treatment Interventions DME instruction;Gait training;Stair training;Functional mobility training;Therapeutic activities;Therapeutic exercise;Balance training;Patient/family education    PT Goals (Current goals can be found in the Care Plan section)  Acute Rehab PT Goals Patient Stated Goal: stop hurting PT Goal Formulation: With patient Time For Goal Achievement: 08/06/21 Potential to Achieve Goals: Good    Frequency 7X/week   Barriers to discharge        Co-evaluation               AM-PAC PT "6 Clicks" Mobility  Outcome Measure Help needed turning from your back to your side while in a flat bed without using bedrails?: A Little Help needed moving from lying on your back to sitting on the side of a flat bed without using bedrails?: A Little Help needed moving to and from a bed to a chair (including a wheelchair)?: A Little Help needed standing up from a chair using your arms (e.g., wheelchair or bedside chair)?: A Little Help needed to walk in hospital room?: A Lot Help needed climbing 3-5 steps with a railing? : A Lot 6 Click Score: 16    End of Session Equipment Utilized During Treatment: Gait belt Activity Tolerance: Patient tolerated treatment well Patient left: with call bell/phone within reach;in bed;with bed alarm set;with family/visitor present;with SCD's reapplied Nurse Communication: Mobility status;Patient requests pain meds PT Visit Diagnosis: Muscle weakness (generalized)  (M62.81);Difficulty in walking, not elsewhere classified (R26.2);Pain Pain - Right/Left: Right Pain - part of body: Knee    Time: 1729-1755 PT Time Calculation (min) (ACUTE ONLY): 26 min   Charges:   PT Evaluation $PT Eval Low Complexity: 1 Low PT Treatments $Therapeutic Activity: 8-22 mins        Verner Mould, DPT Acute Rehabilitation Services Office [phone] Pager [phone]   Jacques Navy 07/30/2021, 6:11 PM

## 2021-07-30 NOTE — Discharge Instructions (Signed)

## 2021-07-30 NOTE — Anesthesia Postprocedure Evaluation (Signed)
Anesthesia Post Note  Patient: Rodrickus Steig  Procedure(s) Performed: TOTAL KNEE ARTHROPLASTY (Right: Knee)     Patient location during evaluation: PACU Anesthesia Type: Spinal Level of consciousness: oriented and awake and alert Pain management: pain level controlled Vital Signs Assessment: post-procedure vital signs reviewed and stable Respiratory status: spontaneous breathing, respiratory function stable and patient connected to nasal cannula oxygen Cardiovascular status: blood pressure returned to baseline and stable Postop Assessment: no headache, no backache, no apparent nausea or vomiting and spinal receding Anesthetic complications: no   No notable events documented.  Last Vitals:  Vitals:   07/30/21 1330 07/30/21 1403  BP: 117/74 125/78  Pulse: 73 (!) 55  Resp: 16   Temp:  36.6 C  SpO2: 95% 97%            Effie Berkshire

## 2021-07-30 NOTE — Transfer of Care (Signed)
Immediate Anesthesia Transfer of Care Note  Patient: Darrell Conway  Procedure(s) Performed: TOTAL KNEE ARTHROPLASTY (Right: Knee)  Patient Location: PACU  Anesthesia Type:Regional and Spinal  Level of Consciousness: awake, alert  and oriented  Airway & Oxygen Therapy: Patient Spontanous Breathing and Patient connected to face mask oxygen  Post-op Assessment: Report given to RN and Post -op Vital signs reviewed and stable  Post vital signs: Reviewed and stable  Last Vitals:  Vitals Value Taken Time  BP 107/72 07/30/21 1245  Temp    Pulse 66 07/30/21 1245  Resp 18 07/30/21 1245  SpO2 93 % 07/30/21 1245  Vitals shown include unvalidated device data.  Last Pain:  Vitals:   07/30/21 0743  TempSrc: Oral  PainSc:       Patients Stated Pain Goal: 4 ([id number])  Complications: No notable events documented.

## 2021-07-30 NOTE — Progress Notes (Signed)
Assisted Dr. Suella Broad with Right Knee Adductor Canal block. Side rails up, monitors on throughout procedure. See vital signs in flow sheet. Tolerated Procedure well.

## 2021-07-30 NOTE — Interval H&P Note (Signed)
History and Physical Interval Note:  07/30/2021 8:34 AM  Darrell Conway  has presented today for surgery, with the diagnosis of Right knee osteoarthritis.  The various methods of treatment have been discussed with the patient and family. After consideration of risks, benefits and other options for treatment, the patient has consented to  Procedure(s): TOTAL KNEE ARTHROPLASTY (Right) as a surgical intervention.  The patient's history has been reviewed, patient examined, no change in status, stable for surgery.  I have reviewed the patient's chart and labs.  Questions were answered to the patient's satisfaction.     [name]

## 2021-07-30 NOTE — Anesthesia Procedure Notes (Signed)
Anesthesia Regional Block: Adductor canal block   Pre-Anesthetic Checklist: , timeout performed,  Correct Patient, Correct Site, Correct Laterality,  Correct Procedure, Correct Position, site marked,  Risks and benefits discussed,  Surgical consent,  Pre-op evaluation,  At surgeon's request and post-op pain management  Laterality: Right  Prep: chloraprep       Needles:  Injection technique: Single-shot  Needle Type: Echogenic Stimulator Needle     Needle Length: 9cm  Needle Gauge: 21     Additional Needles:   Procedures:,,,, ultrasound used (permanent image in chart),,    Narrative:  Start time: 07/30/2021 8:50 AM End time: 07/30/2021 8:55 AM Injection made incrementally with aspirations every 5 mL.  Performed by: Personally  Anesthesiologist: Effie Berkshire, MD  Additional Notes: Patient tolerated the procedure well. Local anesthetic introduced in an incremental fashion under minimal resistance after negative aspirations. No paresthesias were elicited. After completion of the procedure, no acute issues were identified and patient continued to be monitored by RN.

## 2021-07-30 NOTE — Anesthesia Preprocedure Evaluation (Addendum)
Anesthesia Evaluation  Patient identified by MRN, date of birth, ID band Patient awake    Reviewed: Allergy & Precautions, NPO status , Patient's Chart, lab work & pertinent test results  Airway Mallampati: II  TM Distance: >3 FB Neck ROM: Full    Dental  (+) Teeth Intact, Dental Advisory Given   Pulmonary sleep apnea , former smoker,    breath sounds clear to auscultation       Cardiovascular hypertension,  Rhythm:Regular Rate:Normal     Neuro/Psych negative neurological ROS  negative psych ROS   GI/Hepatic Neg liver ROS, GERD  ,  Endo/Other  negative endocrine ROS  Renal/GU negative Renal ROS     Musculoskeletal  (+) Arthritis , Rheumatoid disorders,    Abdominal Normal abdominal exam  (+)   Peds  Hematology negative hematology ROS (+)   Anesthesia Other Findings Swollen upper lip  Reproductive/Obstetrics                            Anesthesia Physical Anesthesia Plan  ASA: 3  Anesthesia Plan: Spinal   Post-op Pain Management:  Regional for Post-op pain   Induction: Intravenous  PONV Risk Score and Plan: 2 and Ondansetron, Propofol infusion and Midazolam  Airway Management Planned: Natural Airway and Simple Face Mask  Additional Equipment: None  Intra-op Plan:   Post-operative Plan:   Informed Consent:   Plan Discussed with: CRNA  Anesthesia Plan Comments:         Anesthesia Quick Evaluation

## 2021-07-30 NOTE — Op Note (Signed)
NAME:  Darrell Conway                      MEDICAL RECORD NO.:  [id number]                             FACILITY:  [facility]      PHYSICIAN:  Pietro Cassis. Alvan Dame, M.D.  DATE OF BIRTH:  [birth year]      DATE OF PROCEDURE:  07/30/2021                                     OPERATIVE REPORT         PREOPERATIVE DIAGNOSIS:  Right knee osteoarthritis.      POSTOPERATIVE DIAGNOSIS:  Right knee osteoarthritis.      FINDINGS:  The patient was noted to have complete loss of cartilage and   bone-on-bone arthritis with associated osteophytes in the medial and patellofemoral compartments of   the knee with a significant synovitis and associated effusion.  The patient had failed months of conservative treatment including medications, injection therapy, activity modification.     PROCEDURE:  Right total knee replacement.      COMPONENTS USED:  DePuy Attune rotating platform posterior stabilized knee   system, a size 6N femur, 6 tibia, size 5 mm PS AOX insert, and 38 anatomic patellar   button.      SURGEON:  Pietro Cassis. Alvan Dame, M.D.      ASSISTANT:  Costella Hatcher, PA-C.      ANESTHESIA:  Regional and Spinal.      SPECIMENS:  None.      COMPLICATION:  None.      DRAINS:  None.  EBL: <100 cc      TOURNIQUET TIME:   Total Tourniquet Time Documented: Thigh (Right) - 28 minutes Total: Thigh (Right) - 28 minutes  .      The patient was stable to the recovery room.      INDICATION FOR PROCEDURE:  Darrell Conway is a 55 y.o. male patient of   mine.  The patient had been seen, evaluated, and treated for months conservatively in the   office with medication, activity modification, and injections.  The patient had   radiographic changes of bone-on-bone arthritis with endplate sclerosis and osteophytes noted.  Based on the radiographic changes and failed conservative measures, the patient   decided to proceed with definitive treatment, total knee replacement.  Risks of infection, DVT, component failure, need  for revision surgery, neurovascular injury were reviewed in the office setting.  The postop course was reviewed stressing the efforts to maximize post-operative satisfaction and function.  Consent was obtained for benefit of pain   relief.      PROCEDURE IN DETAIL:  The patient was brought to the operative theater.   Once adequate anesthesia, preoperative antibiotics, 2 gm of Ancef,1 gm of Tranexamic Acid, and 10 mg of Decadron administered, the patient was positioned supine with a right thigh tourniquet placed.  The  right lower extremity was prepped and draped in sterile fashion.  A time-   out was performed identifying the patient, planned procedure, and the appropriate extremity.      The right lower extremity was placed in the [facility] leg holder.  The leg was   exsanguinated, tourniquet elevated to 250 mmHg.  A midline incision was   made followed  by median parapatellar arthrotomy.  Following initial   exposure, attention was first directed to the patella.  Precut   measurement was noted to be 26 mm.  I resected down to 14 mm and used a   38 anatomic patellar button to restore patellar height as well as cover the cut surface.      The lug holes were drilled and a metal shim was placed to protect the   patella from retractors and saw blade during the procedure.      At this point, attention was now directed to the femur.  The femoral   canal was opened with a drill, irrigated to try to prevent fat emboli.  An   intramedullary rod was passed at 5 degrees valgus, 9 mm of bone was   resected off the distal femur.  Following this resection, the tibia was   subluxated anteriorly.  Using the extramedullary guide, 2 mm of bone was resected off   the proximal medial tibia.  We confirmed the gap would be   stable medially and laterally with a size 5 spacer block as well as confirmed that the tibial cut was perpendicular in the coronal plane, checking with an alignment rod.      Once this was  done, I sized the femur to be a size 6 in the anterior-   posterior dimension, chose a narrow component based on medial and   lateral dimension.  The size 6 rotation block was then pinned in   position anterior referenced using the C-clamp to set rotation.  The   anterior, posterior, and  chamfer cuts were made without difficulty nor   notching making certain that I was along the anterior cortex to help   with flexion gap stability.      The final box cut was made off the lateral aspect of distal femur.      At this point, the tibia was sized to be a size 6.  The size 6 tray was   then pinned in position through the medial third of the tubercle,   drilled, and keel punched.  Trial reduction was now carried with a 6 femur,  6 tibia, a size 5 mm PS insert, and the 38 anatomic patella botton.  The knee was brought to full extension with good flexion stability with the patella   tracking through the trochlea without application of pressure.  Given   all these findings the trial components removed.  Final components were   opened and cement was mixed.  The knee was irrigated with normal saline solution and pulse lavage.  The synovial lining was   then injected with 30 cc of 0.25% Marcaine with epinephrine, 1 cc of Toradol and 30 cc of NS for a total of 61 cc.     Final implants were then cemented onto cleaned and dried cut surfaces of bone with the knee brought to extension with a size 5 mm PS trial insert.      Once the cement had fully cured, excess cement was removed   throughout the knee.  I confirmed that I was satisfied with the range of   motion and stability, and the final size 5 mm PS AOX insert was chosen.  It was   placed into the knee.      The tourniquet had been let down at 28 minutes.  No significant   hemostasis was required.  The extensor mechanism was then reapproximated using #1 Vicryl and #1  Stratafix sutures with the knee   in flexion.  The   remaining wound was closed  with 2-0 Vicryl and running 4-0 Monocryl.   The knee was cleaned, dried, dressed sterilely using Dermabond and   Aquacel dressing.  The patient was then   brought to recovery room in stable condition, tolerating the procedure   well.   Please note that Physician Assistant, Costella Hatcher, PA-C was present for the entirety of the case, and was utilized for pre-operative positioning, peri-operative retractor management, general facilitation of the procedure and for primary wound closure at the end of the case.              Pietro Cassis Alvan Dame, M.D.    07/30/2021 12:16 PM

## 2021-07-30 NOTE — Anesthesia Procedure Notes (Signed)
Spinal  Start time: 07/30/2021 10:58 AM End time: 07/30/2021 11:00 AM Reason for block: surgical anesthesia Staffing Performed: anesthesiologist  Anesthesiologist: Effie Berkshire, MD Preanesthetic Checklist Completed: patient identified, IV checked, site marked, risks and benefits discussed, surgical consent, monitors and equipment checked, pre-op evaluation and timeout performed Spinal Block Patient position: sitting Prep: DuraPrep and site prepped and draped Location: L3-4 Injection technique: single-shot Needle Needle type: Pencan  Needle gauge: 24 G Needle length: 10 cm Needle insertion depth: 10 cm Assessment Events: CSF return and second provider Additional Notes Patient tolerated well. No immediate complications.

## 2021-07-31 LAB — BASIC METABOLIC PANEL
Anion gap: 8 (ref 5–15)
BUN: 10 mg/dL (ref 6–20)
CO2: 24 mmol/L (ref 22–32)
Calcium: 9.1 mg/dL (ref 8.9–10.3)
Chloride: 102 mmol/L (ref 98–111)
Creatinine, Ser: 0.67 mg/dL (ref 0.61–1.24)
GFR, Estimated: >60 mL/min (ref >60)
Glucose, Bld: 140 mg/dL — ABNORMAL HIGH (ref 70–99)
Potassium: 3.9 mmol/L (ref 3.5–5.1)
Sodium: 134 mmol/L — ABNORMAL LOW (ref 135–145)

## 2021-07-31 LAB — CBC
HCT: 40 % (ref 39.0–52.0)
Hemoglobin: 13.7 g/dL (ref 13.0–17.0)
MCH: 29 pg (ref 26.0–34.0)
MCHC: 34.3 g/dL (ref 30.0–36.0)
MCV: 84.7 fL (ref 80.0–100.0)
Platelets: 211 10*3/uL (ref 150–400)
RBC: 4.72 MIL/uL (ref 4.22–5.81)
RDW: 12.9 % (ref 11.5–15.5)
WBC: 15.3 10*3/uL — ABNORMAL HIGH (ref 4.0–10.5)
nRBC: 0 % (ref 0.0–0.2)

## 2021-07-31 NOTE — Progress Notes (Signed)
Subjective: 1 Day Post-Op Procedure(s) (LRB): TOTAL KNEE ARTHROPLASTY (Right) Patient reports pain as mild.   Patient seen in rounds with Dr. Alvan Dame. Patient is resting in bed this morning with an ice pack on his forehead due to headache. He reports he normally does not get headaches. No acute events overnight. Foley catheter removed. Patient got out of bed with PT, but was limited by pain and nausea.  We will start therapy today.   Objective: Vital signs in last 24 hours: Temp:  [97.5 F (36.4 C)-98.4 F (36.9 C)] 98 F (36.7 C) (09/07 0534) Pulse Rate:  [55-100] 87 (09/07 0534) Resp:  [12-23] 17 (09/07 0534) BP: (96-147)/(63-92) 120/77 (09/07 0534) SpO2:  [92 %-97 %] 92 % (09/07 0534)  Intake/Output from previous day:  Intake/Output Summary (Last 24 hours) at 07/31/2021 0741 Last data filed at 07/31/2021 0534 Gross per 24 hour  Intake 3523 ml  Output 3325 ml  Net 198 ml     Intake/Output this shift: No intake/output data recorded.  Labs: Recent Labs    07/31/21 0322  HGB 13.7   Recent Labs    07/31/21 0322  WBC 15.3*  RBC 4.72  HCT 40.0  PLT 211   Recent Labs    07/31/21 0322  NA 134*  K 3.9  CL 102  CO2 24  BUN 10  CREATININE 0.67  GLUCOSE 140*  CALCIUM 9.1   No results for input(s): LABPT, INR in the last 72 hours.  Exam: General - Patient is Alert and Oriented Extremity - Neurologically intact Sensation intact distally Intact pulses distally Dorsiflexion/Plantar flexion intact Dressing - dressing C/D/I Motor Function - intact, moving foot and toes well on exam.   Past Medical History:  Diagnosis Date   Anemia    pt denies   Back pain    GERD (gastroesophageal reflux disease)    Gout    Heartburn    High cholesterol    Hip pain    Hypertension    Knee pain    Pneumonia    Rheumatoid arthritis (HCC)    Sleep apnea    no Cpap does not wear   Swallowing difficulty     Assessment/Plan: 1 Day Post-Op Procedure(s) (LRB): TOTAL KNEE  ARTHROPLASTY (Right) Active Problems:   S/P total knee arthroplasty, right  Estimated body mass index is 39.47 kg/m as calculated from the following:   Height as of this encounter: 6' (1.829 m).   Weight as of this encounter: 132 kg. Advance diet Up with therapy D/C IV fluids   Patient's anticipated LOS is less than 2 midnights, meeting these requirements: - Younger than 7 - Lives within 1 hour of care - Has a competent adult at home to recover with post-op recover - NO history of  - Chronic pain requiring opiods  - Diabetes  - Coronary Artery Disease  - Heart failure  - Heart attack  - Stroke  - DVT/VTE  - Cardiac arrhythmia  - Respiratory Failure/COPD  - Renal failure  - Anemia  - Advanced Liver disease     DVT Prophylaxis - Aspirin Weight bearing as tolerated.  Hgb stable at 13.7.   Plan is to go Home after hospital stay. Plan for possible discharge today following 1-2 sessions of PT if his headache resolves, and he is otherwise ready for discharge in terms of meeting PT goals and pain control.    Patient instructed to continue PO fluids, has one dose of toradol left to be given this  afternoon. Please let us know if headache is not improving.  Griffith Citron, PA-C Orthopedic Surgery [phone] 07/31/2021, 7:41 AM

## 2021-07-31 NOTE — Progress Notes (Addendum)
Physical Therapy Treatment Patient Details Name: Darrell Conway MRN: [id number] DOB: November 03, 1966 Today's Date: 07/31/2021    History of Present Illness Patient is 55 y.o. male s/p Rt TKA on 07/30/21 with PMH significant for anemia, back pain, GERD, gout, HTN, RA.    PT Comments    Progressing slowly with mobility. Pt c/o severe pain (10/10) with activity during this session. He reported HA was gone but he is having some lightheadedness. Will continue to follow. May benefit from remaining in hospital another night for pain control and to ensure safe mobility before discharging home.     Follow Up Recommendations  Follow surgeon's recommendation for DC plan and follow-up therapies     Equipment Recommendations  Rolling walker with 5" wheels;3in1 (PT) (wide RW and bsc)    Recommendations for Other Services       Precautions / Restrictions Precautions Precautions: Fall;Knee Restrictions Weight Bearing Restrictions: No Other Position/Activity Restrictions: WBAT    Mobility  Bed Mobility Overal bed mobility: Needs Assistance Bed Mobility: Supine to Sit     Supine to sit: Min assist     General bed mobility comments: Min A for L LE. Increased time. Pt c/o some lightheadedness that resolved enough to proceed with standing.    Transfers Overall transfer level: Needs assistance Equipment used: Rolling walker (2 wheeled) Transfers: Sit to/from Stand Sit to Stand: Min assist;From elevated surface         General transfer comment: Cues for safety, technique, hand/LE placement. Increased time. Assist to power up. C/o some lightheadedness that resolved enough to ambulate  Ambulation/Gait Ambulation/Gait assistance: Min guard;+2 safety/equipment Gait Distance (Feet): 25 Feet Assistive device: Rolling walker (2 wheeled) Gait Pattern/deviations: Step-to pattern;Antalgic;Decreased weight shift to right     General Gait Details: Cues for safety, technique, sequence. Min guard for  safety. Slow gait speed. Followed closely with recliner and use it to transport pt back to room. Ambulation distance limited primarily by pain, but pt also continued to c/o lightheadedness.   Stairs             Wheelchair Mobility    Modified Rankin (Stroke Patients Only)       Balance Overall balance assessment: Needs assistance         Standing balance support: Bilateral upper extremity supported Standing balance-Leahy Scale: Fair                              Cognition Arousal/Alertness: Awake/alert Behavior During Therapy: WFL for tasks assessed/performed Overall Cognitive Status: Within Functional Limits for tasks assessed                                        Exercises Total Joint Exercises Ankle Circles/Pumps: AROM;Both;10 reps Quad Sets: AROM;Right;5 reps Heel Slides: AAROM;Right;5 reps Hip ABduction/ADduction: AAROM;Right;5 reps Straight Leg Raises: AAROM;Right;5 reps Goniometric ROM: ~10-65 degrees    General Comments        Pertinent Vitals/Pain Pain Assessment: 0-10 Pain Score: 10-Worst pain ever Pain Location: R knee/thigh Pain Descriptors / Indicators: Discomfort;Sore;Aching;Tender Pain Intervention(s): Limited activity within patient's tolerance;Monitored during session;Ice applied;Repositioned    Home Living                      Prior Function            PT Goals (current goals can now  be found in the care plan section) Progress towards PT goals: Progressing toward goals    Frequency    7X/week      PT Plan Current plan remains appropriate    Co-evaluation              AM-PAC PT "6 Clicks" Mobility   Outcome Measure  Help needed turning from your back to your side while in a flat bed without using bedrails?: A Little Help needed moving from lying on your back to sitting on the side of a flat bed without using bedrails?: A Little Help needed moving to and from a bed to a chair  (including a wheelchair)?: A Little Help needed standing up from a chair using your arms (e.g., wheelchair or bedside chair)?: A Little Help needed to walk in hospital room?: A Little Help needed climbing 3-5 steps with a railing? : A Lot 6 Click Score: 17    End of Session Equipment Utilized During Treatment: Gait belt Activity Tolerance: Patient limited by pain Patient left: in chair;with call bell/phone within reach;with family/visitor present   PT Visit Diagnosis: Pain;Other abnormalities of gait and mobility (R26.89) Pain - Right/Left: Right Pain - part of body: Knee     Time: 1030-1056 PT Time Calculation (min) (ACUTE ONLY): 26 min  Charges:  $Gait Training: 8-22 mins $Therapeutic Exercise: 8-22 mins                         Doreatha Massed, PT Acute Rehabilitation  Office: [phone] Pager: [phone]

## 2021-07-31 NOTE — TOC Transition Note (Signed)
Transition of Care [facility]) - CM/SW Discharge Note   Patient Details  Name: Darrell Conway MRN: [id number] Date of Birth: 09-Feb-1966  Transition of Care [facility] CM/SW Contact:  Lennart Pall, LCSW Phone Number: 07/31/2021, 10:07 AM   Clinical Narrative:    Met with pt and confirming receipt of DME via [facility] (prearranged per MD office.)  Plan for OPPT at [facility].  No further TOC needs.   Final next level of care: OP Rehab Barriers to Discharge: No Barriers Identified   Patient Goals and CMS Choice Patient states their goals for this hospitalization and ongoing recovery are:: return home      Discharge Placement                       Discharge Plan and Services                DME Arranged: 3-N-1, Walker rolling DME Agency: [facility]                  Social Determinants of Health (SDOH) Interventions     Readmission Risk Interventions No flowsheet data found.

## 2021-07-31 NOTE — Plan of Care (Signed)
Plan of care reviewed and discussed with the patient and his wife. 

## 2021-07-31 NOTE — Progress Notes (Signed)
Physical Therapy Treatment Patient Details Name: Darrell Conway MRN: [id number] DOB: 21-Jul-1966 Today's Date: 07/31/2021    History of Present Illness Patient is 55 y.o. male s/p Rt TKA on 07/30/21 with PMH significant for anemia, back pain, GERD, gout, HTN, RA.    PT Comments    Progressing slowly. Moderate pain with activity this session. Pt continues to report mild headache. Feel pt will benefit from another night's stay to continue to work towards PT goals.    Follow Up Recommendations  Follow surgeon's recommendation for DC plan and follow-up therapies     Equipment Recommendations  Rolling walker with 5" wheels;3in1 (PT) (wide for both)    Recommendations for Other Services       Precautions / Restrictions Precautions Precautions: Fall;Knee Restrictions Weight Bearing Restrictions: No Other Position/Activity Restrictions: WBAT    Mobility  Bed Mobility Overal bed mobility: Needs Assistance Bed Mobility: Supine to Sit;Sit to Supine     Supine to sit: Min assist;HOB elevated Sit to supine: Min assist;HOB elevated   General bed mobility comments: Min A for L LE. Increased time.    Transfers Overall transfer level: Needs assistance Equipment used: Rolling walker (2 wheeled) Transfers: Sit to/from Stand Sit to Stand: Min assist;From elevated surface         General transfer comment: Cues for safety, technique, hand/LE placement. Increased time. Assist to power up.  Ambulation/Gait Ambulation/Gait assistance: Min guard Gait Distance (Feet): 50 Feet Assistive device: Rolling walker (2 wheeled) Gait Pattern/deviations: Step-to pattern;Antalgic     General Gait Details: Cues for safety, technique, sequence. Min guard for safety. Slow gait speed. Followed closely with recliner but did not have to use it this afternoon.  Ambulation distance limited primarily by pain, but pt also continues to c/o lightheadedness.   Stairs             Wheelchair Mobility     Modified Rankin (Stroke Patients Only)       Balance Overall balance assessment: Needs assistance         Standing balance support: Bilateral upper extremity supported Standing balance-Leahy Scale: Fair                              Cognition Arousal/Alertness: Awake/alert Behavior During Therapy: WFL for tasks assessed/performed Overall Cognitive Status: Within Functional Limits for tasks assessed                                        Exercises      General Comments        Pertinent Vitals/Pain Pain Assessment: 0-10 Pain Score: 8  Pain Location: R knee/thigh Pain Descriptors / Indicators: Discomfort;Sore;Aching;Tender Pain Intervention(s): Limited activity within patient's tolerance;Monitored during session;Ice applied;Repositioned    Home Living                      Prior Function            PT Goals (current goals can now be found in the care plan section) Progress towards PT goals: Progressing toward goals    Frequency    7X/week      PT Plan Current plan remains appropriate    Co-evaluation              AM-PAC PT "6 Clicks" Mobility   Outcome Measure  Help needed turning from  your back to your side while in a flat bed without using bedrails?: A Little Help needed moving from lying on your back to sitting on the side of a flat bed without using bedrails?: A Little Help needed moving to and from a bed to a chair (including a wheelchair)?: A Little Help needed standing up from a chair using your arms (e.g., wheelchair or bedside chair)?: A Little Help needed to walk in hospital room?: A Little Help needed climbing 3-5 steps with a railing? : A Little 6 Click Score: 18    End of Session Equipment Utilized During Treatment: Gait belt Activity Tolerance: Patient limited by pain Patient left: in bed;with call bell/phone within reach;with family/visitor present   PT Visit Diagnosis: Pain;Other  abnormalities of gait and mobility (R26.89) Pain - Right/Left: Right Pain - part of body: Knee     Time: [id number] PT Time Calculation (min) (ACUTE ONLY): 19 min  Charges:  $Gait Training: 8-22 mins                         Doreatha Massed, PT Acute Rehabilitation  Office: [phone] Pager: [phone]

## 2021-08-01 ENCOUNTER — Encounter (HOSPITAL_COMMUNITY): Payer: Self-pay | Admitting: Orthopedic Surgery

## 2021-08-01 LAB — CBC
HCT: 38 % — ABNORMAL LOW (ref 39.0–52.0)
Hemoglobin: 13 g/dL (ref 13.0–17.0)
MCH: 29.1 pg (ref 26.0–34.0)
MCHC: 34.2 g/dL (ref 30.0–36.0)
MCV: 85.2 fL (ref 80.0–100.0)
Platelets: 223 10*3/uL (ref 150–400)
RBC: 4.46 MIL/uL (ref 4.22–5.81)
RDW: 13.1 % (ref 11.5–15.5)
WBC: 16.9 10*3/uL — ABNORMAL HIGH (ref 4.0–10.5)
nRBC: 0 % (ref 0.0–0.2)

## 2021-08-01 MED ORDER — FLEET ENEMA 7-19 GM/118ML RE ENEM: 1.0000 | ENEMA | Freq: Every day | RECTAL | Status: DC | PRN

## 2021-08-01 NOTE — Progress Notes (Signed)
Physical Therapy Treatment Patient Details Name: Darrell Conway MRN: [id number] DOB: 23-Mar-1966 Today's Date: 08/01/2021    History of Present Illness Patient is 55 y.o. male s/p Rt TKA on 07/30/21 with PMH significant for anemia, back pain, GERD, gout, HTN, RA.    PT Comments    Pt ambulated 20' with RW, distance limited by lightheadedness and some nausea. Vital signs stable. Pt was not orthostatic (see flowsheets). He is not ready to DC home from a PT standpoint. Pt puts forth good effort and is motivated. Progress remains slow. He had decreased ambulation tolerance today compared to yesterday.    Follow Up Recommendations  Follow surgeon's recommendation for DC plan and follow-up therapies     Equipment Recommendations  Rolling walker with 5" wheels;3in1 (PT) (wide for both)    Recommendations for Other Services       Precautions / Restrictions Precautions Precautions: Fall;Knee Precaution Booklet Issued: Yes (comment) Precaution Comments: reviewed no pillow under knee with pt and spouse Restrictions Weight Bearing Restrictions: No Other Position/Activity Restrictions: WBAT    Mobility  Bed Mobility Overal bed mobility: Needs Assistance Bed Mobility: Sit to Supine     Supine to sit: Min assist;HOB elevated Sit to supine: Min assist;HOB elevated   General bed mobility comments: Min A for L LE. Increased time. Used rail    Transfers Overall transfer level: Needs assistance Equipment used: Rolling walker (2 wheeled) Transfers: Sit to/from Stand Sit to Stand: Min assist;From elevated surface         General transfer comment: Cues for safety, technique, hand/LE placement. Increased time. Assist to power up.  Ambulation/Gait Ambulation/Gait assistance: Min guard Gait Distance (Feet): 20 Feet Assistive device: Rolling walker (2 wheeled) Gait Pattern/deviations: Step-to pattern;Antalgic Gait velocity: decr   General Gait Details: Cues for safety, technique,  sequence. Min guard for safety. Slow gait speed. Followed closely with recliner. Distance limited by nausea, pain, lightheadedness. Vital signs stable (see flowsheets).   Stairs             Wheelchair Mobility    Modified Rankin (Stroke Patients Only)       Balance Overall balance assessment: Needs assistance Sitting-balance support: Feet supported;No upper extremity supported Sitting balance-Leahy Scale: Good     Standing balance support: Bilateral upper extremity supported Standing balance-Leahy Scale: Fair                              Cognition Arousal/Alertness: Awake/alert Behavior During Therapy: WFL for tasks assessed/performed Overall Cognitive Status: Within Functional Limits for tasks assessed                                        Exercises Total Joint Exercises Ankle Circles/Pumps: AROM;Both;10 reps Quad Sets: AROM;Both;10 reps Short Arc Quad: AAROM;15 reps;Supine Heel Slides: AAROM;Right;10 reps;Supine Hip ABduction/ADduction: AAROM;Right;10 reps Straight Leg Raises: AAROM;Right;10 reps;Supine Knee Flexion: AAROM;Right;15 reps;Seated Goniometric ROM: 5-50* AAROM R knee    General Comments        Pertinent Vitals/Pain Pain Score: 8  Pain Location: R knee/thigh Pain Descriptors / Indicators: Discomfort;Sore;Aching;Tender Pain Intervention(s): Limited activity within patient's tolerance;Monitored during session;Patient requesting pain meds-RN notified;Ice applied    Home Living                      Prior Function  PT Goals (current goals can now be found in the care plan section) Acute Rehab PT Goals Patient Stated Goal: stop hurting PT Goal Formulation: With patient/family Time For Goal Achievement: 08/06/21 Potential to Achieve Goals: Good Progress towards PT goals: Progressing toward goals    Frequency    7X/week      PT Plan Current plan remains appropriate    Co-evaluation               AM-PAC PT "6 Clicks" Mobility   Outcome Measure  Help needed turning from your back to your side while in a flat bed without using bedrails?: A Little Help needed moving from lying on your back to sitting on the side of a flat bed without using bedrails?: A Little Help needed moving to and from a bed to a chair (including a wheelchair)?: A Little Help needed standing up from a chair using your arms (e.g., wheelchair or bedside chair)?: A Little Help needed to walk in hospital room?: A Little Help needed climbing 3-5 steps with a railing? : A Lot 6 Click Score: 17    End of Session Equipment Utilized During Treatment: Gait belt Activity Tolerance: Patient limited by pain;Treatment limited secondary to medical complications (Comment) (nausea) Patient left: with call bell/phone within reach;in bed;with bed alarm set Nurse Communication: Mobility status;Other (comment) (pt lightheaded with walking) PT Visit Diagnosis: Pain;Other abnormalities of gait and mobility (R26.89) Pain - Right/Left: Right Pain - part of body: Knee     Time: [id number] PT Time Calculation (min) (ACUTE ONLY): 32 min  Charges:  $Gait Training: 8-22 mins $Therapeutic Exercise: 8-22 mins                     Blondell Reveal Kistler PT 08/01/2021  Acute Rehabilitation Services Pager [phone] Office [phone]

## 2021-08-01 NOTE — Progress Notes (Signed)
PA Costella Hatcher aware that pt did not pass therapy and that he was light headed and has a headache, and that his abdomen is distended with active bowel sounds. Prn order for an enema. Miralax given. Will continue to assess.

## 2021-08-01 NOTE — Plan of Care (Signed)
  Problem: Education: Goal: Knowledge of the prescribed therapeutic regimen will improve Outcome: Progressing   Problem: Activity: Goal: Range of joint motion will improve Outcome: Progressing   Problem: Pain Management: Goal: Pain level will decrease with appropriate interventions Outcome: Progressing   

## 2021-08-01 NOTE — Progress Notes (Signed)
Subjective: 2 Days Post-Op Procedure(s) (LRB): TOTAL KNEE ARTHROPLASTY (Right) Patient reports pain as mild.   Patient seen in rounds for Dr. Alvan Dame. Patient is well, and has had no acute complaints or problems. No acute events overnight. Voiding without difficulty. Patient reports he has headaches on and off, but is better than yesterday. He does note thigh soreness. Patient ambulated 50 feet with PT.  We will continue therapy today.   Objective: Vital signs in last 24 hours: Temp:  [98.2 F (36.8 C)-98.9 F (37.2 C)] 98.2 F (36.8 C) (09/08 1202) Pulse Rate:  [73-79] 73 (09/08 1202) Resp:  [16-17] 17 (09/08 0800) BP: (133-146)/(71-94) 146/94 (09/08 1202) SpO2:  [94 %-97 %] 97 % (09/08 1202)  Intake/Output from previous day:  Intake/Output Summary (Last 24 hours) at 08/01/2021 1250 Last data filed at 08/01/2021 0800 Gross per 24 hour  Intake 915.57 ml  Output --  Net 915.57 ml     Intake/Output this shift: Total I/O In: 240 [P.O.:240] Out: -   Labs: Recent Labs    07/31/21 0322 08/01/21 0314  HGB 13.7 13.0   Recent Labs    07/31/21 0322 08/01/21 0314  WBC 15.3* 16.9*  RBC 4.72 4.46  HCT 40.0 38.0*  PLT 211 223   Recent Labs    07/31/21 0322  NA 134*  K 3.9  CL 102  CO2 24  BUN 10  CREATININE 0.67  GLUCOSE 140*  CALCIUM 9.1   No results for input(s): LABPT, INR in the last 72 hours.  Exam: General - Patient is Alert and Oriented Extremity - Neurologically intact Sensation intact distally Intact pulses distally Dorsiflexion/Plantar flexion intact Dressing - dressing C/D/I Motor Function - intact, moving foot and toes well on exam.   Past Medical History:  Diagnosis Date   Anemia    pt denies   Back pain    GERD (gastroesophageal reflux disease)    Gout    Heartburn    High cholesterol    Hip pain    Hypertension    Knee pain    Pneumonia    Rheumatoid arthritis (HCC)    Sleep apnea    no Cpap does not wear   Swallowing difficulty      Assessment/Plan: 2 Days Post-Op Procedure(s) (LRB): TOTAL KNEE ARTHROPLASTY (Right) Active Problems:   S/P total knee arthroplasty, right  Estimated body mass index is 39.47 kg/m as calculated from the following:   Height as of this encounter: 6' (1.829 m).   Weight as of this encounter: 132 kg. Advance diet Up with therapy   Patient's anticipated LOS is less than 2 midnights, meeting these requirements: - Younger than 67 - Lives within 1 hour of care - Has a competent adult at home to recover with post-op recover - NO history of  - Chronic pain requiring opiods  - Diabetes  - Coronary Artery Disease  - Heart failure  - Heart attack  - Stroke  - DVT/VTE  - Cardiac arrhythmia  - Respiratory Failure/COPD  - Renal failure  - Anemia  - Advanced Liver disease     DVT Prophylaxis - Aspirin Weight bearing as tolerated.  Plan is to go Home after hospital stay. Patient's headache is improving. Possibly related to spinal anesthesia, but may also be due to post-surgery state such as new medications/dehydration.   Plan for discharge today following 1-2 sessions of PT if they are meeting their goals. Patient is scheduled for OPPT. Follow up in the office in 2  weeks.   Griffith Citron, PA-C Orthopedic Surgery [phone] 08/01/2021, 12:50 PM

## 2021-08-01 NOTE — Progress Notes (Signed)
Physical Therapy Treatment Patient Details Name: Darrell Conway MRN: [id number] DOB: 03/31/1966 Today's Date: 08/01/2021    History of Present Illness Patient is 55 y.o. male s/p Rt TKA on 07/30/21 with PMH significant for anemia, back pain, GERD, gout, HTN, RA.    PT Comments    Pt reports "12/10" R thigh pain. Decreased activity tolerance today, he ambulated 10' with RW, distance was limited by nausea and lightheadedness. Assisted pt to recliner where vitals were: 140/86, SaO2 97%, HR 72. Pt performed TKA HEP with min assist. Pt is not progressing with mobility. Nausea medication was requested, will attempt ambulation again this afternoon.     Follow Up Recommendations  Follow surgeon's recommendation for DC plan and follow-up therapies     Equipment Recommendations  Rolling walker with 5" wheels;3in1 (PT) (wide for both)    Recommendations for Other Services       Precautions / Restrictions Precautions Precautions: Fall;Knee Precaution Comments: reviewed no pillow under knee with pt and spouse Restrictions Weight Bearing Restrictions: No Other Position/Activity Restrictions: WBAT    Mobility  Bed Mobility Overal bed mobility: Needs Assistance Bed Mobility: Supine to Sit     Supine to sit: Min assist;HOB elevated     General bed mobility comments: Min A for L LE. Increased time. Used rail    Transfers Overall transfer level: Needs assistance Equipment used: Rolling walker (2 wheeled) Transfers: Sit to/from Stand Sit to Stand: Min assist;From elevated surface         General transfer comment: Cues for safety, technique, hand/LE placement. Increased time. Assist to power up.  Ambulation/Gait Ambulation/Gait assistance: Min guard Gait Distance (Feet): 10 Feet Assistive device: Rolling walker (2 wheeled) Gait Pattern/deviations: Step-to pattern;Antalgic Gait velocity: decr   General Gait Details: Cues for safety, technique, sequence. Min guard for safety. Slow  gait speed. Followed closely with recliner. Distance limited by nausea, pain, lightheadedness.   Stairs             Wheelchair Mobility    Modified Rankin (Stroke Patients Only)       Balance Overall balance assessment: Needs assistance Sitting-balance support: Feet supported;No upper extremity supported Sitting balance-Leahy Scale: Good     Standing balance support: Bilateral upper extremity supported Standing balance-Leahy Scale: Fair                              Cognition Arousal/Alertness: Awake/alert Behavior During Therapy: WFL for tasks assessed/performed Overall Cognitive Status: Within Functional Limits for tasks assessed                                        Exercises Total Joint Exercises Ankle Circles/Pumps: AROM;Both;10 reps Quad Sets: AROM;Both;10 reps Short Arc Quad: AAROM;15 reps;Supine Heel Slides: AAROM;Right;10 reps;Supine Hip ABduction/ADduction: AAROM;Right;10 reps Straight Leg Raises: AAROM;Right;10 reps;Supine    General Comments        Pertinent Vitals/Pain Pain Score: 10-Worst pain ever Pain Location: R knee/thigh Pain Descriptors / Indicators: Discomfort;Sore;Aching;Tender Pain Intervention(s): Limited activity within patient's tolerance;Monitored during session;Premedicated before session;Repositioned;Ice applied    Home Living                      Prior Function            PT Goals (current goals can now be found in the care plan section) Acute Rehab PT  Goals Patient Stated Goal: stop hurting PT Goal Formulation: With patient/family Time For Goal Achievement: 08/06/21 Potential to Achieve Goals: Good Progress towards PT goals: Not progressing toward goals - comment (pain/nausea limiting progress)    Frequency    7X/week      PT Plan Current plan remains appropriate    Co-evaluation              AM-PAC PT "6 Clicks" Mobility   Outcome Measure  Help needed turning  from your back to your side while in a flat bed without using bedrails?: A Little Help needed moving from lying on your back to sitting on the side of a flat bed without using bedrails?: A Little Help needed moving to and from a bed to a chair (including a wheelchair)?: A Little Help needed standing up from a chair using your arms (e.g., wheelchair or bedside chair)?: A Little Help needed to walk in hospital room?: A Little Help needed climbing 3-5 steps with a railing? : A Little 6 Click Score: 18    End of Session Equipment Utilized During Treatment: Gait belt Activity Tolerance: Patient limited by pain;Treatment limited secondary to medical complications (Comment) (nausea) Patient left: with call bell/phone within reach;with family/visitor present;in chair Nurse Communication: Mobility status;Other (comment) (pt requests nausea meds) PT Visit Diagnosis: Pain;Other abnormalities of gait and mobility (R26.89) Pain - Right/Left: Right Pain - part of body: Knee     Time: [id number] PT Time Calculation (min) (ACUTE ONLY): 29 min  Charges:  $Gait Training: 8-22 mins $Therapeutic Exercise: 8-22 mins                     Blondell Reveal Kistler PT 08/01/2021  Acute Rehabilitation Services Pager [phone] Office [phone]

## 2021-08-02 DIAGNOSIS — M1711 Unilateral primary osteoarthritis, right knee: Secondary | ICD-10-CM | POA: Diagnosis present

## 2021-08-02 DIAGNOSIS — Z8249 Family history of ischemic heart disease and other diseases of the circulatory system: Secondary | ICD-10-CM | POA: Diagnosis not present

## 2021-08-02 DIAGNOSIS — Z8261 Family history of arthritis: Secondary | ICD-10-CM | POA: Diagnosis not present

## 2021-08-02 DIAGNOSIS — Z6839 Body mass index (BMI) 39.0-39.9, adult: Secondary | ICD-10-CM | POA: Diagnosis not present

## 2021-08-02 DIAGNOSIS — E7800 Pure hypercholesterolemia, unspecified: Secondary | ICD-10-CM | POA: Diagnosis present

## 2021-08-02 DIAGNOSIS — M160 Bilateral primary osteoarthritis of hip: Secondary | ICD-10-CM | POA: Diagnosis present

## 2021-08-02 DIAGNOSIS — Z8616 Personal history of COVID-19: Secondary | ICD-10-CM | POA: Diagnosis not present

## 2021-08-02 DIAGNOSIS — Z87891 Personal history of nicotine dependence: Secondary | ICD-10-CM | POA: Diagnosis not present

## 2021-08-02 DIAGNOSIS — Z96659 Presence of unspecified artificial knee joint: Secondary | ICD-10-CM

## 2021-08-02 DIAGNOSIS — Z79899 Other long term (current) drug therapy: Secondary | ICD-10-CM | POA: Diagnosis not present

## 2021-08-02 DIAGNOSIS — G4733 Obstructive sleep apnea (adult) (pediatric): Secondary | ICD-10-CM | POA: Diagnosis present

## 2021-08-02 DIAGNOSIS — I10 Essential (primary) hypertension: Secondary | ICD-10-CM | POA: Diagnosis present

## 2021-08-02 DIAGNOSIS — K219 Gastro-esophageal reflux disease without esophagitis: Secondary | ICD-10-CM | POA: Diagnosis present

## 2021-08-02 DIAGNOSIS — M069 Rheumatoid arthritis, unspecified: Secondary | ICD-10-CM | POA: Diagnosis present

## 2021-08-02 MED ORDER — ASPIRIN 81 MG PO CHEW: 81.0000 mg | CHEWABLE_TABLET | Freq: Two times a day (BID) | ORAL | 0 refills | Status: AC

## 2021-08-02 MED ORDER — CELECOXIB 200 MG PO CAPS: 200.0000 mg | ORAL_CAPSULE | Freq: Two times a day (BID) | ORAL | 0 refills | Status: DC

## 2021-08-02 MED ORDER — POLYETHYLENE GLYCOL 3350 17 G PO PACK: 17.0000 g | PACK | Freq: Every day | ORAL | 0 refills | Status: DC | PRN

## 2021-08-02 MED ORDER — DOCUSATE SODIUM 100 MG PO CAPS
100.0000 mg | ORAL_CAPSULE | Freq: Two times a day (BID) | ORAL | 0 refills | Status: DC
Start: 2021-08-02 — End: 2022-11-25

## 2021-08-02 MED ORDER — METHOCARBAMOL 500 MG PO TABS: 500.0000 mg | ORAL_TABLET | Freq: Four times a day (QID) | ORAL | 0 refills | Status: DC | PRN

## 2021-08-02 MED ORDER — ACETAMINOPHEN 325 MG PO TABS: 325.0000 mg | ORAL_TABLET | Freq: Four times a day (QID) | ORAL | Status: DC | PRN

## 2021-08-02 MED ORDER — OXYCODONE HCL 5 MG PO TABS: 5.0000 mg | ORAL_TABLET | ORAL | 0 refills | Status: DC | PRN

## 2021-08-02 NOTE — Progress Notes (Signed)
Physical Therapy Treatment Patient Details Name: Darrell Conway MRN: [id number] DOB: November 11, 1966 Today's Date: 08/02/2021    History of Present Illness Patient is 55 y.o. male s/p Rt TKA on 07/30/21 with PMH significant for anemia, back pain, GERD, gout, HTN, RA.    PT Comments    POD # 3 pm session Assisted with amb a greater distance.  Tolerated well.  MAX c/o fatigue "poor sleep" and knee pain.  Pt stated he only has a "threshold" to enter his home so did not repeat one step.  Spouse is very helpful.  Pt has HEP handout and performed a few.  Addressed all mobility questions, discussed appropriate activity, educated on use of ICE.  Pt ready for D/C to home.   Follow Up Recommendations  Follow surgeon's recommendation for DC plan and follow-up therapies;Outpatient PT     Equipment Recommendations  Rolling walker with 5" wheels;3in1 (PT)    Recommendations for Other Services       Precautions / Restrictions Precautions Precautions: Fall;Knee Precaution Comments: reviewed no pillow under knee with pt and spouse Restrictions Weight Bearing Restrictions: No Other Position/Activity Restrictions: WBAT    Mobility  Bed Mobility   General bed mobility comments: OOB in recliner    Transfers Overall transfer level: Needs assistance Equipment used: Rolling walker (2 wheeled) Transfers: Sit to/from [facility] Sit to Stand: Supervision Stand pivot transfers: Supervision;Min guard       General transfer comment: 50% VC's to extend R LE out prior to sit to decrease stress/pain.  25% VC's on safety with turns.  Ambulation/Gait Ambulation/Gait assistance: Supervision Gait Distance (Feet): 55 Feet Assistive device: Rolling walker (2 wheeled) Gait Pattern/deviations: Step-to pattern;Antalgic;Decreased stance time - right Gait velocity: decreased   General Gait Details: 25% VC's on proper sequencing and proper walker to self distance.  Tolerated an increased distance.   Amb with spouse.  Mild c/o "light headed".   Stairs  Wheelchair Mobility    Modified Rankin (Stroke Patients Only)       Balance                                            Cognition Arousal/Alertness: Awake/alert Behavior During Therapy: WFL for tasks assessed/performed Overall Cognitive Status: Within Functional Limits for tasks assessed                                 General Comments: pleasant and motivated      Exercises  Total Knee Replacement TE's following HEP handout 10 reps B LE ankle pumps 05 reps towel squeezes 05 reps knee presses 05 reps heel slides   Educated on use of gait belt to assist with TE's Followed by ICE     General Comments        Pertinent Vitals/Pain Pain Assessment: 0-10 Pain Score: 8  Pain Location: R knee/thigh Pain Descriptors / Indicators: Discomfort;Sore;Aching;Tender Pain Intervention(s): Monitored during session;Premedicated before session;Repositioned;Ice applied;Heat applied    Home Living                      Prior Function            PT Goals (current goals can now be found in the care plan section)      Frequency    7X/week  PT Plan Current plan remains appropriate    Co-evaluation              AM-PAC PT "6 Clicks" Mobility   Outcome Measure  Help needed turning from your back to your side while in a flat bed without using bedrails?: A Little Help needed moving from lying on your back to sitting on the side of a flat bed without using bedrails?: A Little Help needed moving to and from a bed to a chair (including a wheelchair)?: A Little Help needed standing up from a chair using your arms (e.g., wheelchair or bedside chair)?: A Little Help needed to walk in hospital room?: A Little Help needed climbing 3-5 steps with a railing? : A Lot 6 Click Score: 17    End of Session Equipment Utilized During Treatment: Gait belt Activity Tolerance: Patient  tolerated treatment well Patient left: in bed;with call bell/phone within reach;with family/visitor present Nurse Communication: Mobility status PT Visit Diagnosis: Pain;Other abnormalities of gait and mobility (R26.89) Pain - Right/Left: Right Pain - part of body: Knee     Time: 1235-1300 PT Time Calculation (min) (ACUTE ONLY): 25 min  Charges:  $Gait Training: 8-22 mins $Therapeutic Exercise: 8-22 mins                     Rica Koyanagi  PTA Acute  Rehabilitation Services Pager      [phone] Office      [phone]

## 2021-08-02 NOTE — Progress Notes (Signed)
Subjective: 3 Days Post-Op Procedure(s) (LRB): TOTAL KNEE ARTHROPLASTY (Right) Patient reports pain as mild.   Patient seen in rounds for Dr. Alvan Dame. Patient is well, and has had no acute complaints or problems. No acute events overnight. Voiding without difficulty. He reports he passed a lot of gas yesterday, but has not had a BM. He denies abdominal pain. He reports his headaches are on and off, but improve with medication and topical ice. Patient ambulated 20 feet with PT. He states he wants to get home today.  We will continue therapy today.   Objective: Vital signs in last 24 hours: Temp:  [98.2 F (36.8 C)-99.1 F (37.3 C)] 98.7 F (37.1 C) (09/09 0641) Pulse Rate:  [73-102] 81 (09/09 0641) Resp:  [16-17] 16 (09/09 0641) BP: (120-146)/(84-94) 127/88 (09/09 0641) SpO2:  [92 %-97 %] 92 % (09/09 0641)  Intake/Output from previous day:  Intake/Output Summary (Last 24 hours) at 08/02/2021 0747 Last data filed at 08/02/2021 0607 Gross per 24 hour  Intake 600 ml  Output --  Net 600 ml     Intake/Output this shift: No intake/output data recorded.  Labs: Recent Labs    07/31/21 0322 08/01/21 0314  HGB 13.7 13.0   Recent Labs    07/31/21 0322 08/01/21 0314  WBC 15.3* 16.9*  RBC 4.72 4.46  HCT 40.0 38.0*  PLT 211 223   Recent Labs    07/31/21 0322  NA 134*  K 3.9  CL 102  CO2 24  BUN 10  CREATININE 0.67  GLUCOSE 140*  CALCIUM 9.1   No results for input(s): LABPT, INR in the last 72 hours.  Exam: General - Patient is Alert and Oriented Extremity - Neurologically intact Sensation intact distally Intact pulses distally Dorsiflexion/Plantar flexion intact Dressing - dressing C/D/I Motor Function - intact, moving foot and toes well on exam.   Past Medical History:  Diagnosis Date   Anemia    pt denies   Back pain    GERD (gastroesophageal reflux disease)    Gout    Heartburn    High cholesterol    Hip pain    Hypertension    Knee pain     Pneumonia    Rheumatoid arthritis (HCC)    Sleep apnea    no Cpap does not wear   Swallowing difficulty     Assessment/Plan: 3 Days Post-Op Procedure(s) (LRB): TOTAL KNEE ARTHROPLASTY (Right) Active Problems:   S/P total knee arthroplasty, right  Estimated body mass index is 39.47 kg/m as calculated from the following:   Height as of this encounter: 6' (1.829 m).   Weight as of this encounter: 132 kg. Advance diet Up with therapy D/C IV fluids  Anticipated LOS equal to or greater than 2 midnights due to - Age 65 and older with one or more of the following:  - Obesity  - Expected need for hospital services (PT, OT, Nursing) required for safe  discharge  - Anticipated need for postoperative skilled nursing care or inpatient rehab  - Active co-morbidities: Chronic pain requiring opiods OR   - Unanticipated findings during/Post Surgery: None  - Patient is a high risk of re-admission due to: None   DVT Prophylaxis - Aspirin Weight bearing as tolerated.  Plan is to go Home after hospital stay.   He was able to pass gas after fleets enema yesterday, will repeat today. Discussed with him that he should call us urgently if he develops abdominal pain, bloating, or nausea/vomiting.  His headaches are atypical for spinal related headache, as he reports they come and go. Regardless, there are many reasons including medication related that he may be having headaches here. He is not hypertensive. He reports relief with ice and pain medication, and feels it will be manageable at home.   Plan for discharge today following 1-2 sessions of PT as long as they are meeting their goals. Patient is scheduled for OPPT. Follow up in the office in 2 weeks.   Griffith Citron, PA-C Orthopedic Surgery [phone] 08/02/2021, 7:47 AM

## 2021-08-02 NOTE — Progress Notes (Signed)
Physical Therapy Treatment Patient Details Name: Darrell Conway MRN: [id number] DOB: 05/02/1966 Today's Date: 08/02/2021    History of Present Illness Patient is 55 y.o. male s/p Rt TKA on 07/30/21 with PMH significant for anemia, back pain, GERD, gout, HTN, RA.    PT Comments    POD #3 am session Assisted OOB to amb.  General bed mobility comments: demonstarted and instructed how to use a belt to self assist LE also spouse does well to assist.  General transfer comment: 50% VC's to extend R LE out prior to sit to decrease stress/pain.  25% VC's on safety with turns. General Gait Details: 25% VC's on proper sequencing and proper walker to self distance.  Tolerated an increased distance.  Amb with spouse.  Mild c/o "light headed". General stair comments: one step/curb with spopuse with 50% VC's on proper tech and sequencing. Pt will need another PT session to increase amb distance/tolerance prior to D/C to home.   Follow Up Recommendations  Follow surgeon's recommendation for DC plan and follow-up therapies;Outpatient PT     Equipment Recommendations  Rolling walker with 5" wheels;3in1 (PT)    Recommendations for Other Services       Precautions / Restrictions Precautions Precautions: Fall;Knee Precaution Comments: reviewed no pillow under knee with pt and spouse Restrictions Weight Bearing Restrictions: No Other Position/Activity Restrictions: WBAT    Mobility  Bed Mobility Overal bed mobility: Needs Assistance Bed Mobility: Supine to Sit     Supine to sit: Min assist     General bed mobility comments: demonstarted and instructed how to use a belt to self assist LE also spouse does well to assist    Transfers Overall transfer level: Needs assistance Equipment used: Rolling walker (2 wheeled) Transfers: Sit to/from [facility] Sit to Stand: Supervision Stand pivot transfers: Supervision;Min guard       General transfer comment: 50% VC's to extend R LE  out prior to sit to decrease stress/pain.  25% VC's on safety with turns.  Ambulation/Gait Ambulation/Gait assistance: Supervision Gait Distance (Feet): 40 Feet Assistive device: Rolling walker (2 wheeled) Gait Pattern/deviations: Step-to pattern;Antalgic;Decreased stance time - right Gait velocity: decreased   General Gait Details: 25% VC's on proper sequencing and proper walker to self distance.  Tolerated an increased distance.  Amb with spouse.  Mild c/o "light headed".   Stairs Stairs: Yes Stairs assistance: Min assist Stair Management: No rails;Step to pattern;Forwards;With walker Number of Stairs: 1 General stair comments: one step/curb with spopuse with 50% VC's on proper tech and sequencing   Wheelchair Mobility    Modified Rankin (Stroke Patients Only)       Balance                                            Cognition Arousal/Alertness: Awake/alert Behavior During Therapy: WFL for tasks assessed/performed Overall Cognitive Status: Within Functional Limits for tasks assessed                                 General Comments: pleasant and motivated      Exercises      General Comments        Pertinent Vitals/Pain Pain Assessment: 0-10 Pain Score: 8  Pain Location: R knee/thigh Pain Descriptors / Indicators: Discomfort;Sore;Aching;Tender Pain Intervention(s): Monitored during session;Premedicated before session;Repositioned;Ice applied;Heat  applied    Home Living                      Prior Function            PT Goals (current goals can now be found in the care plan section)      Frequency    7X/week      PT Plan Current plan remains appropriate    Co-evaluation              AM-PAC PT "6 Clicks" Mobility   Outcome Measure  Help needed turning from your back to your side while in a flat bed without using bedrails?: A Little Help needed moving from lying on your back to sitting on the  side of a flat bed without using bedrails?: A Little Help needed moving to and from a bed to a chair (including a wheelchair)?: A Little Help needed standing up from a chair using your arms (e.g., wheelchair or bedside chair)?: A Little Help needed to walk in hospital room?: A Little Help needed climbing 3-5 steps with a railing? : A Lot 6 Click Score: 17    End of Session Equipment Utilized During Treatment: Gait belt Activity Tolerance: Patient tolerated treatment well Patient left: in bed;with call bell/phone within reach;with family/visitor present Nurse Communication: Mobility status PT Visit Diagnosis: Pain;Other abnormalities of gait and mobility (R26.89) Pain - Right/Left: Right Pain - part of body: Knee     Time: 1100-1126 PT Time Calculation (min) (ACUTE ONLY): 26 min  Charges:  $Gait Training: 8-22 mins $Therapeutic Activity: 8-22 mins                     Rica Koyanagi  PTA Acute  Rehabilitation Services Pager      [phone] Office      [phone]

## 2021-08-02 NOTE — Plan of Care (Signed)
Pt ready to DC home with wife. 

## 2021-08-05 ENCOUNTER — Emergency Department (HOSPITAL_COMMUNITY)
Admission: EM | Admit: 2021-08-05 | Discharge: 2021-08-05 | Disposition: A | Discharge status: Home / Self Care | Payer: No Typology Code available for payment source | Attending: Emergency Medicine | Admitting: Emergency Medicine

## 2021-08-05 ENCOUNTER — Emergency Department (HOSPITAL_BASED_OUTPATIENT_CLINIC_OR_DEPARTMENT_OTHER): Payer: No Typology Code available for payment source

## 2021-08-05 ENCOUNTER — Telehealth: Payer: Self-pay

## 2021-08-05 ENCOUNTER — Other Ambulatory Visit: Payer: Self-pay

## 2021-08-05 ENCOUNTER — Encounter (HOSPITAL_COMMUNITY): Payer: Self-pay

## 2021-08-05 DIAGNOSIS — Z5321 Procedure and treatment not carried out due to patient leaving prior to being seen by health care provider: Secondary | ICD-10-CM | POA: Insufficient documentation

## 2021-08-05 DIAGNOSIS — M79604 Pain in right leg: Secondary | ICD-10-CM

## 2021-08-05 DIAGNOSIS — M79661 Pain in right lower leg: Secondary | ICD-10-CM | POA: Insufficient documentation

## 2021-08-05 DIAGNOSIS — Z96651 Presence of right artificial knee joint: Secondary | ICD-10-CM | POA: Diagnosis not present

## 2021-08-05 NOTE — Telephone Encounter (Signed)
Transition Care Management Follow-up Telephone Call Date of discharge and from where: 08/02/2021, [facility]  How have you been since you were released from the hospital? He said he is sore, hurting and has been taking his pain medication as instructed.  Any questions or concerns? Yes - he is concerned about his pain. He also thinks he may need a CPM machine and will discuss with orthopedic surgeon at his appointment this week.  He also noted that he is now allergic to lisinopril and would like to know if PCP wants to order another medication for him.  Lisinopril remains on his AVS but it is noted in Epic that he has an allergy to lisinopril.   Items Reviewed: Did the pt receive and understand the discharge instructions provided? Yes  Medications obtained and verified? Yes  - he said he has all medications.  Other? No  Any new allergies since your discharge? Yes  - lisinopril.  Dietary orders reviewed? No Do you have support at home? Yes   Home Care and Equipment/Supplies: Were home health services ordered? no If so, what is the name of the agency? N/a  Has the agency set up a time to come to the patient's home? not applicable Were any new equipment or medical supplies ordered?  Yes: RW and 3:1 commode.  What is the name of the medical supply agency? [employer] - this was ordered prior to his hospitalization Were you able to get the supplies/equipment? yes Do you have any questions related to the use of the equipment or supplies? No  He reported that the dressing remains intact on his right knee.    Functional Questionnaire: (I = Independent and D = Dependent) ADLs: independent. Using RW with ambulation. WBAT RLE    Follow up appointments reviewed:  PCP Hospital f/u appt confirmed? Yes  Scheduled to see Durene Fruits, NP on 09/30/2021 @ 1440. [facility] f/u appt confirmed? Yes  Scheduled to see orthopedics on 08/07/2021.  Are transportation arrangements needed? No  If  their condition worsens, is the pt aware to call PCP or go to the Emergency Dept.? Yes Was the patient provided with contact information for the PCP's office or ED? Yes Was to pt encouraged to call back with questions or concerns? Yes

## 2021-08-05 NOTE — ED Provider Notes (Signed)
Emergency Medicine Provider Triage Evaluation Note  Darrell Conway , a 55 y.o. male  was evaluated in triage.  Pt complains of right calf pain.  Patient states he had a total knee replacement 6 days ago.  Today he developed increased pain in his calf, and he was told to come to the ER.  He denies any fall or injury.  No chest pain.  States he is breathing heavier than normal.  Review of Systems  Positive: R calf pain Negative: fever  Physical Exam  BP (!) 141/94 (BP Location: Left Arm)   Pulse (!) 106   Temp 98 F (36.7 C) (Oral)   Resp 16   Ht 6' (1.829 m)   Wt 132 kg   SpO2 97%   BMI 39.47 kg/m  Gen:   Awake, no distress   Resp:  Normal effort  MSK:   Moves extremities without difficulty  Other:  Ttp of R calf.   Medical Decision Making  Medically screening exam initiated at 6:16 PM.  Appropriate orders placed.  Darrell Conway was informed that the remainder of the evaluation will be completed by another provider, this initial triage assessment does not replace that evaluation, and the importance of remaining in the ED until their evaluation is complete.  [place] and blood work ordered   Darrell [place], PA-C 08/05/21 Darrell Conway, Darrell [place], MD 08/05/21 [phone]

## 2021-08-05 NOTE — ED Triage Notes (Signed)
Patient reports that he had right total knee replacement 6 days ago. Patient states he was told if he felt any knots on the back of his calf to come to the ED. Patient states he felt 2 knots on the back of his calf.

## 2021-08-05 NOTE — Discharge Summary (Signed)
Physician Discharge Summary   Patient ID: Darrell Conway MRN: [id number] DOB/AGE: [id number] y.o.  Admit date: 07/30/2021 Discharge date: 08/02/2021  Primary Diagnosis: Right knee osteoarthritis.  Admission Diagnoses:  Past Medical History:  Diagnosis Date   Anemia    pt denies   Back pain    GERD (gastroesophageal reflux disease)    Gout    Heartburn    High cholesterol    Hip pain    Hypertension    Knee pain    Pneumonia    Rheumatoid arthritis ([place])    Sleep apnea    no Cpap does not wear   Swallowing difficulty    Discharge Diagnoses:   Active Problems:   S/P total knee arthroplasty, right   S/P total knee arthroplasty  Estimated body mass index is 39.47 kg/m as calculated from the following:   Height as of this encounter: 6' (1.829 m).   Weight as of this encounter: 132 kg.  Procedure:  Procedure(s) (LRB): TOTAL KNEE ARTHROPLASTY (Right)   Consults: None  HPI:  Darrell Conway is a 55 y.o. male patient of   mine.  The patient had been seen, evaluated, and treated for months conservatively in the   office with medication, activity modification, and injections.  The patient had   radiographic changes of bone-on-bone arthritis with endplate sclerosis and osteophytes noted.  Based on the radiographic changes and failed conservative measures, the patient   decided to proceed with definitive treatment, total knee replacement.  Risks of infection, DVT, component failure, need for revision surgery, neurovascular injury were reviewed in the office setting.  The postop course was reviewed stressing the efforts to maximize post-operative satisfaction and function.  Consent was obtained for benefit of pain   relief.   Laboratory Data: Admission on 07/30/2021, Discharged on 08/02/2021  Component Date Value Ref Range Status   SARS Coronavirus 2 07/30/2021 NEGATIVE  NEGATIVE Final   Comment: (NOTE) SARS-CoV-2 target nucleic acids are NOT DETECTED.  The SARS-CoV-2 RNA is  generally detectable in upper and lower respiratory specimens during the acute phase of infection. The lowest concentration of SARS-CoV-2 viral copies this assay can detect is 250 copies / mL. A negative result does not preclude SARS-CoV-2 infection and should not be used as the sole basis for treatment or other patient management decisions.  A negative result may occur with improper specimen collection / handling, submission of specimen other than nasopharyngeal swab, presence of viral mutation(s) within the areas targeted by this assay, and inadequate number of viral copies (<250 copies / mL). A negative result must be combined with clinical observations, patient history, and epidemiological information.  Fact Sheet for Patients:   StrictlyIdeas.no  Fact Sheet for Healthcare Providers: BankingDealers.co.za  This test is not yet approved or                           cleared by the [place] FDA and has been authorized for detection and/or diagnosis of SARS-CoV-2 by FDA under an Emergency Use Authorization (EUA).  This EUA will remain in effect (meaning this test can be used) for the duration of the COVID-19 declaration under Section 564(b)(1) of the Act, 21 U.S.C. section 360bbb-3(b)(1), unless the authorization is terminated or revoked sooner.  Performed at [facility], [place] [address] [place], [place] [address]    WBC 07/31/2021 15.3 (A) 4.0 - 10.5 K/uL Final   RBC 07/31/2021 4.72  4.22 - 5.81 MIL/uL  Final   Hemoglobin 07/31/2021 13.7  13.0 - 17.0 g/dL Final   HCT 07/31/2021 40.0  39.0 - 52.0 % Final   MCV 07/31/2021 84.7  80.0 - 100.0 fL Final   MCH 07/31/2021 29.0  26.0 - 34.0 pg Final   MCHC 07/31/2021 34.3  30.0 - 36.0 g/dL Final   RDW 07/31/2021 12.9  11.5 - 15.5 % Final   Platelets 07/31/2021 211  150 - 400 K/uL Final   nRBC 07/31/2021 0.0  0.0 - 0.2 % Final   Performed at [facility], [place] [address], [place], [place] [address]   Sodium 07/31/2021 134 (A) 135 - 145 mmol/L Final   Potassium 07/31/2021 3.9  3.5 - 5.1 mmol/L Final   Chloride 07/31/2021 102  98 - 111 mmol/L Final   CO2 07/31/2021 24  22 - 32 mmol/L Final   Glucose, Bld 07/31/2021 140 (A) 70 - 99 mg/dL Final   Glucose reference range applies only to samples taken after fasting for at least 8 hours.   BUN 07/31/2021 10  6 - 20 mg/dL Final   Creatinine, Ser 07/31/2021 0.67  0.61 - 1.24 mg/dL Final   Calcium 07/31/2021 9.1  8.9 - 10.3 mg/dL Final   GFR, Estimated 07/31/2021 >60  >60 mL/min Final   Comment: (NOTE) Calculated using the CKD-EPI Creatinine Equation (2021)    Anion gap 07/31/2021 8  5 - 15 Final   Performed at [facility], [place] [address], [place], [place] [address]   WBC 08/01/2021 16.9 (A) 4.0 - 10.5 K/uL Final   RBC 08/01/2021 4.46  4.22 - 5.81 MIL/uL Final   Hemoglobin 08/01/2021 13.0  13.0 - 17.0 g/dL Final   HCT 08/01/2021 38.0 (A) 39.0 - 52.0 % Final   MCV 08/01/2021 85.2  80.0 - 100.0 fL Final   MCH 08/01/2021 29.1  26.0 - 34.0 pg Final   MCHC 08/01/2021 34.2  30.0 - 36.0 g/dL Final   RDW 08/01/2021 13.1  11.5 - 15.5 % Final   Platelets 08/01/2021 223  150 - 400 K/uL Final   nRBC 08/01/2021 0.0  0.0 - 0.2 % Final   Performed at [facility], [place] [address], [place], [place] [address]  Hospital Outpatient Visit on 07/18/2021  Component Date Value Ref Range Status   MRSA, PCR 07/18/2021 NEGATIVE  NEGATIVE Final   Staphylococcus aureus 07/18/2021 NEGATIVE  NEGATIVE Final   Comment: (NOTE) The Xpert SA Assay (FDA approved for NASAL specimens in patients 64 years of age and older), is one component of a comprehensive surveillance program. It is not intended to diagnose infection nor to guide or monitor treatment. Performed at [facility], [place] [address] [place], [place] [address]    WBC 07/18/2021 6.8  4.0 - 10.5  K/uL Final   RBC 07/18/2021 5.10  4.22 - 5.81 MIL/uL Final   Hemoglobin 07/18/2021 14.7  13.0 - 17.0 g/dL Final   HCT 07/18/2021 44.8  39.0 - 52.0 % Final   MCV 07/18/2021 87.8  80.0 - 100.0 fL Final   MCH 07/18/2021 28.8  26.0 - 34.0 pg Final   MCHC 07/18/2021 32.8  30.0 - 36.0 g/dL Final   RDW 07/18/2021 13.2  11.5 - 15.5 % Final   Platelets 07/18/2021 241  150 - 400 K/uL Final   nRBC 07/18/2021 0.0  0.0 - 0.2 % Final   Performed at [name], [place] [address] [place], [place] [address]  Sodium 07/18/2021 137  135 - 145 mmol/L Final   Potassium 07/18/2021 4.0  3.5 - 5.1 mmol/L Final   Chloride 07/18/2021 107  98 - 111 mmol/L Final   CO2 07/18/2021 24  22 - 32 mmol/L Final   Glucose, Bld 07/18/2021 97  70 - 99 mg/dL Final   Glucose reference range applies only to samples taken after fasting for at least 8 hours.   BUN 07/18/2021 12  6 - 20 mg/dL Final   Creatinine, Ser 07/18/2021 0.76  0.61 - 1.24 mg/dL Final   Calcium 07/18/2021 9.5  8.9 - 10.3 mg/dL Final   Total Protein 07/18/2021 8.0  6.5 - 8.1 g/dL Final   Albumin 07/18/2021 4.4  3.5 - 5.0 g/dL Final   AST 07/18/2021 32  15 - 41 U/L Final   ALT 07/18/2021 58 (A) 0 - 44 U/L Final   Alkaline Phosphatase 07/18/2021 77  38 - 126 U/L Final   Total Bilirubin 07/18/2021 0.8  0.3 - 1.2 mg/dL Final   GFR, Estimated 07/18/2021 >60  >60 mL/min Final   Comment: (NOTE) Calculated using the CKD-EPI Creatinine Equation (2021)    Anion gap 07/18/2021 6  5 - 15 Final   Performed at [facility], [place] [address] [place], [place] [address]   ABO/RH(D) 07/18/2021 O POS   Final   Antibody Screen 07/18/2021 NEG   Final   Sample Expiration 07/18/2021 08/01/2021,2359   Final   Extend sample reason 07/18/2021    Final                   Value:NO TRANSFUSIONS OR PREGNANCY IN THE PAST 3 MONTHS Performed at [facility], [place] [address], [place], [place] [address]      X-Rays:No results  found.  EKG: Orders placed or performed in visit on 12/31/20   EKG 12-Lead     Hospital Course: Darrell Conway is a 55 y.o. who was admitted to [facility]. They were brought to the operating room on 07/30/2021 and underwent Procedure(s): TOTAL KNEE ARTHROPLASTY.  Patient tolerated the procedure well and was later transferred to the recovery room and then to the orthopaedic floor for postoperative care. They were given PO and IV analgesics for pain control following their surgery. They were given 24 hours of postoperative antibiotics of  Anti-infectives (From admission, onward)    Start     Dose/Rate Route Frequency Ordered Stop   07/30/21 1800  ceFAZolin (ANCEF) IVPB 2g/100 mL premix        2 g 200 mL/hr over 30 Minutes Intravenous Every 6 hours 07/30/21 1358 07/31/21 1654   07/30/21 0730  ceFAZolin (ANCEF) IVPB 3g/100 mL premix        3 g 200 mL/hr over 30 Minutes Intravenous On call to O.R. 07/30/21 0720 07/30/21 1103      and started on DVT prophylaxis in the form of Aspirin.   PT and OT were ordered for total joint protocol. Discharge planning consulted to help with postop disposition and equipment needs. Patient had a good night on the evening of surgery. They started to get up OOB with therapy on POD #0. Patient seen in rounds on POD #1 and reported a headache, which improved with pain medication and ice.  Continued to work with therapy into POD #2, and was making progress with PT. Pt was seen during rounds on day two and was ready to go home pending progress with therapy. Pt worked with therapy for  two additional sessions and was meeting their goals. He was discharged to home later that day in stable condition.  Diet: Regular diet Activity: WBAT Follow-up: in 2 weeks Disposition: Home Discharged Condition: good   Discharge Instructions     Call MD / Call 911   Complete by: As directed    If you experience chest pain or shortness of breath, CALL 911 and be transported  to the hospital emergency room.  If you develope a fever above 101 F, pus (white drainage) or increased drainage or redness at the wound, or calf pain, call your surgeon's office.   Change dressing   Complete by: As directed    Maintain surgical dressing until follow up in the clinic. If the edges start to pull up, may reinforce with tape. If the dressing is no longer working, may remove and cover with gauze and tape, but must keep the area dry and clean.  Call with any questions or concerns.   Constipation Prevention   Complete by: As directed    Drink plenty of fluids.  Prune juice may be helpful.  You may use a stool softener, such as Colace (over the counter) 100 mg twice a day.  Use MiraLax (over the counter) for constipation as needed.   Diet - low sodium heart healthy   Complete by: As directed    Increase activity slowly as tolerated   Complete by: As directed    Weight bearing as tolerated with assist device (walker, cane, etc) as directed, use it as long as suggested by your surgeon or therapist, typically at least 4-6 weeks.   Post-operative opioid taper instructions:   Complete by: As directed    POST-OPERATIVE OPIOID TAPER INSTRUCTIONS: It is important to wean off of your opioid medication as soon as possible. If you do not need pain medication after your surgery it is ok to stop day one. Opioids include: Codeine, Hydrocodone(Norco, Vicodin), Oxycodone(Percocet, oxycontin) and hydromorphone amongst others.  Long term and even short term use of opiods can cause: Increased pain response Dependence Constipation Depression Respiratory depression And more.  Withdrawal symptoms can include Flu like symptoms Nausea, vomiting And more Techniques to manage these symptoms Hydrate well Eat regular healthy meals Stay active Use relaxation techniques(deep breathing, meditating, yoga) Do Not substitute Alcohol to help with tapering If you have been on opioids for less than two weeks  and do not have pain than it is ok to stop all together.  Plan to wean off of opioids This plan should start within one week post op of your joint replacement. Maintain the same interval or time between taking each dose and first decrease the dose.  Cut the total daily intake of opioids by one tablet each day Next start to increase the time between doses. The last dose that should be eliminated is the evening dose.      TED hose   Complete by: As directed    Use stockings (TED hose) for 2 weeks on both leg(s).  You may remove them at night for sleeping.      Allergies as of 08/02/2021       Reactions   Lisinopril Swelling   Still taking the medication at home.  Pt reports getting welts on skin around sides/stomach and elsewhere.  He also reports mild lip swell.        Medication List     STOP taking these medications    HYDROcodone-acetaminophen 5-325 MG tablet Commonly known as: NORCO/VICODIN  lisinopril 20 MG tablet Commonly known as: ZESTRIL       TAKE these medications    acetaminophen 325 MG tablet Commonly known as: TYLENOL Take 1-2 tablets (325-650 mg total) by mouth every 6 (six) hours as needed for mild pain (pain score 1-3 or temp > 100.5).   amLODipine 10 MG tablet Commonly known as: NORVASC Take 1 tablet (10 mg total) by mouth daily.   aspirin 81 MG chewable tablet Chew 1 tablet (81 mg total) by mouth 2 (two) times daily for 28 days.   atorvastatin 20 MG tablet Commonly known as: LIPITOR Take 1 tablet (20 mg total) by mouth daily.   celecoxib 200 MG capsule Commonly known as: CELEBREX Take 1 capsule (200 mg total) by mouth 2 (two) times daily.   docusate sodium 100 MG capsule Commonly known as: COLACE Take 1 capsule (100 mg total) by mouth 2 (two) times daily.   methocarbamol 500 MG tablet Commonly known as: ROBAXIN Take 1 tablet (500 mg total) by mouth every 6 (six) hours as needed for muscle spasms.   multivitamin capsule Take 1 capsule  by mouth daily.   oxyCODONE 5 MG immediate release tablet Commonly known as: Oxy IR/ROXICODONE Take 1-2 tablets (5-10 mg total) by mouth every 4 (four) hours as needed for severe pain.   pantoprazole 40 MG tablet Commonly known as: PROTONIX Take 1 tablet (40 mg total) by mouth 2 (two) times daily before a meal. What changed:  when to take this reasons to take this   polyethylene glycol 17 g packet Commonly known as: MIRALAX / GLYCOLAX Take 17 g by mouth daily as needed for mild constipation.               Discharge Care Instructions  (From admission, onward)           Start     Ordered   08/02/21 0000  Change dressing       Comments: Maintain surgical dressing until follow up in the clinic. If the edges start to pull up, may reinforce with tape. If the dressing is no longer working, may remove and cover with gauze and tape, but must keep the area dry and clean.  Call with any questions or concerns.   08/02/21 1414            Follow-up Information     Paralee Cancel, MD. Schedule an appointment as soon as possible for a visit in 2 week(s).   Specialty: Orthopedic Surgery Contact information: [address] [place] [address] [phone]                 Signed: Griffith Citron, PA-C Orthopedic Surgery 08/05/2021, 1:08 PM

## 2021-08-05 NOTE — Progress Notes (Signed)
Lower extremity venous has been completed.   Preliminary results in CV Proc.   Darrell Conway 08/05/2021 7:06 PM

## 2021-09-26 ENCOUNTER — Other Ambulatory Visit: Payer: Self-pay

## 2021-09-26 ENCOUNTER — Telehealth: Payer: Self-pay | Admitting: Internal Medicine

## 2021-09-26 ENCOUNTER — Ambulatory Visit: Payer: No Typology Code available for payment source

## 2021-09-26 NOTE — Telephone Encounter (Signed)
1) Medication(s) Requested (by name):  pantoprazole (PROTONIX) 40 MG tablet   2) Pharmacy of Choice:  [facility] [address]   3) Special Requests:   Approved medications will be sent to the pharmacy, we will reach out if there is an issue.  Requests made after 3pm may not be addressed until the following business day!  If a patient is unsure of the name of the medication(s) please note and ask patient to call back when they are able to provide all info, do not send to responsible party until all information is available!

## 2021-09-27 ENCOUNTER — Other Ambulatory Visit: Payer: Self-pay | Admitting: Nurse Practitioner

## 2021-09-27 MED ORDER — PANTOPRAZOLE SODIUM 40 MG PO TBEC: 40.0000 mg | DELAYED_RELEASE_TABLET | Freq: Two times a day (BID) | ORAL | 0 refills | Status: DC

## 2021-09-27 NOTE — Progress Notes (Signed)
TRANSITION OF CARE VISIT   Primary Care Provider (PCP):    Durene Fruits, NP                                                      [facility] at [facility] [address] [place],  [place]  [address] Phone: [phone] Fax: [phone]     Date of Admission: 07/30/2021  Date of Discharge: 08/02/2021  Transitions of Care Call: 08/05/2021  Discharged from: [facility]   Discharge Diagnosis: Right knee osteoarthritis  Summary of Admission:  Hospital Course: Ladarious Kresse is a 55 y.o. who was admitted to [facility]. They were brought to the operating room on 07/30/2021 and underwent Procedure(s): TOTAL KNEE ARTHROPLASTY.  Patient tolerated the procedure well and was later transferred to the recovery room and then to the orthopaedic floor for postoperative care. They were given PO and IV analgesics for pain control following their surgery. They were given 24 hours of postoperative antibiotics of  Anti-infectives (From admission, onward)        Start     Dose/Rate Route Frequency Ordered Stop    07/30/21 1800   ceFAZolin (ANCEF) IVPB 2g/100 mL premix        2 g 200 mL/hr over 30 Minutes Intravenous Every 6 hours 07/30/21 1358 07/31/21 1654    07/30/21 0730   ceFAZolin (ANCEF) IVPB 3g/100 mL premix        3 g 200 mL/hr over 30 Minutes Intravenous On call to O.R. 07/30/21 0720 07/30/21 1103          and started on DVT prophylaxis in the form of Aspirin.   PT and OT were ordered for total joint protocol. Discharge planning consulted to help with postop disposition and equipment needs. Patient had a good night on the evening of surgery. They started to get up OOB with therapy on POD #0. Patient seen in rounds on POD #1 and reported a headache, which improved with pain medication and ice.  Continued to work with therapy into POD #2, and was making progress with PT. Pt was seen during rounds on  day two and was ready to go home pending progress with therapy. Pt worked with therapy for two additional sessions and was meeting their goals. He was discharged to home later that day in stable condition.   Diet: Regular diet Activity: WBAT Follow-up: in 2 weeks Disposition: Home Discharged Condition: good    TODAY's VISIT RIGHT TOTAL KNEE FOLLOW-UP: Today doing well. Still having some right knee pain. Followed by Orthopedics and next appointment within the next 1 week. No issues/concerns today.  2. HYPERTENSION FOLLOW-UP: During hospital admission for right total knee was determined that he is allergic to Lisinopril, no longer taking. Still taking Amlodipine as prescribed.  Med Adherence: [x]  Yes    []  No Medication side effects: []  Yes    [x]  No Adherence with salt restriction (low-salt diet): []  Yes  [x]  No Exercise: Yes [x]  No []  Home Monitoring?: []  Yes    [x]  No Monitoring Frequency: []  Yes    [x]  No Home BP results range: []  Yes    [x]  No Smoking []  Yes [x]  No SOB? []  Yes    [x]  No Chest Pain?: []  Yes    [x]  No  3. HISTORY  OF ANAPHYLAXIS: Reports primarily during childhood. Unsure of what trigger was at that time. Was taking allergy medications for some time and then stopped. Sometimes has hives or welts, randomly, unsure of trigger. Requesting Epi Pen.   MEDICATIONS  Medication Reconciliation conducted with patient/caregiver? (Yes/ No):Yes  New medications prescribed/discontinued upon discharge? (Yes/No): Yes  Barriers identified related to medications: No   LABS  Lab Reviewed (Yes/No/NA): Yes  PHYSICAL EXAM:  Physical Exam HENT:     Head: Normocephalic and atraumatic.  Eyes:     Extraocular Movements: Extraocular movements intact.     Conjunctiva/sclera: Conjunctivae normal.     Pupils: Pupils are equal, round, and reactive to light.  Cardiovascular:     Rate and Rhythm: Normal rate and regular rhythm.     Pulses: Normal pulses.     Heart sounds: Normal  heart sounds.  Pulmonary:     Effort: Pulmonary effort is normal.     Breath sounds: Normal breath sounds.  Musculoskeletal:     Cervical back: Normal range of motion.  Neurological:     General: No focal deficit present.     Mental Status: He is alert and oriented to person, place, and time.  Psychiatric:        Mood and Affect: Mood normal.        Behavior: Behavior normal.    ASSESSMENT: 1. Hospital discharge follow-up: - Reviewed hospital course, current medications, ensured proper follow-up in place, and addressed concerns.   2. S/P total knee arthroplasty, right: - Keep all scheduled appointments with Orthopedics.   3. Essential hypertension: - Continue Amlodipine as prescribed.  - Lisinopril discontinued at hospital discharge 08/02/2021 related to side effects of edema.  - Counseled on blood pressure goal of less than 130/80, low-sodium, DASH diet, medication compliance, 150 minutes of moderate intensity exercise per week as tolerated. Discussed medication compliance, adverse effects. - BMP to evaluate kidney function and electrolyte balance. - Follow-up with primary provider in 3 months or sooner if needed.  - Basic Metabolic Panel - amLODipine (NORVASC) 10 MG tablet; Take 1 tablet (10 mg total) by mouth daily.  Dispense: 90 tablet; Refill: 0  4. History of anaphylaxis: - Epinephrine as prescribed.  - EPINEPHrine 0.3 mg/0.3 mL IJ SOAJ injection; Inject 0.3 mg into the muscle as needed for anaphylaxis.  Dispense: 2 each; Refill: 0    PATIENT EDUCATION PROVIDED: See AVS   FOLLOW-UP (Include any further testing or referrals): follow-up with primary provider in 3 months or sooner if needed for hypertension. Keep all appointments with Orthopedics.

## 2021-09-30 ENCOUNTER — Encounter: Payer: Self-pay | Admitting: Family

## 2021-09-30 ENCOUNTER — Ambulatory Visit (INDEPENDENT_AMBULATORY_CARE_PROVIDER_SITE_OTHER): Payer: No Typology Code available for payment source | Admitting: Family

## 2021-09-30 ENCOUNTER — Other Ambulatory Visit: Payer: Self-pay

## 2021-09-30 VITALS — BP 127/85 | HR 95 | Temp 98.5°F | Resp 18 | Ht 72.01 in | Wt 279.2 lb

## 2021-09-30 DIAGNOSIS — I10 Essential (primary) hypertension: Secondary | ICD-10-CM

## 2021-09-30 DIAGNOSIS — Z96651 Presence of right artificial knee joint: Secondary | ICD-10-CM | POA: Diagnosis not present

## 2021-09-30 DIAGNOSIS — Z09 Encounter for follow-up examination after completed treatment for conditions other than malignant neoplasm: Secondary | ICD-10-CM

## 2021-09-30 DIAGNOSIS — Z87892 Personal history of anaphylaxis: Secondary | ICD-10-CM | POA: Diagnosis not present

## 2021-09-30 MED ORDER — AMLODIPINE BESYLATE 10 MG PO TABS: 10.0000 mg | ORAL_TABLET | Freq: Every day | ORAL | 0 refills | Status: DC

## 2021-09-30 MED ORDER — EPINEPHRINE 0.3 MG/0.3ML IJ SOAJ: 0.3000 mg | INTRAMUSCULAR | 0 refills | Status: DC | PRN

## 2021-09-30 NOTE — Progress Notes (Signed)
Pt presents for hospitalization follow-up from knee surgery

## 2021-10-01 LAB — BASIC METABOLIC PANEL
BUN/Creatinine Ratio: 11 (ref 9–20)
BUN: 8 mg/dL (ref 6–24)
CO2: 22 mmol/L (ref 20–29)
Calcium: 9.8 mg/dL (ref 8.7–10.2)
Chloride: 102 mmol/L (ref 96–106)
Creatinine, Ser: 0.73 mg/dL — ABNORMAL LOW (ref 0.76–1.27)
Glucose: 77 mg/dL (ref 70–99)
Potassium: 4.1 mmol/L (ref 3.5–5.2)
Sodium: 142 mmol/L (ref 134–144)
eGFR: 108 mL/min/{1.73_m2} (ref >59)

## 2021-10-01 NOTE — Progress Notes (Signed)
Kidney function normal

## 2021-12-03 NOTE — Progress Notes (Signed)
Virtual Visit via Telephone Note  I connected with Darrell Conway, on 12/04/2021 at 2:34 PM by telephone due to the COVID-19 pandemic and verified that I am speaking with the correct person using two identifiers.  Due to current restrictions/limitations of in-office visits due to the COVID-19 pandemic, this scheduled clinical appointment was converted to a telehealth visit.   Consent: I discussed the limitations, risks, security and privacy concerns of performing an evaluation and management service by telephone and the availability of in person appointments. I also discussed with the patient that there may be a patient responsible charge related to this service. The patient expressed understanding and agreed to proceed.   Location of Patient: Home  Location of Provider: [facility] [place] participating in Telemedicine visit: Darrell Conway Durene Fruits, NP [place], [place]   History of Present Illness: Darrell Conway is a 56 year-old male who presents for list of allergy for personal reference. Reports unsure what he is allergic too but does think it is related to his diet. Currently being mindful of the things he eats to prevent episodes. Having to take Benadryl at least 3 or 4 times weekly related to swelling of lips related to allergy. Has not had to use Epi Pen as of present. Doing well on current blood pressure regimen. Checking blood pressures at home and are within normal range. Plans to schedule annual physical exam soon.    Past Medical History:  Diagnosis Date   Anemia    pt denies   Back pain    GERD (gastroesophageal reflux disease)    Gout    Heartburn    High cholesterol    Hip pain    Hypertension    Knee pain    Pneumonia    Rheumatoid arthritis (HCC)    Sleep apnea    no Cpap does not wear   Swallowing difficulty    Allergies  Allergen Reactions   Lisinopril Swelling    Still taking the medication at home.  Pt  reports getting welts on skin around sides/stomach and elsewhere.  He also reports mild lip swell.    Current Outpatient Medications on File Prior to Visit  Medication Sig Dispense Refill   acetaminophen (TYLENOL) 325 MG tablet Take 1-2 tablets (325-650 mg total) by mouth every 6 (six) hours as needed for mild pain (pain score 1-3 or temp > 100.5).     amLODipine (NORVASC) 10 MG tablet Take 1 tablet (10 mg total) by mouth daily. 90 tablet 0   atorvastatin (LIPITOR) 20 MG tablet Take 1 tablet (20 mg total) by mouth daily. 90 tablet 3   celecoxib (CELEBREX) 200 MG capsule Take 1 capsule (200 mg total) by mouth 2 (two) times daily. 60 capsule 0   diclofenac (VOLTAREN) 75 MG EC tablet Take 75 mg by mouth 2 (two) times daily.     docusate sodium (COLACE) 100 MG capsule Take 1 capsule (100 mg total) by mouth 2 (two) times daily. 10 capsule 0   EPINEPHrine 0.3 mg/0.3 mL IJ SOAJ injection Inject 0.3 mg into the muscle as needed for anaphylaxis. 2 each 0   methocarbamol (ROBAXIN) 500 MG tablet Take 1 tablet (500 mg total) by mouth every 6 (six) hours as needed for muscle spasms. 40 tablet 0   Multiple Vitamin (MULTIVITAMIN) capsule Take 1 capsule by mouth daily.     oxyCODONE (OXY IR/ROXICODONE) 5 MG immediate release tablet Take 1-2 tablets (5-10 mg total) by mouth  every 4 (four) hours as needed for severe pain. 56 tablet 0   pantoprazole (PROTONIX) 40 MG tablet Take 1 tablet (40 mg total) by mouth 2 (two) times daily before a meal. 180 tablet 0   polyethylene glycol (MIRALAX / GLYCOLAX) 17 g packet Take 17 g by mouth daily as needed for mild constipation. 14 each 0   [DISCONTINUED] fluticasone (FLONASE) 50 MCG/ACT nasal spray Place 2 sprays into both nostrils daily. 16 g 6   [DISCONTINUED] orlistat (ALLI) 60 MG capsule Take 1 capsule (60 mg total) by mouth 3 (three) times daily with meals. 90 capsule 1   No current facility-administered medications on file prior to visit.     Observations/Objective: Alert and oriented x 3. Not in acute distress. Physical examination not completed as this is a telemedicine visit.  Assessment and Plan: 1. History of anaphylaxis: - Referral to Allergy for further evaluation and management.  - Ambulatory referral to Allergy  2. Essential hypertension: - Continue Amlodipine as prescribed.  - Counseled on blood pressure goal of less than 130/80, low-sodium, DASH diet, medication compliance, 150 minutes of moderate intensity exercise per week as tolerated. Discussed medication compliance, adverse effects. - Follow-up with primary provider in 3 months or sooner if needed.  - amLODipine (NORVASC) 10 MG tablet; Take 1 tablet (10 mg total) by mouth daily.  Dispense: 90 tablet; Refill: 0   Follow Up Instructions: Follow-up with primary provider in 3 months or sooner if needed for management of hypertension.    Patient was given clear instructions to go to Emergency Department or return to medical center if symptoms don't improve, worsen, or new problems develop.The patient verbalized understanding.  I discussed the assessment and treatment plan with the patient. The patient was provided an opportunity to ask questions and all were answered. The patient agreed with the plan and demonstrated an understanding of the instructions.   The patient was advised to call back or seek an in-person evaluation if the symptoms worsen or if the condition fails to improve as anticipated.    I provided 8 minutes total of non-face-to-face time during this encounter.   Camillia Herter, NP  [facility] [place], [place] 12/04/2021, 2:34 PM

## 2021-12-04 ENCOUNTER — Ambulatory Visit (INDEPENDENT_AMBULATORY_CARE_PROVIDER_SITE_OTHER): Payer: No Typology Code available for payment source | Admitting: Family

## 2021-12-04 ENCOUNTER — Other Ambulatory Visit: Payer: Self-pay

## 2021-12-04 DIAGNOSIS — Z87892 Personal history of anaphylaxis: Secondary | ICD-10-CM | POA: Diagnosis not present

## 2021-12-04 DIAGNOSIS — I10 Essential (primary) hypertension: Secondary | ICD-10-CM | POA: Diagnosis not present

## 2021-12-04 MED ORDER — AMLODIPINE BESYLATE 10 MG PO TABS: 10.0000 mg | ORAL_TABLET | Freq: Every day | ORAL | 0 refills | Status: DC

## 2021-12-04 NOTE — Progress Notes (Signed)
Pt presents for telemedicine visit for amlodipine refill

## 2021-12-07 ENCOUNTER — Encounter: Payer: Self-pay | Admitting: Family

## 2021-12-07 ENCOUNTER — Other Ambulatory Visit: Payer: Self-pay | Admitting: Family

## 2021-12-07 DIAGNOSIS — I10 Essential (primary) hypertension: Secondary | ICD-10-CM

## 2021-12-07 MED ORDER — HYDROCHLOROTHIAZIDE 12.5 MG PO TABS: 12.5000 mg | ORAL_TABLET | Freq: Every day | ORAL | 0 refills | Status: DC

## 2021-12-11 ENCOUNTER — Other Ambulatory Visit: Payer: Self-pay | Admitting: Internal Medicine

## 2021-12-11 DIAGNOSIS — I10 Essential (primary) hypertension: Secondary | ICD-10-CM

## 2021-12-14 ENCOUNTER — Ambulatory Visit
Admission: EM | Admit: 2021-12-14 | Discharge: 2021-12-14 | Disposition: A | Discharge status: Home / Self Care | Payer: No Typology Code available for payment source | Attending: Physician Assistant | Admitting: Physician Assistant

## 2021-12-14 ENCOUNTER — Encounter: Payer: Self-pay | Admitting: Emergency Medicine

## 2021-12-14 ENCOUNTER — Other Ambulatory Visit: Payer: Self-pay

## 2021-12-14 ENCOUNTER — Ambulatory Visit (INDEPENDENT_AMBULATORY_CARE_PROVIDER_SITE_OTHER): Payer: No Typology Code available for payment source

## 2021-12-14 DIAGNOSIS — M545 Low back pain, unspecified: Secondary | ICD-10-CM

## 2021-12-14 DIAGNOSIS — W19XXXA Unspecified fall, initial encounter: Secondary | ICD-10-CM | POA: Diagnosis not present

## 2021-12-14 MED ORDER — CYCLOBENZAPRINE HCL 10 MG PO TABS: 10.0000 mg | ORAL_TABLET | Freq: Two times a day (BID) | ORAL | 0 refills | Status: DC | PRN

## 2021-12-14 MED ORDER — PREDNISONE 20 MG PO TABS
40.0000 mg | ORAL_TABLET | Freq: Every day | ORAL | 0 refills | Status: AC
Start: 2021-12-14 — End: 2021-12-19

## 2021-12-14 NOTE — ED Provider Notes (Signed)
[facility]    CSN: [id number] Arrival date & time: 12/14/21  1250      History   Chief Complaint Chief Complaint  Patient presents with   Fall         HPI Darrell Conway is a 56 y.o. male.   Patient is here today for evaluation of low back pain and buttock pain that started last night after the chair he sat in collapsed. He reports area is tender to touch. He denies any numbness or tingling. He has not had any loss of bowel or bladder function.   The history is provided by the patient.  Fall Pertinent negatives include no shortness of breath.   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   Essential hypertension 01/03/2021   Osteoarthritis of right knee 11/09/2020   Pain in joint of right knee 11/09/2020   Low back pain 10/15/2020   Irregular heart beat 02/14/2020   History of COVID-19 02/13/2020   Amnesia 02/13/2020   Poor concentration 02/13/2020   Muscle pain 02/13/2020   Unilateral primary osteoarthritis, left hip 01/31/2020   Morbid (severe) obesity due to excess calories ([place]) 06/29/2019   Primary osteoarthritis of both knees 05/05/2019   Primary osteoarthritis of both hips    Multiple joint pain 04/26/2019   Pain in left foot 04/26/2019   Obstructive sleep apnea of adult 04/11/2019   Laryngopharyngeal reflux 04/11/2019    Past Surgical History:  Procedure Laterality Date   HERNIA REPAIR     TOTAL KNEE ARTHROPLASTY Right 07/30/2021   Procedure: TOTAL KNEE ARTHROPLASTY;  Surgeon: Paralee Cancel, MD;  Location: [facility];  Service: Orthopedics;  Laterality: Right;        Home Medications    Prior to Admission medications   Medication Sig Start Date End Date Taking? Authorizing Provider  cyclobenzaprine (FLEXERIL) 10 MG tablet Take 1 tablet (10 mg total) by mouth 2 (two) times daily as needed for muscle spasms. 12/14/21  Yes Francene Finders, PA-C  predniSONE (DELTASONE) 20 MG tablet Take 2 tablets (40 mg total) by mouth daily with breakfast for 5 days. 12/14/21 12/19/21 Yes Francene Finders, PA-C  acetaminophen (TYLENOL) 325 MG tablet Take 1-2 tablets (325-650 mg total) by mouth every 6 (six) hours as needed for mild pain (pain score 1-3 or temp > 100.5). 08/02/21   Irving Copas, PA-C  amLODipine (NORVASC) 10 MG tablet Take 1 tablet (10 mg total) by mouth daily. 12/04/21   Camillia Herter, NP  atorvastatin (LIPITOR) 20 MG tablet Take 1 tablet (20 mg total) by mouth daily. 03/28/21   Nicolette Bang, MD  celecoxib (CELEBREX) 200 MG capsule Take 1 capsule (200 mg total) by mouth 2 (two) times daily. 08/02/21   Irving Copas, PA-C  diclofenac (VOLTAREN) 75 MG EC tablet Take 75 mg by mouth 2 (two) times daily. 09/11/21   [provider]  docusate sodium (COLACE) 100 MG capsule Take 1 capsule (  100 mg total) by mouth 2 (two) times daily. 08/02/21   Irving Copas, PA-C  EPINEPHrine 0.3 mg/0.3 mL IJ SOAJ injection Inject 0.3 mg into the muscle as needed for anaphylaxis. 09/30/21   Camillia Herter, NP  hydrochlorothiazide (HYDRODIURIL) 12.5 MG tablet Take 1 tablet (12.5 mg total) by mouth daily. 12/07/21 01/06/22  Camillia Herter, NP  methocarbamol (ROBAXIN) 500 MG tablet Take 1 tablet (500 mg total) by mouth every 6 (six) hours as needed for muscle spasms. 08/02/21   Irving Copas, PA-C  Multiple Vitamin (MULTIVITAMIN) capsule Take 1 capsule by mouth daily.    [provider]  oxyCODONE (OXY IR/ROXICODONE) 5 MG immediate release tablet Take 1-2 tablets (5-10 mg total) by mouth every 4 (four) hours as needed for severe pain. Patient  not taking: Reported on 12/14/2021 08/02/21   Irving Copas, PA-C  pantoprazole (PROTONIX) 40 MG tablet Take 1 tablet (40 mg total) by mouth 2 (two) times daily before a meal. 09/27/21 12/26/21  Fenton Foy, NP  polyethylene glycol (MIRALAX / GLYCOLAX) 17 g packet Take 17 g by mouth daily as needed for mild constipation. 08/02/21   Irving Copas, PA-C  fluticasone (FLONASE) 50 MCG/ACT nasal spray Place 2 sprays into both nostrils daily. 01/06/20 07/29/20  Nicolette Bang, MD  orlistat (ALLI) 60 MG capsule Take 1 capsule (60 mg total) by mouth 3 (three) times daily with meals. 02/29/20 07/29/20  Nicolette Bang, MD    Family History Family History  Problem Relation Age of Onset   Rheum arthritis Mother    Hypertension Mother    Rheum arthritis Sister     Social History Social History   Tobacco Use   Smoking status: Former    Packs/day: 0.75    Types: Cigarettes    Quit date: 2015    Years since quitting: 8.0   Smokeless tobacco: Never  Vaping Use   Vaping Use: Never used  Substance Use Topics   Alcohol use: Not Currently   Drug use: No     Allergies   Lisinopril   Review of Systems Review of Systems  Constitutional:  Negative for chills and fever.  Eyes:  Negative for discharge and redness.  Respiratory:  Negative for shortness of breath.   Musculoskeletal:  Positive for back pain and myalgias.  Neurological:  Negative for numbness.    Physical Exam Triage Vital Signs ED Triage Vitals  Enc Vitals Group     BP 12/14/21 1308 (!) 168/93     Pulse Rate 12/14/21 1308 83     Resp 12/14/21 1308 18     Temp 12/14/21 1308 98.2 F (36.8 C)     Temp Source 12/14/21 1308 Oral     SpO2 12/14/21 1308 94 %     Weight --      Height --      Head Circumference --      Peak Flow --      Pain Score 12/14/21 1309 7     Pain Loc --      Pain Edu? --      Excl. in [place]? --    No data found.  Updated Vital Signs BP (!) 168/93 (BP Location: Left Arm)     Pulse 83    Temp 98.2 F (36.8 C) (Oral)    Resp 18    SpO2 94%       Physical Exam Vitals and nursing note reviewed.  Constitutional:  General: He is not in acute distress.    Appearance: Normal appearance. He is not ill-appearing.  HENT:     Head: Normocephalic and atraumatic.  Eyes:     Conjunctiva/sclera: Conjunctivae normal.  Cardiovascular:     Rate and Rhythm: Normal rate.  Pulmonary:     Effort: Pulmonary effort is normal.  Musculoskeletal:     Comments: Mild TTP to lower back diffusely  Neurological:     Mental Status: He is alert.  Psychiatric:        Mood and Affect: Mood normal.        Behavior: Behavior normal.        Thought Content: Thought content normal.     UC Treatments / Results  Labs (all labs ordered are listed, but only abnormal results are displayed) Labs Reviewed - No data to display  EKG   Radiology DG Lumbar Spine Complete  Result Date: 12/14/2021 CLINICAL DATA:  Lower back pain status post fall. EXAM: LUMBAR SPINE - COMPLETE 4+ VIEW COMPARISON:  None. FINDINGS: Normal anatomic alignment. No evidence for acute fracture or dislocation. Relative preservation of the vertebral body and intervertebral disc space heights. SI joints are unremarkable. IMPRESSION: No acute osseous abnormality. Electronically Signed   By: Lovey Newcomer M.D.   On: 12/14/2021 13:59   DG Sacrum/Coccyx  Result Date: 12/14/2021 CLINICAL DATA:  Fall.  Pain. EXAM: SACRUM AND COCCYX - 2+ VIEW COMPARISON:  None. FINDINGS: There is no evidence of fracture or other focal bone lesions. IMPRESSION: Negative. Electronically Signed   By: Misty Stanley M.D.   On: 12/14/2021 13:57    Procedures Procedures (including critical care time)  Medications Ordered in UC Medications - No data to display  Initial Impression / Assessment and Plan / UC Course  I have reviewed the triage vital signs and the nursing notes.  Pertinent labs & imaging results that were available during my care  of the patient were reviewed by me and considered in my medical decision making (see chart for details).   Xray without fracture. Prednisone and muscle relaxer prescribed. Encouraged follow up with any further concerns.   Final Clinical Impressions(s) / UC Diagnoses   Final diagnoses:  Acute bilateral low back pain without sciatica   Discharge Instructions   None    ED Prescriptions     Medication Sig Dispense Auth. Provider   predniSONE (DELTASONE) 20 MG tablet Take 2 tablets (40 mg total) by mouth daily with breakfast for 5 days. 10 tablet Ewell Poe F, PA-C   cyclobenzaprine (FLEXERIL) 10 MG tablet Take 1 tablet (10 mg total) by mouth 2 (two) times daily as needed for muscle spasms. 20 tablet Francene Finders, PA-C      PDMP not reviewed this encounter.   Francene Finders, PA-C 12/14/21 1446

## 2021-12-14 NOTE — ED Triage Notes (Signed)
Pt sts sat in chair last night that collapsed and he fell to concrete floor; pt c/o lower back and buttocks pain

## 2021-12-17 DIAGNOSIS — G8929 Other chronic pain: Secondary | ICD-10-CM | POA: Insufficient documentation

## 2022-01-19 DIAGNOSIS — M1712 Unilateral primary osteoarthritis, left knee: Secondary | ICD-10-CM | POA: Insufficient documentation

## 2022-02-11 ENCOUNTER — Other Ambulatory Visit: Payer: Self-pay

## 2022-02-11 DIAGNOSIS — K219 Gastro-esophageal reflux disease without esophagitis: Secondary | ICD-10-CM

## 2022-02-11 MED ORDER — PANTOPRAZOLE SODIUM 40 MG PO TBEC: 40.0000 mg | DELAYED_RELEASE_TABLET | Freq: Two times a day (BID) | ORAL | 0 refills | Status: DC

## 2022-04-13 ENCOUNTER — Other Ambulatory Visit: Payer: Self-pay | Admitting: Family

## 2022-04-13 DIAGNOSIS — I10 Essential (primary) hypertension: Secondary | ICD-10-CM

## 2022-04-18 ENCOUNTER — Other Ambulatory Visit: Payer: Self-pay | Admitting: Family

## 2022-04-18 DIAGNOSIS — K219 Gastro-esophageal reflux disease without esophagitis: Secondary | ICD-10-CM

## 2022-04-18 DIAGNOSIS — E785 Hyperlipidemia, unspecified: Secondary | ICD-10-CM

## 2022-04-18 DIAGNOSIS — I10 Essential (primary) hypertension: Secondary | ICD-10-CM

## 2022-04-18 NOTE — Telephone Encounter (Unsigned)
Copied from [place] [phone]. Topic: General - Other >> Apr 18, 2022  8:43 AM Tessa Lerner A wrote: Reason for CRM: Medication Refill - Medication: amLODipine (NORVASC) 10 MG tablet [[id number]]  atorvastatin (LIPITOR) 20 MG tablet [[id number]]   pantoprazole (PROTONIX) 40 MG tablet [[id number]]   Has the patient contacted their pharmacy? No. The patient's wife declined to speak with their pharmacy  (Agent: If no, request that the patient contact the pharmacy for the refill. If patient does not wish to contact the pharmacy document the reason why and proceed with request.) (Agent: If yes, when and what did the pharmacy advise?)  Preferred Pharmacy (with phone number or street name): [id number] [place] [id number]: [phone]: [phone]: Open 24 hours  Has the patient been seen for an appointment in the last year OR does the patient have an upcoming appointment? Yes.    Agent: Please be advised that RX refills may take up to 3 business days. We ask that you follow-up with your pharmacy.

## 2022-04-22 MED ORDER — PANTOPRAZOLE SODIUM 40 MG PO TBEC: 40.0000 mg | DELAYED_RELEASE_TABLET | Freq: Two times a day (BID) | ORAL | 0 refills | Status: DC

## 2022-04-22 NOTE — Telephone Encounter (Signed)
Requested Prescriptions  Pending Prescriptions Disp Refills  . amLODipine (NORVASC) 10 MG tablet 90 tablet 0    Sig: Take 1 tablet (10 mg total) by mouth daily.     Cardiovascular: Calcium Channel Blockers 2 Failed - 04/18/2022  1:21 PM      Failed - Last BP in normal range    BP Readings from Last 1 Encounters:  12/14/21 (!) 168/93         Failed - Valid encounter within last 6 months    Recent Outpatient Visits          4 months ago History of anaphylaxis   Primary Care at [facility], [place], NP   6 months ago Hospital discharge follow-up   Primary Care at [facility], [place], NP   1 year ago Essential hypertension   Primary Care at [facility], [place], MD   1 year ago Essential hypertension   Primary Care at [facility], [place], MD   1 year ago Essential hypertension   Primary Care at [facility], [place], MD             Passed - Last Heart Rate in normal range    Pulse Readings from Last 1 Encounters:  12/14/21 83         . atorvastatin (LIPITOR) 20 MG tablet 90 tablet 3    Sig: Take 1 tablet (20 mg total) by mouth daily.     Cardiovascular:  Antilipid - Statins Failed - 04/18/2022  1:21 PM      Failed - Lipid Panel in normal range within the last 12 months    Cholesterol, Total  Date Value Ref Range Status  12/31/2020 151 100 - 199 mg/dL Final   LDL Chol Calc (NIH)  Date Value Ref Range Status  12/31/2020 97 0 - 99 mg/dL Final   LDL Direct  Date Value Ref Range Status  10/02/2020 193 (H) 0 - 99 mg/dL Final   HDL  Date Value Ref Range Status  12/31/2020 40 >39 mg/dL Final   Triglycerides  Date Value Ref Range Status  12/31/2020 70 0 - 149 mg/dL Final         Passed - Patient is not pregnant      Passed - Valid encounter within last 12 months    Recent Outpatient Visits          4 months ago History of anaphylaxis   Primary Care at [facility], [place],  NP   6 months ago Hospital discharge follow-up   Primary Care at [facility], [place], NP   1 year ago Essential hypertension   Primary Care at [facility], [place], MD   1 year ago Essential hypertension   Primary Care at [facility], [place], MD   1 year ago Essential hypertension   Primary Care at [facility], [place], MD             . pantoprazole (PROTONIX) 40 MG tablet 180 tablet 0    Sig: Take 1 tablet (40 mg total) by mouth 2 (two) times daily before a meal.     Gastroenterology: Proton Pump Inhibitors Passed - 04/18/2022  1:21 PM      Passed - Valid encounter within last 12 months    Recent Outpatient Visits          4 months ago History of anaphylaxis   Primary Care  at [facility], [place], NP   6 months ago Hospital discharge follow-up   Primary Care at [facility], [place], NP   1 year ago Essential hypertension   Primary Care at [facility], [place], MD   1 year ago Essential hypertension   Primary Care at [facility], [place], MD   1 year ago Essential hypertension   Primary Care at [facility], [place], MD

## 2022-04-22 NOTE — Telephone Encounter (Signed)
Requested medication (s) are due for refill today: yes  Requested medication (s) are on the active medication list: yes  Last refill:  norvasc sent on 04/14/22 #90/0, atovastatin 03/28/21 #90/3  Future visit scheduled: no  Notes to clinic:  Unable to refill per protocol due to failed labs, no updated results for atorvastatin.      Requested Prescriptions  Pending Prescriptions Disp Refills   amLODipine (NORVASC) 10 MG tablet 90 tablet 0    Sig: Take 1 tablet (10 mg total) by mouth daily.     Cardiovascular: Calcium Channel Blockers 2 Failed - 04/18/2022  1:21 PM      Failed - Last BP in normal range    BP Readings from Last 1 Encounters:  12/14/21 (!) 168/93         Failed - Valid encounter within last 6 months    Recent Outpatient Visits           4 months ago History of anaphylaxis   Primary Care at [facility], [place], NP   6 months ago Hospital discharge follow-up   Primary Care at [facility], [place], NP   1 year ago Essential hypertension   Primary Care at [facility], [place], MD   1 year ago Essential hypertension   Primary Care at [facility], [place], MD   1 year ago Essential hypertension   Primary Care at [facility], [place], MD               Passed - Last Heart Rate in normal range    Pulse Readings from Last 1 Encounters:  12/14/21 83          atorvastatin (LIPITOR) 20 MG tablet 90 tablet 3    Sig: Take 1 tablet (20 mg total) by mouth daily.     Cardiovascular:  Antilipid - Statins Failed - 04/18/2022  1:21 PM      Failed - Lipid Panel in normal range within the last 12 months    Cholesterol, Total  Date Value Ref Range Status  12/31/2020 151 100 - 199 mg/dL Final   LDL Chol Calc (NIH)  Date Value Ref Range Status  12/31/2020 97 0 - 99 mg/dL Final   LDL Direct  Date Value Ref Range Status  10/02/2020 193 (H) 0 - 99 mg/dL Final   HDL  Date Value Ref Range  Status  12/31/2020 40 >39 mg/dL Final   Triglycerides  Date Value Ref Range Status  12/31/2020 70 0 - 149 mg/dL Final         Passed - Patient is not pregnant      Passed - Valid encounter within last 12 months    Recent Outpatient Visits           4 months ago History of anaphylaxis   Primary Care at [facility], [place], NP   6 months ago Hospital discharge follow-up   Primary Care at [facility], [place], NP   1 year ago Essential hypertension   Primary Care at [facility], [place] Be[place], MD   1 year ago Essential hypertension   Primary Care at [facility], [place], MD   1 year ago Essential hypertension   Primary Care at [facility], [place], MD               Signed Prescriptions Disp Refills   pantoprazole (PROTONIX) 40 MG tablet 180 tablet  0    Sig: Take 1 tablet (40 mg total) by mouth 2 (two) times daily before a meal.     Gastroenterology: Proton Pump Inhibitors Passed - 04/18/2022  1:21 PM      Passed - Valid encounter within last 12 months    Recent Outpatient Visits           4 months ago History of anaphylaxis   Primary Care at [facility], [place]ail[place], NP   6 months ago Hospital discharge follow-up   Primary Care at [facility], [place], NP   1 year ago Essential hypertension   Primary Care at [facility], [place] Be[place], MD   1 year ago Essential hypertension   Primary Care at [facility], [place] Be[place], MD   1 year ago Essential hypertension   Primary Care at [facility], [place], MD

## 2022-05-23 DIAGNOSIS — M109 Gout, unspecified: Secondary | ICD-10-CM | POA: Insufficient documentation

## 2022-05-23 DIAGNOSIS — I10 Essential (primary) hypertension: Secondary | ICD-10-CM | POA: Insufficient documentation

## 2022-07-02 ENCOUNTER — Encounter (INDEPENDENT_AMBULATORY_CARE_PROVIDER_SITE_OTHER): Payer: Self-pay

## 2022-08-05 ENCOUNTER — Telehealth: Payer: Self-pay | Admitting: Family

## 2022-08-05 NOTE — Telephone Encounter (Signed)
Appt was made for next available.   Spouse says referral for sleep study/ apnea.  Please advise and thank you.

## 2022-08-05 NOTE — Telephone Encounter (Signed)
Sleep study can be discussed at 09/27 appt

## 2022-08-06 NOTE — Progress Notes (Deleted)
Patient ID: Darrell Conway, male    DOB: 1966-10-08  MRN: [id number]  CC: Chronic Care Management   Subjective: Darrell Conway is a 56 y.o. male who presents for chronic care management.  His concerns today include:  HTN - Amlodipine, HCTZ HLD - Atorvastatin  Diabetes screening  Sleep study referral   Patient Active Problem List   Diagnosis Date Noted   S/P total knee arthroplasty, right 07/30/2021   Essential hypertension 01/03/2021   Osteoarthritis of right knee 11/09/2020   Pain in joint of right knee 11/09/2020   Low back pain 10/15/2020   Irregular heart beat 02/14/2020   History of COVID-19 02/13/2020   Amnesia 02/13/2020   Poor concentration 02/13/2020   Muscle pain 02/13/2020   Unilateral primary osteoarthritis, left hip 01/31/2020   Morbid (severe) obesity due to excess calories ([place]) 06/29/2019   Primary osteoarthritis of both knees 05/05/2019   Primary osteoarthritis of both hips    Multiple joint pain 04/26/2019   Pain in left foot 04/26/2019   Obstructive sleep apnea of adult 04/11/2019   Laryngopharyngeal reflux 04/11/2019     Current Outpatient Medications on File Prior to Visit  Medication Sig Dispense Refill   acetaminophen (TYLENOL) 325 MG tablet Take 1-2 tablets (325-650 mg total) by mouth every 6 (six) hours as needed for mild pain (pain score 1-3 or temp > 100.5).     amLODipine (NORVASC) 10 MG tablet TAKE 1 TABLET BY MOUTH EVERY DAY 90 tablet 0   atorvastatin (LIPITOR) 20 MG tablet Take 1 tablet (20 mg total) by mouth daily. 90 tablet 3   celecoxib (CELEBREX) 200 MG capsule Take 1 capsule (200 mg total) by mouth 2 (two) times daily. 60 capsule 0   cyclobenzaprine (FLEXERIL) 10 MG tablet Take 1 tablet (10 mg total) by mouth 2 (two) times daily as needed for muscle spasms. 20 tablet 0   diclofenac (VOLTAREN) 75 MG EC tablet Take 75 mg by mouth 2 (two) times daily.     docusate sodium (COLACE) 100 MG capsule Take 1 capsule (100 mg total) by mouth 2  (two) times daily. 10 capsule 0   EPINEPHrine 0.3 mg/0.3 mL IJ SOAJ injection Inject 0.3 mg into the muscle as needed for anaphylaxis. 2 each 0   hydrochlorothiazide (HYDRODIURIL) 12.5 MG tablet Take 1 tablet (12.5 mg total) by mouth daily. 30 tablet 0   methocarbamol (ROBAXIN) 500 MG tablet Take 1 tablet (500 mg total) by mouth every 6 (six) hours as needed for muscle spasms. 40 tablet 0   Multiple Vitamin (MULTIVITAMIN) capsule Take 1 capsule by mouth daily.     oxyCODONE (OXY IR/ROXICODONE) 5 MG immediate release tablet Take 1-2 tablets (5-10 mg total) by mouth every 4 (four) hours as needed for severe pain. (Patient not taking: Reported on 12/14/2021) 56 tablet 0   pantoprazole (PROTONIX) 40 MG tablet Take 1 tablet (40 mg total) by mouth 2 (two) times daily before a meal. 180 tablet 0   polyethylene glycol (MIRALAX / GLYCOLAX) 17 g packet Take 17 g by mouth daily as needed for mild constipation. 14 each 0   [DISCONTINUED] fluticasone (FLONASE) 50 MCG/ACT nasal spray Place 2 sprays into both nostrils daily. 16 g 6   [DISCONTINUED] orlistat (ALLI) 60 MG capsule Take 1 capsule (60 mg total) by mouth 3 (three) times daily with meals. 90 capsule 1   No current facility-administered medications on file prior to visit.    Allergies  Allergen Reactions   Lisinopril Swelling  Still taking the medication at home.  Pt reports getting welts on skin around sides/stomach and elsewhere.  He also reports mild lip swell.    Social History   Socioeconomic History   Marital status: Married    Spouse name: Not on file   Number of children: Not on file   Years of education: Not on file   Highest education level: Not on file  Occupational History   Occupation: Clinical biochemist  Tobacco Use   Smoking status: Former    Packs/day: 0.75    Types: Cigarettes    Quit date: 2015    Years since quitting: 8.7   Smokeless tobacco: Never  Vaping Use   Vaping Use: Never used  Substance and Sexual  Activity   Alcohol use: Not Currently   Drug use: No   Sexual activity: Not Currently    Birth control/protection: None  Other Topics Concern   Not on file  Social History Narrative   Not on file   Social Determinants of Health   Financial Resource Strain: Not on file  Food Insecurity: Not on file  Transportation Needs: Not on file  Physical Activity: Not on file  Stress: Not on file  Social Connections: Not on file  Intimate Partner Violence: Not on file    Family History  Problem Relation Age of Onset   Rheum arthritis Mother    Hypertension Mother    Rheum arthritis Sister     Past Surgical History:  Procedure Laterality Date   HERNIA REPAIR     TOTAL KNEE ARTHROPLASTY Right 07/30/2021   Procedure: TOTAL KNEE ARTHROPLASTY;  Surgeon: Paralee Cancel, MD;  Location: [facility];  Service: Orthopedics;  Laterality: Right;    ROS: Review of Systems Negative except as stated above  PHYSICAL EXAM: There were no vitals taken for this visit.  Physical Exam  {male adult master:310786} {male adult master:310785}     Latest Ref Rng & Units 09/30/2021    3:14 PM 07/31/2021    3:22 AM 07/18/2021    9:04 AM  CMP  Glucose 70 - 99 mg/dL 77  140  97   BUN 6 - 24 mg/dL '8  10  12   '$ Creatinine 0.76 - 1.27 mg/dL 0.73  0.67  0.76   Sodium 134 - 144 mmol/L 142  134  137   Potassium 3.5 - 5.2 mmol/L 4.1  3.9  4.0   Chloride 96 - 106 mmol/L 102  102  107   CO2 20 - 29 mmol/L '22  24  24   '$ Calcium 8.7 - 10.2 mg/dL 9.8  9.1  9.5   Total Protein 6.5 - 8.1 g/dL   8.0   Total Bilirubin 0.3 - 1.2 mg/dL   0.8   Alkaline Phos 38 - 126 U/L   77   AST 15 - 41 U/L   32   ALT 0 - 44 U/L   58    Lipid Panel     Component Value Date/Time   CHOL 151 12/31/2020 0847   TRIG 70 12/31/2020 0847   HDL 40 12/31/2020 0847   CHOLHDL 3.8 05/31/2019 0902   LDLCALC 97 12/31/2020 0847   LDLDIRECT 193 (H) 10/02/2020 1444    CBC    Component Value Date/Time   WBC 16.9 (H) 08/01/2021 0314   RBC  4.46 08/01/2021 0314   HGB 13.0 08/01/2021 0314   HGB 15.2 10/02/2020 1444   HCT 38.0 (L) 08/01/2021 0314   HCT 45.7 10/02/2020 1444  PLT 223 08/01/2021 0314   PLT 285 10/02/2020 1444   MCV 85.2 08/01/2021 0314   MCV 85 10/02/2020 1444   MCH 29.1 08/01/2021 0314   MCHC 34.2 08/01/2021 0314   RDW 13.1 08/01/2021 0314   RDW 13.3 10/02/2020 1444   LYMPHSABS 1.3 10/02/2020 1444   MONOABS 0.6 01/06/2019 1454   EOSABS 0.0 10/02/2020 1444   BASOSABS 0.0 10/02/2020 1444    ASSESSMENT AND PLAN:  There are no diagnoses linked to this encounter.   Patient was given the opportunity to ask questions.  Patient verbalized understanding of the plan and was able to repeat key elements of the plan. Patient was given clear instructions to go to Emergency Department or return to medical center if symptoms don't improve, worsen, or new problems develop.The patient verbalized understanding.   No orders of the defined types were placed in this encounter.    Requested Prescriptions    No prescriptions requested or ordered in this encounter    No follow-ups on file.  Camillia Herter, NP

## 2022-08-20 ENCOUNTER — Ambulatory Visit (INDEPENDENT_AMBULATORY_CARE_PROVIDER_SITE_OTHER): Payer: No Typology Code available for payment source | Admitting: Family

## 2022-08-20 ENCOUNTER — Encounter: Payer: Self-pay | Admitting: Family

## 2022-08-20 ENCOUNTER — Ambulatory Visit: Payer: No Typology Code available for payment source | Admitting: Family

## 2022-08-20 VITALS — BP 133/89 | HR 83 | Temp 98.3°F | Resp 16 | Ht 72.01 in | Wt 300.0 lb

## 2022-08-20 DIAGNOSIS — R7303 Prediabetes: Secondary | ICD-10-CM | POA: Diagnosis not present

## 2022-08-20 DIAGNOSIS — R0683 Snoring: Secondary | ICD-10-CM

## 2022-08-20 DIAGNOSIS — G4733 Obstructive sleep apnea (adult) (pediatric): Secondary | ICD-10-CM

## 2022-08-20 DIAGNOSIS — E785 Hyperlipidemia, unspecified: Secondary | ICD-10-CM | POA: Diagnosis not present

## 2022-08-20 DIAGNOSIS — I10 Essential (primary) hypertension: Secondary | ICD-10-CM | POA: Diagnosis not present

## 2022-08-20 DIAGNOSIS — Z9989 Dependence on other enabling machines and devices: Secondary | ICD-10-CM

## 2022-08-20 MED ORDER — AMLODIPINE BESYLATE 10 MG PO TABS: 10.0000 mg | ORAL_TABLET | Freq: Every day | ORAL | 2 refills | Status: DC

## 2022-08-20 MED ORDER — ATORVASTATIN CALCIUM 20 MG PO TABS: 20.0000 mg | ORAL_TABLET | Freq: Every day | ORAL | 2 refills | Status: DC

## 2022-08-20 NOTE — Progress Notes (Signed)
Pt presents for CPAP supplies -prev CPAP broke and unsure who services it  -advised pt that will send sleep study from 2020 to [place] [facility] and see if they can set him up for supplies  -needs refill on Colchine and Pantoprazole

## 2022-08-20 NOTE — Progress Notes (Signed)
Patient ID: Darrell Conway, male    DOB: 1966/08/27  MRN: [id number]  CC: Referral   Subjective: Darrell Conway is a 56 y.o. male who presents for referral.   His concerns today include:  - Requesting referral for sleep apnea study.  - Taking blood pressure and cholesterol medications without issues or concerns.   Patient Active Problem List   Diagnosis Date Noted   Chronic pain of left ankle 12/17/2021   S/P total knee arthroplasty, right 07/30/2021   History of total knee arthroplasty 07/30/2021   Essential hypertension 01/03/2021   Osteoarthritis of right knee 11/09/2020   Pain in joint of right knee 11/09/2020   Low back pain 10/15/2020   Irregular heart beat 02/14/2020   Personal history of COVID-19 02/13/2020   Amnesia 02/13/2020   Poor concentration 02/13/2020   Muscle pain 02/13/2020   Unilateral primary osteoarthritis, left hip 01/31/2020   Morbid obesity ([place]) 06/29/2019   Primary osteoarthritis of both knees 05/05/2019   Primary osteoarthritis of both hips    Multiple joint pain 04/26/2019   Pain in left foot 04/26/2019   Obstructive sleep apnea of adult 04/11/2019   Laryngopharyngeal reflux 04/11/2019     Current Outpatient Medications on File Prior to Visit  Medication Sig Dispense Refill   acetaminophen (TYLENOL) 325 MG tablet Take 1-2 tablets (325-650 mg total) by mouth every 6 (six) hours as needed for mild pain (pain score 1-3 or temp > 100.5).     celecoxib (CELEBREX) 200 MG capsule Take 1 capsule (200 mg total) by mouth 2 (two) times daily. 60 capsule 0   colchicine 0.6 MG tablet Take by mouth.     cyclobenzaprine (FLEXERIL) 10 MG tablet Take 1 tablet (10 mg total) by mouth 2 (two) times daily as needed for muscle spasms. 20 tablet 0   diclofenac (VOLTAREN) 75 MG EC tablet Take 75 mg by mouth 2 (two) times daily.     docusate sodium (COLACE) 100 MG capsule Take 1 capsule (100 mg total) by mouth 2 (two) times daily. 10 capsule 0   EPINEPHrine 0.3  mg/0.3 mL IJ SOAJ injection Inject 0.3 mg into the muscle as needed for anaphylaxis. 2 each 0   hydrochlorothiazide (HYDRODIURIL) 12.5 MG tablet Take 1 tablet (12.5 mg total) by mouth daily. 30 tablet 0   methocarbamol (ROBAXIN) 500 MG tablet Take 1 tablet (500 mg total) by mouth every 6 (six) hours as needed for muscle spasms. 40 tablet 0   Multiple Vitamin (MULTIVITAMIN) capsule Take 1 capsule by mouth daily.     pantoprazole (PROTONIX) 40 MG tablet Take 1 tablet (40 mg total) by mouth 2 (two) times daily before a meal. 180 tablet 0   polyethylene glycol (MIRALAX / GLYCOLAX) 17 g packet Take 17 g by mouth daily as needed for mild constipation. 14 each 0   [DISCONTINUED] fluticasone (FLONASE) 50 MCG/ACT nasal spray Place 2 sprays into both nostrils daily. 16 g 6   [DISCONTINUED] orlistat (ALLI) 60 MG capsule Take 1 capsule (60 mg total) by mouth 3 (three) times daily with meals. 90 capsule 1   No current facility-administered medications on file prior to visit.    Allergies  Allergen Reactions   Lisinopril Swelling    Still taking the medication at home.  Pt reports getting welts on skin around sides/stomach and elsewhere.  He also reports mild lip swell.    Social History   Socioeconomic History   Marital status: Married    Spouse name: Not  on file   Number of children: Not on file   Years of education: Not on file   Highest education level: Not on file  Occupational History   Occupation: Clinical biochemist  Tobacco Use   Smoking status: Former    Packs/day: 0.75    Types: Cigarettes    Quit date: 2015    Years since quitting: 8.7    Passive exposure: Past   Smokeless tobacco: Never  Vaping Use   Vaping Use: Never used  Substance and Sexual Activity   Alcohol use: Not Currently   Drug use: No   Sexual activity: Not Currently    Birth control/protection: None  Other Topics Concern   Not on file  Social History Narrative   Not on file   Social Determinants of Health    Financial Resource Strain: Not on file  Food Insecurity: Not on file  Transportation Needs: Not on file  Physical Activity: Not on file  Stress: Not on file  Social Connections: Not on file  Intimate Partner Violence: Not on file    Family History  Problem Relation Age of Onset   Rheum arthritis Mother    Hypertension Mother    Rheum arthritis Sister     Past Surgical History:  Procedure Laterality Date   HERNIA REPAIR     TOTAL KNEE ARTHROPLASTY Right 07/30/2021   Procedure: TOTAL KNEE ARTHROPLASTY;  Surgeon: Paralee Cancel, MD;  Location: [facility];  Service: Orthopedics;  Laterality: Right;    ROS: Review of Systems Negative except as stated above  PHYSICAL EXAM: BP 133/89 (BP Location: Left Arm, Patient Position: Sitting, Cuff Size: Large)   Pulse 83   Temp 98.3 F (36.8 C)   Resp 16   Ht 6' 0.01" (1.829 m)   Wt 300 lb (136.1 kg)   SpO2 95%   BMI 40.68 kg/m   Physical Exam HENT:     Head: Normocephalic and atraumatic.  Eyes:     Extraocular Movements: Extraocular movements intact.     Conjunctiva/sclera: Conjunctivae normal.     Pupils: Pupils are equal, round, and reactive to light.  Cardiovascular:     Rate and Rhythm: Normal rate and regular rhythm.     Pulses: Normal pulses.     Heart sounds: Normal heart sounds.  Pulmonary:     Effort: Pulmonary effort is normal.     Breath sounds: Normal breath sounds.  Musculoskeletal:     Cervical back: Normal range of motion and neck supple.  Neurological:     General: No focal deficit present.     Mental Status: He is alert and oriented to person, place, and time.  Psychiatric:        Mood and Affect: Mood normal.        Behavior: Behavior normal.     ASSESSMENT AND PLAN: 1. OSA on CPAP - Patient had home sleep study test 06/15/2019 and outpatient sleep study test 06/21/2019. His health insurance will cover repeat studies if it has been at least 5 years since the last. Since repeat study not covered by  health insurance presently will send CPAP and associated supplies to [facility] to see if they will be able to assist. Discussed with patient this may generate bill with [employer] that he will be responsible for and he verbalized understanding. Discussed with patient if [employer] cost is unreasonable he can try to call other CPAP offices in the area to see if they have better prices or call his  health insurance to see if they have further advisement. He is aware to notify me if he experiences any challenges with getting CPAP and supplies.   2. Primary hypertension - Continue Amlodipine as prescribed.  - Counseled on blood pressure goal of less than 130/80, low-sodium, DASH diet, medication compliance, and 150 minutes of moderate intensity exercise per week as tolerated. Counseled on medication adherence and adverse effects. - Update BMP.  - Follow-up with primary provider in 3 months or sooner if needed.  - Basic Metabolic Panel - amLODipine (NORVASC) 10 MG tablet; Take 1 tablet (10 mg total) by mouth daily.  Dispense: 30 tablet; Refill: 2  3. Hyperlipidemia, unspecified hyperlipidemia type - Continue Atorvastatin as prescribed.  - Update lipid panel.  - Follow-up with primary provider as scheduled.  - Lipid Panel - atorvastatin (LIPITOR) 20 MG tablet; Take 1 tablet (20 mg total) by mouth daily.  Dispense: 30 tablet; Refill: 2  4. Prediabetes - Update hemoglobin A1c.  - Hemoglobin A1c   Patient was given the opportunity to ask questions.  Patient verbalized understanding of the plan and was able to repeat key elements of the plan. Patient was given clear instructions to go to Emergency Department or return to medical center if symptoms don't improve, worsen, or new problems develop.The patient verbalized understanding.   Orders Placed This Encounter  Procedures   Basic Metabolic Panel   Lipid Panel   Hemoglobin A1c     Requested Prescriptions   Signed  Prescriptions Disp Refills   amLODipine (NORVASC) 10 MG tablet 30 tablet 2    Sig: Take 1 tablet (10 mg total) by mouth daily.   atorvastatin (LIPITOR) 20 MG tablet 30 tablet 2    Sig: Take 1 tablet (20 mg total) by mouth daily.    Return in about 3 months (around 11/19/2022) for Follow-Up or next available chronic care mgmt.  Camillia Herter, NP

## 2022-08-21 LAB — LIPID PANEL
Chol/HDL Ratio: 6.3 ratio — ABNORMAL HIGH (ref 0.0–5.0)
Cholesterol, Total: 228 mg/dL — ABNORMAL HIGH (ref 100–199)
HDL: 36 mg/dL — ABNORMAL LOW (ref >39)
LDL Chol Calc (NIH): 158 mg/dL — ABNORMAL HIGH (ref 0–99)
Triglycerides: 182 mg/dL — ABNORMAL HIGH (ref 0–149)
VLDL Cholesterol Cal: 34 mg/dL (ref 5–40)

## 2022-08-21 LAB — BASIC METABOLIC PANEL
BUN/Creatinine Ratio: 13 (ref 9–20)
BUN: 9 mg/dL (ref 6–24)
CO2: 19 mmol/L — ABNORMAL LOW (ref 20–29)
Calcium: 9.5 mg/dL (ref 8.7–10.2)
Chloride: 103 mmol/L (ref 96–106)
Creatinine, Ser: 0.68 mg/dL — ABNORMAL LOW (ref 0.76–1.27)
Glucose: 100 mg/dL — ABNORMAL HIGH (ref 70–99)
Potassium: 4.2 mmol/L (ref 3.5–5.2)
Sodium: 138 mmol/L (ref 134–144)
eGFR: 110 mL/min/{1.73_m2} (ref >59)

## 2022-08-21 LAB — HEMOGLOBIN A1C
Est. average glucose Bld gHb Est-mCnc: 126 mg/dL
Hgb A1c MFr Bld: 6 % — ABNORMAL HIGH (ref 4.8–5.6)

## 2022-08-25 NOTE — Telephone Encounter (Signed)
Copied from [place] [id number]. Topic: General - Inquiry >> Aug 21, 2022 12:19 PM Rosanne Ashing P wrote: Reason for CRM: Pt called saying he would like to talk to the doctor or a nurse about his lab results.  He has some questions.  He was in earlier this week  CB#  [phone]  Will f/u with pt about labs on 10/3

## 2022-08-31 NOTE — Progress Notes (Deleted)
Patient ID: Darrell Conway, male    DOB: January 08, 1966  MRN: [id number]  CC:  Subjective: Darrell Conway is a 56 y.o. male who presents for    His concerns today include:   Weight loss  - Please call our office to give additional information regarding your cholesterol lab. If you were not fasting for at least 8 hours prior to checking your cholesterol schedule a fasting cholesterol lab only appointment soon. If you were fasting let us know so that I can update your plan of care concerning the same.   Patient Active Problem List   Diagnosis Date Noted   Chronic pain of left ankle 12/17/2021   S/P total knee arthroplasty, right 07/30/2021   History of total knee arthroplasty 07/30/2021   Essential hypertension 01/03/2021   Osteoarthritis of right knee 11/09/2020   Pain in joint of right knee 11/09/2020   Low back pain 10/15/2020   Irregular heart beat 02/14/2020   Personal history of COVID-19 02/13/2020   Amnesia 02/13/2020   Poor concentration 02/13/2020   Muscle pain 02/13/2020   Unilateral primary osteoarthritis, left hip 01/31/2020   Morbid obesity ([place]) 06/29/2019   Primary osteoarthritis of both knees 05/05/2019   Primary osteoarthritis of both hips    Multiple joint pain 04/26/2019   Pain in left foot 04/26/2019   Obstructive sleep apnea of adult 04/11/2019   Laryngopharyngeal reflux 04/11/2019     Current Outpatient Medications on File Prior to Visit  Medication Sig Dispense Refill   acetaminophen (TYLENOL) 325 MG tablet Take 1-2 tablets (325-650 mg total) by mouth every 6 (six) hours as needed for mild pain (pain score 1-3 or temp > 100.5).     amLODipine (NORVASC) 10 MG tablet Take 1 tablet (10 mg total) by mouth daily. 30 tablet 2   atorvastatin (LIPITOR) 20 MG tablet Take 1 tablet (20 mg total) by mouth daily. 30 tablet 2   celecoxib (CELEBREX) 200 MG capsule Take 1 capsule (200 mg total) by mouth 2 (two) times daily. 60 capsule 0   colchicine 0.6 MG tablet Take  by mouth.     cyclobenzaprine (FLEXERIL) 10 MG tablet Take 1 tablet (10 mg total) by mouth 2 (two) times daily as needed for muscle spasms. 20 tablet 0   diclofenac (VOLTAREN) 75 MG EC tablet Take 75 mg by mouth 2 (two) times daily.     docusate sodium (COLACE) 100 MG capsule Take 1 capsule (100 mg total) by mouth 2 (two) times daily. 10 capsule 0   EPINEPHrine 0.3 mg/0.3 mL IJ SOAJ injection Inject 0.3 mg into the muscle as needed for anaphylaxis. 2 each 0   hydrochlorothiazide (HYDRODIURIL) 12.5 MG tablet Take 1 tablet (12.5 mg total) by mouth daily. 30 tablet 0   methocarbamol (ROBAXIN) 500 MG tablet Take 1 tablet (500 mg total) by mouth every 6 (six) hours as needed for muscle spasms. 40 tablet 0   Multiple Vitamin (MULTIVITAMIN) capsule Take 1 capsule by mouth daily.     pantoprazole (PROTONIX) 40 MG tablet Take 1 tablet (40 mg total) by mouth 2 (two) times daily before a meal. 180 tablet 0   polyethylene glycol (MIRALAX / GLYCOLAX) 17 g packet Take 17 g by mouth daily as needed for mild constipation. 14 each 0   [DISCONTINUED] fluticasone (FLONASE) 50 MCG/ACT nasal spray Place 2 sprays into both nostrils daily. 16 g 6   [DISCONTINUED] orlistat (ALLI) 60 MG capsule Take 1 capsule (60 mg total) by mouth 3 (three)  times daily with meals. 90 capsule 1   No current facility-administered medications on file prior to visit.    Allergies  Allergen Reactions   Lisinopril Swelling    Still taking the medication at home.  Pt reports getting welts on skin around sides/stomach and elsewhere.  He also reports mild lip swell.    Social History   Socioeconomic History   Marital status: Married    Spouse name: Not on file   Number of children: Not on file   Years of education: Not on file   Highest education level: Not on file  Occupational History   Occupation: Clinical biochemist  Tobacco Use   Smoking status: Former    Packs/day: 0.75    Types: Cigarettes    Quit date: 2015    Years since  quitting: 8.7    Passive exposure: Past   Smokeless tobacco: Never  Vaping Use   Vaping Use: Never used  Substance and Sexual Activity   Alcohol use: Not Currently   Drug use: No   Sexual activity: Not Currently    Birth control/protection: None  Other Topics Concern   Not on file  Social History Narrative   Not on file   Social Determinants of Health   Financial Resource Strain: Not on file  Food Insecurity: Not on file  Transportation Needs: Not on file  Physical Activity: Not on file  Stress: Not on file  Social Connections: Not on file  Intimate Partner Violence: Not on file    Family History  Problem Relation Age of Onset   Rheum arthritis Mother    Hypertension Mother    Rheum arthritis Sister     Past Surgical History:  Procedure Laterality Date   HERNIA REPAIR     TOTAL KNEE ARTHROPLASTY Right 07/30/2021   Procedure: TOTAL KNEE ARTHROPLASTY;  Surgeon: Paralee Cancel, MD;  Location: [facility];  Service: Orthopedics;  Laterality: Right;    ROS: Review of Systems Negative except as stated above  PHYSICAL EXAM: There were no vitals taken for this visit.  Physical Exam  {male adult master:310786} {male adult master:310785}     Latest Ref Rng & Units 08/20/2022   11:26 AM 09/30/2021    3:14 PM 07/31/2021    3:22 AM  CMP  Glucose 70 - 99 mg/dL 100  77  140   BUN 6 - 24 mg/dL '9  8  10   '$ Creatinine 0.76 - 1.27 mg/dL 0.68  0.73  0.67   Sodium 134 - 144 mmol/L 138  142  134   Potassium 3.5 - 5.2 mmol/L 4.2  4.1  3.9   Chloride 96 - 106 mmol/L 103  102  102   CO2 20 - 29 mmol/L '19  22  24   '$ Calcium 8.7 - 10.2 mg/dL 9.5  9.8  9.1    Lipid Panel     Component Value Date/Time   CHOL 228 (H) 08/20/2022 1126   TRIG 182 (H) 08/20/2022 1126   HDL 36 (L) 08/20/2022 1126   CHOLHDL 6.3 (H) 08/20/2022 1126   LDLCALC 158 (H) 08/20/2022 1126   LDLDIRECT 193 (H) 10/02/2020 1444    CBC    Component Value Date/Time   WBC 16.9 (H) 08/01/2021 0314   RBC 4.46  08/01/2021 0314   HGB 13.0 08/01/2021 0314   HGB 15.2 10/02/2020 1444   HCT 38.0 (L) 08/01/2021 0314   HCT 45.7 10/02/2020 1444   PLT 223 08/01/2021 0314   PLT 285 10/02/2020  1444   MCV 85.2 08/01/2021 0314   MCV 85 10/02/2020 1444   MCH 29.1 08/01/2021 0314   MCHC 34.2 08/01/2021 0314   RDW 13.1 08/01/2021 0314   RDW 13.3 10/02/2020 1444   LYMPHSABS 1.3 10/02/2020 1444   MONOABS 0.6 01/06/2019 1454   EOSABS 0.0 10/02/2020 1444   BASOSABS 0.0 10/02/2020 1444    ASSESSMENT AND PLAN:  There are no diagnoses linked to this encounter.   Patient was given the opportunity to ask questions.  Patient verbalized understanding of the plan and was able to repeat key elements of the plan. Patient was given clear instructions to go to Emergency Department or return to medical center if symptoms don't improve, worsen, or new problems develop.The patient verbalized understanding.   No orders of the defined types were placed in this encounter.    Requested Prescriptions    No prescriptions requested or ordered in this encounter    No follow-ups on file.  Camillia Herter, NP

## 2022-09-04 ENCOUNTER — Ambulatory Visit: Payer: No Typology Code available for payment source | Admitting: Family

## 2022-11-20 NOTE — Progress Notes (Signed)
Patient ID: Darrell Conway, male    DOB: 1966/08/05  MRN: [id number]  CC: Chronic Care Management   Subjective: Darrell Conway is a 57 y.o. male who presents for chronic care management.   His concerns today include:  - Doing well on chronic care medications, no issues/concerns. Denies red flag symptoms.  - Noticed knot of central chest a couple months while taking a shower. Since then has increased in size some. Denies red flag symptoms. No known family history of breast cancer.  - History of right total knee arthroplasty completed by surgeon at [facility]. Reports right knee still swollen since surgery. Now having left knee and left hip pain due to overuse of right lower extremity is limited. States he does not want to return to [facility] because of an unpleasant patient experience and requests referral to new orthopedist.  - Wants to begin Phentermine. States he feels that losing weight will help with knee and hip pain.  - Fungus of bilateral toenails.   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   History of total knee arthroplasty 07/30/2021   Essential hypertension 01/03/2021   Osteoarthritis of right knee 11/09/2020   Pain in joint of right knee 11/09/2020   Low back pain 10/15/2020   Irregular heart beat 02/14/2020   Personal history of COVID-19 02/13/2020   Amnesia 02/13/2020   Poor concentration 02/13/2020   Muscle pain 02/13/2020   Unilateral primary osteoarthritis, left hip 01/31/2020   Osteoarthritis of left hip 01/31/2020   Morbid obesity ([place]) 06/29/2019   Primary osteoarthritis of both knees 05/05/2019   Primary osteoarthritis of both hips    Multiple joint pain 04/26/2019   Pain in left foot 04/26/2019   Obstructive sleep apnea of adult 04/11/2019   Laryngopharyngeal  reflux 04/11/2019     Current Outpatient Medications on File Prior to Visit  Medication Sig Dispense Refill   acetaminophen (TYLENOL) 325 MG tablet Take 1-2 tablets (325-650 mg total) by mouth every 6 (six) hours as needed for mild pain (pain score 1-3 or temp > 100.5).     aspirin 81 MG chewable tablet Chew 81 mg by mouth daily.     colchicine 0.6 MG tablet Take by mouth.     EPINEPHrine 0.3 mg/0.3 mL IJ SOAJ injection Inject 0.3 mg into the muscle as needed for anaphylaxis. 2 each 0   hydrochlorothiazide (HYDRODIURIL) 12.5 MG tablet Take 1 tablet (12.5 mg total) by mouth daily. 30 tablet 0   Multiple Vitamin (MULTIVITAMIN) capsule Take 1 capsule by mouth daily.     pantoprazole (PROTONIX) 40 MG tablet Take 1 tablet (40 mg total) by mouth 2 (two) times daily before a meal. 180 tablet 0   [DISCONTINUED] fluticasone (FLONASE) 50 MCG/ACT nasal spray Place 2 sprays into both nostrils daily. 16 g 6   [DISCONTINUED] orlistat (ALLI) 60 MG capsule Take 1 capsule (60 mg total) by mouth 3 (three) times daily with meals. 90 capsule 1   No current facility-administered medications on file prior to visit.    Allergies  Allergen Reactions   Lisinopril Swelling and Rash    Still taking the medication at home.  Pt reports getting welts on skin around sides/stomach and elsewhere.  He also reports mild lip swell.    Social History   Socioeconomic History  Marital status: Married    Spouse name: Not on file   Number of children: Not on file   Years of education: Not on file   Highest education level: Not on file  Occupational History   Occupation: Clinical biochemist  Tobacco Use   Smoking status: Former    Packs/day: 0.75    Types: Cigarettes    Quit date: 2015    Years since quitting: 9.0    Passive exposure: Past   Smokeless tobacco: Never  Vaping Use   Vaping Use: Never used  Substance and Sexual Activity   Alcohol use: Not Currently   Drug use: No   Sexual activity: Not Currently     Birth control/protection: None  Other Topics Concern   Not on file  Social History Narrative   Not on file   Social Determinants of Health   Financial Resource Strain: Not on file  Food Insecurity: Not on file  Transportation Needs: Not on file  Physical Activity: Not on file  Stress: Not on file  Social Connections: Not on file  Intimate Partner Violence: Not on file    Family History  Problem Relation Age of Onset   Rheum arthritis Mother    Hypertension Mother    Rheum arthritis Sister     Past Surgical History:  Procedure Laterality Date   HERNIA REPAIR     TOTAL KNEE ARTHROPLASTY Right 07/30/2021   Procedure: TOTAL KNEE ARTHROPLASTY;  Surgeon: Paralee Cancel, MD;  Location: [facility];  Service: Orthopedics;  Laterality: Right;    ROS: Review of Systems Negative except as stated above  PHYSICAL EXAM: BP 130/85 (BP Location: Left Arm, Patient Position: Sitting, Cuff Size: Large)   Pulse 93   Temp 98.3 F (36.8 C)   Resp 16   Ht 6' 0.01" (1.829 m)   Wt 297 lb (134.7 kg)   SpO2 94%   BMI 40.27 kg/m   Physical Exam HENT:     Head: Normocephalic and atraumatic.  Eyes:     Extraocular Movements: Extraocular movements intact.     Conjunctiva/sclera: Conjunctivae normal.     Pupils: Pupils are equal, round, and reactive to light.  Cardiovascular:     Rate and Rhythm: Normal rate and regular rhythm.     Pulses: Normal pulses.     Heart sounds: Normal heart sounds.  Pulmonary:     Effort: Pulmonary effort is normal.     Breath sounds: Normal breath sounds.  Chest:     Chest wall: Mass present.  Musculoskeletal:     Cervical back: Normal range of motion and neck supple.  Feet:     Comments: Patient declined.  Neurological:     General: No focal deficit present.     Mental Status: He is alert and oriented to person, place, and time.  Psychiatric:        Mood and Affect: Mood normal.        Behavior: Behavior normal.     ASSESSMENT AND PLAN: 1. Primary  hypertension - Continue Amlodipine as prescribed.  - Counseled on blood pressure goal of less than 130/80, low-sodium, DASH diet, medication compliance, and 150 minutes of moderate intensity exercise per week as tolerated. Counseled on medication adherence and adverse effects. - Follow-up with primary provider in 3 months or sooner if needed.  - amLODipine (NORVASC) 10 MG tablet; Take 1 tablet (10 mg total) by mouth daily.  Dispense: 30 tablet; Refill: 2  2. Hyperlipidemia, unspecified hyperlipidemia type - Continue Atorvastatin  as prescribed.  - Update lipid panel. Patient intends to return tomorrow for fasting lipid panel. - Follow-up with primary provider in 3 months or sooner if needed.  - Lipid panel; Future - atorvastatin (LIPITOR) 20 MG tablet; Take 1 tablet (20 mg total) by mouth daily.  Dispense: 30 tablet; Refill: 2  3. Nodule of chest wall - Mammogram for further evaluation.  - MM Digital Screening; Future  4. Encounter for weight management 5. Class 3 severe obesity with serious comorbidity and body mass index (BMI) of 40.0 to 44.9 in adult, unspecified obesity type (HCC) - Phentermine as prescribed. Counseled on medication adherence and adverse effects.  - I did check the [facility] prescription drug database and found no frequent prescribers of opiates or evidence of aberrant behavior. - Counseled on low-sodium DASH diet and 150 minutes of moderate intensity exercise per week as tolerated to assist with weight management.  - Follow-up with primary provider in 4 weeks or sooner if needed. - phentermine 15 MG capsule; Take 1 capsule (15 mg total) by mouth every morning.  Dispense: 30 capsule; Refill: 0  6. History of total knee arthroplasty, right 7. Chronic left hip pain 8. Chronic pain of left knee - Referral to Orthopedic Surgery for further evaluation/management.  - Ambulatory referral to Orthopedic Surgery  9. Onychomycosis of toenail - Referral to Podiatry for  further evaluation/management.  - Ambulatory referral to Podiatry   Patient was given the opportunity to ask questions.  Patient verbalized understanding of the plan and was able to repeat key elements of the plan. Patient was given clear instructions to go to Emergency Department or return to medical center if symptoms don't improve, worsen, or new problems develop.The patient verbalized understanding.   Orders Placed This Encounter  Procedures   MM Digital Screening   Lipid panel   Ambulatory referral to Podiatry   Ambulatory referral to Orthopedic Surgery     Requested Prescriptions   Signed Prescriptions Disp Refills   amLODipine (NORVASC) 10 MG tablet 30 tablet 2    Sig: Take 1 tablet (10 mg total) by mouth daily.   atorvastatin (LIPITOR) 20 MG tablet 30 tablet 2    Sig: Take 1 tablet (20 mg total) by mouth daily.   phentermine 15 MG capsule 30 capsule 0    Sig: Take 1 capsule (15 mg total) by mouth every morning.    Return in about 3 months (around 02/24/2023) for Follow-Up or next available chronic care mgmt and 4 weeks weight check.  Camillia Herter, NP

## 2022-11-25 ENCOUNTER — Other Ambulatory Visit (HOSPITAL_COMMUNITY): Payer: Self-pay

## 2022-11-25 ENCOUNTER — Ambulatory Visit: Payer: No Typology Code available for payment source | Admitting: Family

## 2022-11-25 ENCOUNTER — Encounter: Payer: Self-pay | Admitting: Family

## 2022-11-25 VITALS — BP 130/85 | HR 93 | Temp 98.3°F | Resp 16 | Ht 72.01 in | Wt 297.0 lb

## 2022-11-25 DIAGNOSIS — Z96651 Presence of right artificial knee joint: Secondary | ICD-10-CM

## 2022-11-25 DIAGNOSIS — I10 Essential (primary) hypertension: Secondary | ICD-10-CM

## 2022-11-25 DIAGNOSIS — E785 Hyperlipidemia, unspecified: Secondary | ICD-10-CM | POA: Diagnosis not present

## 2022-11-25 DIAGNOSIS — Z7689 Persons encountering health services in other specified circumstances: Secondary | ICD-10-CM

## 2022-11-25 DIAGNOSIS — M25562 Pain in left knee: Secondary | ICD-10-CM | POA: Diagnosis not present

## 2022-11-25 DIAGNOSIS — R222 Localized swelling, mass and lump, trunk: Secondary | ICD-10-CM

## 2022-11-25 DIAGNOSIS — B351 Tinea unguium: Secondary | ICD-10-CM

## 2022-11-25 DIAGNOSIS — Z6841 Body Mass Index (BMI) 40.0 and over, adult: Secondary | ICD-10-CM

## 2022-11-25 DIAGNOSIS — G8929 Other chronic pain: Secondary | ICD-10-CM

## 2022-11-25 DIAGNOSIS — M25552 Pain in left hip: Secondary | ICD-10-CM

## 2022-11-25 MED ORDER — AMLODIPINE BESYLATE 10 MG PO TABS
10.0000 mg | ORAL_TABLET | Freq: Every day | ORAL | 2 refills | Status: DC
  Filled 2022-11-25: qty 30, 30d supply, fill #0

## 2022-11-25 MED ORDER — PHENTERMINE HCL 15 MG PO CAPS
15.0000 mg | ORAL_CAPSULE | ORAL | 0 refills | Status: DC
  Filled 2022-11-25: qty 30, 30d supply, fill #0

## 2022-11-25 MED ORDER — ATORVASTATIN CALCIUM 20 MG PO TABS
20.0000 mg | ORAL_TABLET | Freq: Every day | ORAL | 2 refills | Status: DC
  Filled 2022-11-25: qty 30, 30d supply, fill #0

## 2022-11-25 NOTE — Progress Notes (Signed)
Pt presents for chronic care management   .pt has knot in middle of chest near right breast

## 2022-11-26 ENCOUNTER — Other Ambulatory Visit: Payer: Self-pay

## 2022-11-26 ENCOUNTER — Other Ambulatory Visit (HOSPITAL_COMMUNITY): Payer: Self-pay

## 2022-11-26 DIAGNOSIS — E785 Hyperlipidemia, unspecified: Secondary | ICD-10-CM

## 2022-11-27 ENCOUNTER — Other Ambulatory Visit: Payer: Self-pay | Admitting: Family

## 2022-11-27 ENCOUNTER — Telehealth: Payer: Self-pay

## 2022-11-27 DIAGNOSIS — E785 Hyperlipidemia, unspecified: Secondary | ICD-10-CM

## 2022-11-27 LAB — LIPID PANEL
Chol/HDL Ratio: 3.5 ratio (ref 0.0–5.0)
Cholesterol, Total: 140 mg/dL (ref 100–199)
HDL: 40 mg/dL (ref >39)
LDL Chol Calc (NIH): 81 mg/dL (ref 0–99)
Triglycerides: 100 mg/dL (ref 0–149)
VLDL Cholesterol Cal: 19 mg/dL (ref 5–40)

## 2022-12-02 ENCOUNTER — Ambulatory Visit: Payer: No Typology Code available for payment source | Admitting: Podiatry

## 2022-12-02 DIAGNOSIS — M79675 Pain in left toe(s): Secondary | ICD-10-CM | POA: Diagnosis not present

## 2022-12-02 DIAGNOSIS — M79674 Pain in right toe(s): Secondary | ICD-10-CM

## 2022-12-02 DIAGNOSIS — B351 Tinea unguium: Secondary | ICD-10-CM | POA: Diagnosis not present

## 2022-12-02 DIAGNOSIS — Z79899 Other long term (current) drug therapy: Secondary | ICD-10-CM

## 2022-12-02 NOTE — Progress Notes (Signed)
Subjective: Chief Complaint  Patient presents with   Foot Problem    Onychomycosis, has been going on for years, no prior treatment, bilateral feet     57 year old male presents the office with above concerns.  The department of his nails are causing discomfort as elongated as well.  Cinoman for many years no recent treatment.    No tobacoo use No etoh   Objective: AAO x3, NAD DP/PT pulses palpable bilaterally, CRT less than 3 seconds Nails are hypertrophic, dystrophic, brittle, discolored, elongated 10. No surrounding redness or drainage. Tenderness nails 1-5 bilaterally. No open lesions or pre-ulcerative lesions are identified today.  No pain with calf compression, swelling, warmth, erythema  Assessment: Symptomatic onychomycosis  Plan: -All treatment options discussed with the patient including all alternatives, risks, complications.  -Nails sharply debrided x 10 without any complications or bleeding. -Discussed treatment options for nail fungus including oral, topical as well as alternative treatments.  At this time the patient wants to proceed with oral Lamisil.  We discussed side effects of the medication and success rates.  We will check a CBC and LFT prior to starting the medication.  Once I receive the results of this I will then call the medication in.  -Patient encouraged to call the office with any questions, concerns, change in symptoms.   Trula Slade DPM

## 2022-12-02 NOTE — Progress Notes (Deleted)
12/04/22- 38 yoM former smoker for sleep evaluation with concern of OSA Medical problem list includes HTN, LPR/ GERD, Osteoarthritis, Morbid Obesity, Gout, HST 06/15/19- AHI 17.3/ hr, deaturation to 79%, body weight 300 lbs CPAP/ [employer] [facility]- Epworth score Body weight today- Covid vax- Flu vax-

## 2022-12-02 NOTE — Patient Instructions (Signed)
Terbinafine Tablets What is this medication? TERBINAFINE (TER bin a feen) treats fungal infections of the nails. It belongs to a group of medications called antifungals. It will not treat infections caused by bacteria or viruses. This medicine may be used for other purposes; ask your health care provider or pharmacist if you have questions. COMMON BRAND NAME(S): Lamisil, Terbinex What should I tell my care team before I take this medication? They need to know if you have any of these conditions: Liver disease An unusual or allergic reaction to terbinafine, other medications, foods, dyes, or preservatives Pregnant or trying to get pregnant Breast-feeding How should I use this medication? Take this medication by mouth with water. Take it as directed on the prescription label at the same time every day. You can take it with or without food. If it upsets your stomach, take it with food. Keep taking it unless your care team tells you to stop. A special MedGuide will be given to you by the pharmacist with each prescription and refill. Be sure to read this information carefully each time. Talk to your care team regarding the use of this medication in children. Special care may be needed. Overdosage: If you think you have taken too much of this medicine contact a poison control center or emergency room at once. NOTE: This medicine is only for you. Do not share this medicine with others. What if I miss a dose? If you miss a dose, take it as soon as you can unless it is more than 4 hours late. If it is more than 4 hours late, skip the missed dose. Take the next dose at the normal time. What may interact with this medication? Do not take this medication with any of the following: Pimozide Thioridazine This medication may also interact with the following: Beta blockers Caffeine Certain medications for mental health conditions Cimetidine Cyclosporine Medications for fungal infections like fluconazole  and ketoconazole Medications for irregular heartbeat like amiodarone, flecainide and propafenone Rifampin Warfarin This list may not describe all possible interactions. Give your health care provider a list of all the medicines, herbs, non-prescription drugs, or dietary supplements you use. Also tell them if you smoke, drink alcohol, or use illegal drugs. Some items may interact with your medicine. What should I watch for while using this medication? Visit your care team for regular checks on your progress. You may need blood work while you are taking this medication. It may be some time before you see the benefit from this medication. This medication may cause serious skin reactions. They can happen weeks to months after starting the medication. Contact your care team right away if you notice fevers or flu-like symptoms with a rash. The rash may be red or purple and then turn into blisters or peeling of the skin. Or, you might notice a red rash with swelling of the face, lips or lymph nodes in your neck or under your arms. This medication can make you more sensitive to the sun. Keep out of the sun, If you cannot avoid being in the sun, wear protective clothing and sunscreen. Do not use sun lamps or tanning beds/booths. What side effects may I notice from receiving this medication? Side effects that you should report to your care team as soon as possible: Allergic reactions--skin rash, itching, hives, swelling of the face, lips, tongue, or throat Change in sense of smell Change in taste Infection--fever, chills, cough, or sore throat Liver injury--right upper belly pain, loss of appetite, nausea,   light-colored stool, dark yellow or brown urine, yellowing skin or eyes, unusual weakness or fatigue Low red blood cell level--unusual weakness or fatigue, dizziness, headache, trouble breathing Lupus-like syndrome--joint pain, swelling, or stiffness, butterfly-shaped rash on the face, rashes that get worse  in the sun, fever, unusual weakness or fatigue Rash, fever, and swollen lymph nodes Redness, blistering, peeling, or loosening of the skin, including inside the mouth Unusual bruising or bleeding Worsening mood, feelings of depression Side effects that usually do not require medical attention (report to your care team if they continue or are bothersome): Diarrhea Gas Headache Nausea Stomach pain Upset stomach This list may not describe all possible side effects. Call your doctor for medical advice about side effects. You may report side effects to FDA at 1-800-FDA-1088. Where should I keep my medication? Keep out of the reach of children and pets. Store between 20 and 25 degrees C (68 and 77 degrees F). Protect from light. Get rid of any unused medication after the expiration date. To get rid of medications that are no longer needed or have expired: Take the medication to a medication take-back program. Check with your pharmacy or law enforcement to find a location. If you cannot return the medication, check the label or package insert to see if the medication should be thrown out in the garbage or flushed down the toilet. If you are not sure, ask your care team. If it is safe to put it in the trash, take the medication out of the container. Mix the medication with cat litter, dirt, coffee grounds, or other unwanted substance. Seal the mixture in a bag or container. Put it in the trash. NOTE: This sheet is a summary. It may not cover all possible information. If you have questions about this medicine, talk to your doctor, pharmacist, or health care provider.  2023 Elsevier/Gold Standard (2021-06-04 00:00:00)  

## 2022-12-03 LAB — CBC WITH DIFFERENTIAL/PLATELET
Basophils Absolute: 0.1 10*3/uL (ref 0.0–0.2)
Basos: 1 %
EOS (ABSOLUTE): 0.2 10*3/uL (ref 0.0–0.4)
Eos: 2 %
Hematocrit: 45.5 % (ref 37.5–51.0)
Hemoglobin: 15.4 g/dL (ref 13.0–17.7)
Immature Grans (Abs): 0 10*3/uL (ref 0.0–0.1)
Immature Granulocytes: 0 %
Lymphocytes Absolute: 1.9 10*3/uL (ref 0.7–3.1)
Lymphs: 31 %
MCH: 28.7 pg (ref 26.6–33.0)
MCHC: 33.8 g/dL (ref 31.5–35.7)
MCV: 85 fL (ref 79–97)
Monocytes Absolute: 0.7 10*3/uL (ref 0.1–0.9)
Monocytes: 11 %
Neutrophils Absolute: 3.4 10*3/uL (ref 1.4–7.0)
Neutrophils: 55 %
Platelets: 271 10*3/uL (ref 150–450)
RBC: 5.36 x10E6/uL (ref 4.14–5.80)
RDW: 13.2 % (ref 11.6–15.4)
WBC: 6.2 10*3/uL (ref 3.4–10.8)

## 2022-12-03 LAB — HEPATIC FUNCTION PANEL
ALT: 45 IU/L — ABNORMAL HIGH (ref 0–44)
AST: 36 IU/L (ref 0–40)
Albumin: 4.7 g/dL (ref 3.8–4.9)
Alkaline Phosphatase: 120 IU/L (ref 44–121)
Bilirubin Total: 0.7 mg/dL (ref 0.0–1.2)
Bilirubin, Direct: 0.16 mg/dL (ref 0.00–0.40)
Total Protein: 7.6 g/dL (ref 6.0–8.5)

## 2022-12-04 ENCOUNTER — Institutional Professional Consult (permissible substitution): Payer: No Typology Code available for payment source | Admitting: Internal Medicine

## 2022-12-04 ENCOUNTER — Other Ambulatory Visit: Payer: Self-pay | Admitting: Podiatry

## 2022-12-04 ENCOUNTER — Other Ambulatory Visit (HOSPITAL_COMMUNITY): Payer: Self-pay

## 2022-12-04 MED ORDER — CICLOPIROX 8 % EX SOLN: Freq: Every day | CUTANEOUS | 0 refills | Status: DC

## 2022-12-13 ENCOUNTER — Other Ambulatory Visit (HOSPITAL_COMMUNITY): Payer: Self-pay

## 2022-12-14 ENCOUNTER — Emergency Department (HOSPITAL_COMMUNITY)
Admission: EM | Admit: 2022-12-14 | Discharge: 2022-12-14 | Disposition: A | Discharge status: Home / Self Care | Payer: No Typology Code available for payment source | Attending: Emergency Medicine | Admitting: Emergency Medicine

## 2022-12-14 ENCOUNTER — Emergency Department (HOSPITAL_COMMUNITY): Payer: No Typology Code available for payment source

## 2022-12-14 ENCOUNTER — Other Ambulatory Visit: Payer: Self-pay

## 2022-12-14 ENCOUNTER — Encounter (HOSPITAL_COMMUNITY): Payer: Self-pay

## 2022-12-14 DIAGNOSIS — M79601 Pain in right arm: Secondary | ICD-10-CM | POA: Insufficient documentation

## 2022-12-14 DIAGNOSIS — R079 Chest pain, unspecified: Secondary | ICD-10-CM | POA: Diagnosis present

## 2022-12-14 DIAGNOSIS — R0602 Shortness of breath: Secondary | ICD-10-CM | POA: Diagnosis not present

## 2022-12-14 DIAGNOSIS — B349 Viral infection, unspecified: Secondary | ICD-10-CM | POA: Diagnosis not present

## 2022-12-14 DIAGNOSIS — Z79899 Other long term (current) drug therapy: Secondary | ICD-10-CM | POA: Diagnosis not present

## 2022-12-14 DIAGNOSIS — Z1152 Encounter for screening for COVID-19: Secondary | ICD-10-CM | POA: Diagnosis not present

## 2022-12-14 DIAGNOSIS — I10 Essential (primary) hypertension: Secondary | ICD-10-CM | POA: Diagnosis not present

## 2022-12-14 DIAGNOSIS — K1379 Other lesions of oral mucosa: Secondary | ICD-10-CM | POA: Diagnosis not present

## 2022-12-14 DIAGNOSIS — Z7982 Long term (current) use of aspirin: Secondary | ICD-10-CM | POA: Insufficient documentation

## 2022-12-14 DIAGNOSIS — R61 Generalized hyperhidrosis: Secondary | ICD-10-CM | POA: Diagnosis not present

## 2022-12-14 DIAGNOSIS — D72829 Elevated white blood cell count, unspecified: Secondary | ICD-10-CM | POA: Diagnosis not present

## 2022-12-14 DIAGNOSIS — R0600 Dyspnea, unspecified: Secondary | ICD-10-CM | POA: Insufficient documentation

## 2022-12-14 LAB — BASIC METABOLIC PANEL
Anion gap: 8 (ref 5–15)
BUN: 9 mg/dL (ref 6–20)
CO2: 23 mmol/L (ref 22–32)
Calcium: 9.2 mg/dL (ref 8.9–10.3)
Chloride: 104 mmol/L (ref 98–111)
Creatinine, Ser: 0.69 mg/dL (ref 0.61–1.24)
GFR, Estimated: >60 mL/min (ref >60)
Glucose, Bld: 103 mg/dL — ABNORMAL HIGH (ref 70–99)
Potassium: 4.3 mmol/L (ref 3.5–5.1)
Sodium: 135 mmol/L (ref 135–145)

## 2022-12-14 LAB — CBC WITH DIFFERENTIAL/PLATELET
Abs Immature Granulocytes: 0.06 10*3/uL (ref 0.00–0.07)
Basophils Absolute: 0.1 10*3/uL (ref 0.0–0.1)
Basophils Relative: 0 %
Eosinophils Absolute: 0.2 10*3/uL (ref 0.0–0.5)
Eosinophils Relative: 1 %
HCT: 45.1 % (ref 39.0–52.0)
Hemoglobin: 15.3 g/dL (ref 13.0–17.0)
Immature Granulocytes: 1 %
Lymphocytes Relative: 10 %
Lymphs Abs: 1.3 10*3/uL (ref 0.7–4.0)
MCH: 28.8 pg (ref 26.0–34.0)
MCHC: 33.9 g/dL (ref 30.0–36.0)
MCV: 84.8 fL (ref 80.0–100.0)
Monocytes Absolute: 1.2 10*3/uL — ABNORMAL HIGH (ref 0.1–1.0)
Monocytes Relative: 10 %
Neutro Abs: 9.9 10*3/uL — ABNORMAL HIGH (ref 1.7–7.7)
Neutrophils Relative %: 78 %
Platelets: 211 10*3/uL (ref 150–400)
RBC: 5.32 MIL/uL (ref 4.22–5.81)
RDW: 13.7 % (ref 11.5–15.5)
WBC: 12.7 10*3/uL — ABNORMAL HIGH (ref 4.0–10.5)
nRBC: 0 % (ref 0.0–0.2)

## 2022-12-14 LAB — TROPONIN I (HIGH SENSITIVITY)
Troponin I (High Sensitivity): 5 ng/L (ref <18)
Troponin I (High Sensitivity): 4 ng/L (ref <18)

## 2022-12-14 LAB — RESP PANEL BY RT-PCR (RSV, FLU A&B, COVID)  RVPGX2
Influenza A by PCR: NEGATIVE
Influenza B by PCR: NEGATIVE
Resp Syncytial Virus by PCR: NEGATIVE
SARS Coronavirus 2 by RT PCR: NEGATIVE

## 2022-12-14 MED ORDER — KETOROLAC TROMETHAMINE 30 MG/ML IJ SOLN
30.0000 mg | Freq: Once | INTRAMUSCULAR | Status: AC
  Administered 2022-12-14: 30 mg via INTRAVENOUS
  Filled 2022-12-14: qty 1

## 2022-12-14 MED ORDER — ASPIRIN 81 MG PO CHEW
324.0000 mg | CHEWABLE_TABLET | Freq: Once | ORAL | Status: AC
  Administered 2022-12-14: 324 mg via ORAL
  Filled 2022-12-14: qty 4

## 2022-12-14 MED ORDER — DIPHENHYDRAMINE HCL 50 MG/ML IJ SOLN
25.0000 mg | Freq: Once | INTRAMUSCULAR | Status: AC
  Administered 2022-12-14: 25 mg via INTRAVENOUS
  Filled 2022-12-14: qty 1

## 2022-12-14 MED ORDER — PREDNISONE 50 MG PO TABS: 50.0000 mg | ORAL_TABLET | Freq: Every day | ORAL | 0 refills | Status: DC

## 2022-12-14 MED ORDER — METHYLPREDNISOLONE SODIUM SUCC 125 MG IJ SOLR
125.0000 mg | Freq: Once | INTRAMUSCULAR | Status: AC
  Administered 2022-12-14: 125 mg via INTRAVENOUS
  Filled 2022-12-14: qty 2

## 2022-12-14 NOTE — ED Notes (Signed)
ED Provider at bedside. 

## 2022-12-14 NOTE — ED Provider Notes (Signed)
[facility] Provider Note   CSN: [id number] Arrival date & time: 12/14/22  0421     History  Chief Complaint  Patient presents with   chest pain/SHOB    Darrell Conway is a 57 y.o. male.  The history is provided by the patient and the spouse.  He has history of hypertension, hyperlipidemia, gout, rheumatoid arthritis, GERD and comes in because of right-sided chest pain and right arm pain.  He states he felt fine when he went to bed and woke up with a or type of pain in the right side of his chest radiating down his right arm.  The chest pain is resolved but the arm pain persists.  He is also complaining of some dyspnea and diaphoresis.  He denies nausea or vomiting.  He he has had chills at home and has had a slight cough.  He denies any sick contacts.  He is a non-smoker and there is no family history of premature coronary atherosclerosis.   Home Medications Prior to Admission medications   Medication Sig Start Date End Date Taking? Authorizing Provider  amLODipine (NORVASC) 10 MG tablet Take 1 tablet (10 mg total) by mouth daily. 11/25/22 02/23/23 Yes Minette Brine, Amy J, NP  ciclopirox (PENLAC) 8 % solution Apply topically at bedtime. Apply over nail and surrounding skin. Apply daily over previous coat. After seven (7) days, may remove with alcohol and continue cycle. 12/04/22   Trula Slade, DPM  acetaminophen (TYLENOL) 325 MG tablet Take 1-2 tablets (325-650 mg total) by mouth every 6 (six) hours as needed for mild pain (pain score 1-3 or temp > 100.5). 08/02/21   Irving Copas, PA-C  aspirin 81 MG chewable tablet Chew 81 mg by mouth daily.    [provider]  atorvastatin (LIPITOR) 20 MG tablet Take 1 tablet (20 mg total) by mouth daily. 11/25/22 02/23/23  Camillia Herter, NP  colchicine 0.6 MG tablet Take by mouth. 05/23/22   [provider]  EPINEPHrine 0.3 mg/0.3 mL IJ SOAJ injection Inject 0.3 mg into the muscle as  needed for anaphylaxis. 09/30/21   Camillia Herter, NP  hydrochlorothiazide (HYDRODIURIL) 12.5 MG tablet Take 1 tablet (12.5 mg total) by mouth daily. 12/07/21 01/06/22  Camillia Herter, NP  Multiple Vitamin (MULTIVITAMIN) capsule Take 1 capsule by mouth daily.    [provider]  pantoprazole (PROTONIX) 40 MG tablet Take 1 tablet (40 mg total) by mouth 2 (two) times daily before a meal. 04/22/22 07/21/22  Camillia Herter, NP  phentermine 15 MG capsule Take 1 capsule (15 mg total) by mouth every morning. 11/25/22   Camillia Herter, NP  fluticasone (FLONASE) 50 MCG/ACT nasal spray Place 2 sprays into both nostrils daily. 01/06/20 07/29/20  Nicolette Bang, MD  orlistat (ALLI) 60 MG capsule Take 1 capsule (60 mg total) by mouth 3 (three) times daily with meals. 02/29/20 07/29/20  Nicolette Bang, MD      Allergies    Lisinopril    Review of Systems   Review of Systems  All other systems reviewed and are negative.   Physical Exam Updated Vital Signs BP (!) 166/97 (BP Location: Left Arm)   Pulse (!) 103   Temp (!) 100.6 F (38.1 C) (Oral)   Resp (!) 22   Ht 6' (1.829 m)   Wt 127 kg   SpO2 96%   BMI 37.97 kg/m  Physical Exam Vitals and nursing note reviewed.  57 year old male, resting comfortably and in no acute distress. Vital signs are significant for elevated blood pressure and mildly elevated heart rate and respiratory rate and mildly elevated temperature. Oxygen saturation is 96%, which is normal. Head is normocephalic and atraumatic. PERRLA, EOMI. Oropharynx is clear.  There is mild to moderate edema of the uvula.  There is no pooling of secretions. Neck is nontender and supple without adenopathy or JVD. Back is nontender and there is no CVA tenderness. Lungs are clear without rales, wheezes, or rhonchi. Chest is nontender. Heart has regular rate and rhythm without murmur. Abdomen is soft, flat, nontender. Extremities have trace edema, full range of motion is  present. Skin is warm and dry without rash. Neurologic: Awake and alert, cranial nerves are intact, moves all extremities equally.  ED Results / Procedures / Treatments   Labs (all labs ordered are listed, but only abnormal results are displayed) Labs Reviewed  BASIC METABOLIC PANEL - Abnormal; Notable for the following components:      Result Value   Glucose, Bld 103 (*)    All other components within normal limits  CBC WITH DIFFERENTIAL/PLATELET - Abnormal; Notable for the following components:   WBC 12.7 (*)    Neutro Abs 9.9 (*)    Monocytes Absolute 1.2 (*)    All other components within normal limits  RESP PANEL BY RT-PCR (RSV, FLU A&B, COVID)  RVPGX2  TROPONIN I (HIGH SENSITIVITY)  TROPONIN I (HIGH SENSITIVITY)    EKG EKG Interpretation  Date/Time:  Sunday December 14 2022 04:34:06 EST Ventricular Rate:  102 PR Interval:  178 QRS Duration: 90 QT Interval:  326 QTC Calculation: 425 R Axis:   -21 Text Interpretation: Sinus tachycardia Borderline left axis deviation RSR' in V1 or V2, probably normal variant Borderline T wave abnormalities When compared with ECG of 01/27/2018, No significant change was found Confirmed by Delora Fuel ([id number]) on 12/14/2022 6:04:07 AM  Radiology DG Chest Port 1 View  Result Date: 12/14/2022 CLINICAL DATA:  Chest pain EXAM: PORTABLE CHEST 1 VIEW COMPARISON:  01/27/2018 FINDINGS: Low volume chest with streaky type density at the left more than right base. Normal heart size and mediastinal contours accounting for rightward rotation. Artifact from EKG leads. No effusion or pneumothorax. IMPRESSION: Low volume chest with atelectasis at the lung bases. Electronically Signed   By: Jorje Guild M.D.   On: 12/14/2022 05:24    Procedures Procedures  Cardiac monitor shows normal sinus rhythm, per my interpretation.  Medications Ordered in ED Medications  aspirin chewable tablet 324 mg (324 mg Oral Given 12/14/22 0454)  diphenhydrAMINE (BENADRYL)  injection 25 mg (25 mg Intravenous Given 12/14/22 0453)  ketorolac (TORADOL) 30 MG/ML injection 30 mg (30 mg Intravenous Given 12/14/22 0559)  methylPREDNISolone sodium succinate (SOLU-MEDROL) 125 mg/2 mL injection 125 mg (125 mg Intravenous Given 12/14/22 5366)    ED Course/ Medical Decision Making/ A&P                             Medical Decision Making Amount and/or Complexity of Data Reviewed Labs: ordered. Radiology: ordered.  Risk OTC drugs. Prescription drug management.   Chest pain and arm pain in the setting of fever.  This most likely represents a viral illness such as influenza, COVID-19, RSV.  Consider pneumonia.  I have low index of suspicion for ACS, but feel that it is still appropriate to check electrocardiogram and serial troponins.  Doubt pulmonary embolism,  dissecting aortic aneurysm.  Uvula edema likely related to underlying viral syndrome, consider allergic reaction.  No evidence of angioedema, no evidence of airway obstruction.  I have ordered laboratory workup of CBC, basic metabolic panel, troponin.  I have ordered an electrocardiogram and chest x-ray.  I have ordered a dose of aspirin and a dose of diphenhydramine.  I have ordered respiratory pathogen panel.  Chest x-ray shows no evidence of pneumonia, no acute process.  Have independently viewed the image, and agree with the radiologist's interpretation.  I have reviewed and interpreted his electrocardiogram, and my interpretation is borderline T wave changes which are unchanged from prior.  I have reviewed and interpreted his laboratory test, and my interpretation is mild elevation of random glucose, normal troponin x 1, mild leukocytosis which is nonspecific.  Repeat troponin is pending.  Respiratory pathogen panel is negative for influenza, COVID-19, RSV.  I have ordered a dose of ketorolac for pain.  Patient was reevaluated, he is feeling significantly improved.  I feel it is appropriate to put him on a course of  steroids for his uvula edema and I have ordered a dose of methylprednisolone.  Case is signed out to Dr. Zenia Resides pending repeat troponin.  I anticipate discharging him on a short course of oral prednisone.  Final Clinical Impression(s) / ED Diagnoses Final diagnoses:  Nonspecific chest pain  Viral illness  Uvular edema    Rx / DC Orders ED Discharge Orders     None         Delora Fuel, MD 35/46/56 [phone]

## 2022-12-14 NOTE — Discharge Instructions (Signed)
Drink plenty of fluids.  Take acetaminophen and/or ibuprofen as needed for fever or pain.  Take diphenhydramine as needed.  Return if any of your symptoms are worsening.

## 2022-12-14 NOTE — ED Provider Notes (Signed)
Patient signed to me by Dr. Roxanne Mins pending results of second troponin which came back negative.  Will discharge home   [name], MD 12/14/22 [phone]

## 2022-12-14 NOTE — ED Triage Notes (Signed)
Patient arrives POV c/o chest pain and SHOB that started 15 minutes ago. Pt has hx of HTN. Pt states pain radiates down right arm.

## 2022-12-16 ENCOUNTER — Other Ambulatory Visit (HOSPITAL_COMMUNITY): Payer: Self-pay

## 2022-12-16 ENCOUNTER — Telehealth: Payer: Self-pay

## 2022-12-16 ENCOUNTER — Other Ambulatory Visit: Payer: Self-pay | Admitting: Family

## 2022-12-16 DIAGNOSIS — R222 Localized swelling, mass and lump, trunk: Secondary | ICD-10-CM

## 2022-12-16 DIAGNOSIS — K219 Gastro-esophageal reflux disease without esophagitis: Secondary | ICD-10-CM

## 2022-12-16 MED ORDER — PANTOPRAZOLE SODIUM 40 MG PO TBEC
40.0000 mg | DELAYED_RELEASE_TABLET | Freq: Two times a day (BID) | ORAL | 0 refills | Status: DC
  Filled 2022-12-16: qty 60, 30d supply, fill #0
  Filled 2023-04-16: qty 60, 30d supply, fill #1
  Filled 2023-05-29: qty 60, 30d supply, fill #2

## 2022-12-16 NOTE — Telephone Encounter (Signed)
Copied from [facility] [id number]. Topic: Referral - Status >> Dec 16, 2022  9:07 AM Cyndi Bender wrote: Reason for CRM: Pt spouse stated pt has not been contacted for regarding the referral for the knot on his chest.  Contacted [facility] appointment scheduled for 12/25/22

## 2022-12-16 NOTE — Telephone Encounter (Signed)
Requested medications are due for refill today.  yes  Requested medications are on the active medications list.  yes  Last refill. 04/22/2022 #180  Future visit scheduled.   yes  Notes to clinic.  Rx written to expire 07/21/2022 - Rx is expired.    Requested Prescriptions  Pending Prescriptions Disp Refills   pantoprazole (PROTONIX) 40 MG tablet 180 tablet 0    Sig: Take 1 tablet (40 mg total) by mouth 2 (two) times daily before a meal.     Gastroenterology: Proton Pump Inhibitors Passed - 12/16/2022 10:01 AM      Passed - Valid encounter within last 12 months    Recent Outpatient Visits           3 weeks ago Primary hypertension   [facility], Amy J, NP   3 months ago OSA on CPAP   [facility], [place], NP   1 year ago History of anaphylaxis   [facility], [place], NP   1 year ago Hospital discharge follow-up   [facility], [place], NP   1 year ago Essential hypertension   [place] [facility], Bayard Beaver, MD       Future Appointments             In 6 days Camillia Herter, NP [place] at [facility]   In 2 months Camillia Herter, NP [place] at [facility]

## 2022-12-16 NOTE — Telephone Encounter (Signed)
Medication Refill - Medication: pantoprazole (PROTONIX) 40 MG tablet   Has the patient contacted their pharmacy? Yes.    Preferred Pharmacy (with phone number or street name):  [facility] Phone: [phone]  Fax: [phone]     Has the patient been seen for an appointment in the last year OR does the patient have an upcoming appointment? Yes.    Agent: Please be advised that RX refills may take up to 3 business days. We ask that you follow-up with your pharmacy.

## 2022-12-18 ENCOUNTER — Other Ambulatory Visit (HOSPITAL_COMMUNITY): Payer: Self-pay

## 2022-12-18 MED ORDER — NALOXONE HCL 4 MG/0.1ML NA LIQD: NASAL | 0 refills | Status: DC

## 2022-12-18 MED ORDER — HYDROCODONE-ACETAMINOPHEN 10-325 MG PO TABS
1.0000 | ORAL_TABLET | Freq: Three times a day (TID) | ORAL | 0 refills | Status: DC | PRN
  Filled 2022-12-18: qty 21, 7d supply, fill #0

## 2022-12-19 ENCOUNTER — Other Ambulatory Visit (HOSPITAL_COMMUNITY): Payer: Self-pay

## 2022-12-19 NOTE — Telephone Encounter (Signed)
error 

## 2022-12-19 NOTE — Progress Notes (Unsigned)
Patient ID: Darrell Conway, male    DOB: [birth year]  MRN: [id number]  CC: Emergency Department Follow-Up  Subjective: Darrell Conway is a 57 y.o. male who presents for emergency department follow-up.   His concerns today include:  12/14/2022 [address] [facility] Emergency Department at [facility] per MD note: Medical Decision Making Amount and/or Complexity of Data Reviewed Labs: ordered. Radiology: ordered.   Risk OTC drugs. Prescription drug management.    Chest pain and arm pain in the setting of fever.  This most likely represents a viral illness such as influenza, COVID-19, RSV.  Consider pneumonia.  I have low index of suspicion for ACS, but feel that it is still appropriate to check electrocardiogram and serial troponins.  Doubt pulmonary embolism, dissecting aortic aneurysm.  Uvula edema likely related to underlying viral syndrome, consider allergic reaction.  No evidence of angioedema, no evidence of airway obstruction.  I have ordered laboratory workup of CBC, basic metabolic panel, troponin.  I have ordered an electrocardiogram and chest x-ray.  I have ordered a dose of aspirin and a dose of diphenhydramine.  I have ordered respiratory pathogen panel.   Chest x-ray shows no evidence of pneumonia, no acute process.  Have independently viewed the image, and agree with the radiologist's interpretation.  I have reviewed and interpreted his electrocardiogram, and my interpretation is borderline T wave changes which are unchanged from prior.  I have reviewed and interpreted his laboratory test, and my interpretation is mild elevation of random glucose, normal troponin x 1, mild leukocytosis which is nonspecific.  Repeat troponin is pending.  Respiratory pathogen panel is negative for influenza, COVID-19, RSV.  I have ordered a dose of ketorolac for pain.   Patient was reevaluated, he is feeling significantly improved.  I feel it is appropriate to put him on a course of steroids for his  uvula edema and I have ordered a dose of methylprednisolone.  Case is signed out to Dr. Zenia Resides pending repeat troponin.  I anticipate discharging him on a short course of oral prednisone.  Patient signed to me by Dr. Roxanne Mins pending results of second troponin which came back negative.  Will discharge home   Darrell Leigh, MD 12/14/22 [phone]   Today's visit 12/22/2022: Since emergency department visit feeling improved. He denies any red flag symptoms today in office. Reports he does feel some "irritation" of the throat upon awakening but otherwise feels normal. States he thinks he had a reaction to Phentermine so no longer taking. He requests a refill on Epipen should he need it. Reports he has not heard from mammogram to schedule appointment since last visit. I discussed with patient I will consult with referral coordinator to see if we can get patient scheduled soon. No further issues/concerns for today.    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   History of total knee arthroplasty 07/30/2021   Essential hypertension 01/03/2021   Osteoarthritis of right knee 11/09/2020   Pain in joint of right knee 11/09/2020   Low back pain 10/15/2020   Irregular heart beat 02/14/2020   Personal history of COVID-19 02/13/2020   Amnesia 02/13/2020   Poor concentration 02/13/2020   Muscle pain 02/13/2020   Unilateral primary osteoarthritis, left hip 01/31/2020   Osteoarthritis of left hip 01/31/2020   Morbid  obesity ([place]) 06/29/2019   Primary osteoarthritis of both knees 05/05/2019   Primary osteoarthritis of both hips    Multiple joint pain 04/26/2019   Pain in left foot 04/26/2019   Obstructive sleep apnea of adult 04/11/2019   Laryngopharyngeal reflux 04/11/2019     Current Outpatient Medications on File  Prior to Visit  Medication Sig Dispense Refill   amLODipine (NORVASC) 10 MG tablet Take 1 tablet (10 mg total) by mouth daily. 30 tablet 2   atorvastatin (LIPITOR) 20 MG tablet Take 1 tablet (20 mg total) by mouth daily. 30 tablet 2   ciclopirox (PENLAC) 8 % solution Apply topically at bedtime. Apply over nail and surrounding skin. Apply daily over previous coat. After seven (7) days, may remove with alcohol and continue cycle. (Patient not taking: Reported on 12/22/2022) 6.6 mL 0   Multiple Vitamin (MULTIVITAMIN) capsule Take 1 capsule by mouth daily.     pantoprazole (PROTONIX) 40 MG tablet Take 1 tablet (40 mg total) by mouth 2 (two) times daily before a meal. 180 tablet 0   acetaminophen (TYLENOL) 325 MG tablet Take 1-2 tablets (325-650 mg total) by mouth every 6 (six) hours as needed for mild pain (pain score 1-3 or temp > 100.5). (Patient not taking: Reported on 12/14/2022)     HYDROcodone-acetaminophen (NORCO) 10-325 MG tablet Take 1 tablet by mouth 3 (three) times daily as needed. 21 tablet 0   naloxone (NARCAN) nasal spray 4 mg/0.1 mL spray 1 dose into 1 nostril, alternate nostrils with each dose until help arrives 2 each 0   phentermine 15 MG capsule Take 1 capsule (15 mg total) by mouth every morning. (Patient not taking: Reported on 12/14/2022) 30 capsule 0   [DISCONTINUED] fluticasone (FLONASE) 50 MCG/ACT nasal spray Place 2 sprays into both nostrils daily. 16 g 6   [DISCONTINUED] orlistat (ALLI) 60 MG capsule Take 1 capsule (60 mg total) by mouth 3 (three) times daily with meals. 90 capsule 1   No current facility-administered medications on file prior to visit.    Allergies  Allergen Reactions   Lisinopril Swelling and Rash    Still taking the medication at home.  Pt reports getting welts on skin around sides/stomach and elsewhere.  He also reports mild lip swell.    Social History   Socioeconomic History   Marital status: Married    Spouse name: Not on file   Number of  children: Not on file   Years of education: Not on file   Highest education level: Not on file  Occupational History   Occupation: Clinical biochemist  Tobacco Use   Smoking status: Former    Packs/day: 0.75    Types: Cigarettes    Quit date: 2015    Years since quitting: 9.0    Passive exposure: Past   Smokeless tobacco: Never  Vaping Use   Vaping Use: Never used  Substance and Sexual Activity   Alcohol use: Not Currently   Drug use: No   Sexual activity: Not Currently    Birth control/protection: None  Other Topics Concern   Not on file  Social History Narrative   Not on file   Social Determinants of Health   Financial Resource Strain: Not on file  Food Insecurity: Not on file  Transportation Needs: Not on file  Physical Activity: Not on file  Stress: Not on file  Social Connections: Not on file  Intimate Partner Violence: Not on file    Family History  Problem Relation Age of  Onset   Rheum arthritis Mother    Hypertension Mother    Rheum arthritis Sister     Past Surgical History:  Procedure Laterality Date   HERNIA REPAIR     TOTAL KNEE ARTHROPLASTY Right 07/30/2021   Procedure: TOTAL KNEE ARTHROPLASTY;  Surgeon: Paralee Cancel, MD;  Location: [facility];  Service: Orthopedics;  Laterality: Right;    ROS: Review of Systems Negative except as stated above  PHYSICAL EXAM: BP 130/87 (BP Location: Left Arm, Patient Position: Sitting, Cuff Size: Large)   Pulse 98   Temp 98.1 F (36.7 C) (Oral)   Resp 16   Ht 6' (1.829 m)   Wt (!) 302 lb (137 kg)   SpO2 95%   BMI 40.96 kg/m   Physical Exam HENT:     Head: Normocephalic and atraumatic.     Nose: Nose normal.     Mouth/Throat:     Mouth: Mucous membranes are moist.     Pharynx: Oropharynx is clear.  Eyes:     Extraocular Movements: Extraocular movements intact.     Conjunctiva/sclera: Conjunctivae normal.     Pupils: Pupils are equal, round, and reactive to light.  Cardiovascular:     Rate and Rhythm:  Normal rate and regular rhythm.     Pulses: Normal pulses.     Heart sounds: Normal heart sounds.  Pulmonary:     Effort: Pulmonary effort is normal.     Breath sounds: Normal breath sounds.  Musculoskeletal:     Cervical back: Normal range of motion and neck supple.  Neurological:     General: No focal deficit present.     Mental Status: He is alert and oriented to person, place, and time.  Psychiatric:        Mood and Affect: Mood normal.        Behavior: Behavior normal.     ASSESSMENT AND PLAN: 1. Nonspecific chest pain - Resolved.   2. Viral illness 3. Uvular edema 4. History of anaphylaxis - Patient today in office with no cardiopulmomary or ENT distress. - Prednisone as prescribed. Counseled on medication adherence.  - Epinephrine injection as prescribed.  - Follow-up with primary provider as scheduled.  - EPINEPHrine 0.3 mg/0.3 mL IJ SOAJ injection; Inject 0.3 mg into the muscle as needed for anaphylaxis.  Dispense: 2 each; Refill: 2 - predniSONE (DELTASONE) 50 MG tablet; Take 1 tablet (50 mg total) by mouth daily.  Dispense: 5 tablet; Refill: 0   Patient was given the opportunity to ask questions.  Patient verbalized understanding of the plan and was able to repeat key elements of the plan. Patient was given clear instructions to go to Emergency Department or return to medical center if symptoms don't improve, worsen, or new problems develop.The patient verbalized understanding.   Requested Prescriptions   Signed Prescriptions Disp Refills   EPINEPHrine 0.3 mg/0.3 mL IJ SOAJ injection 2 each 2    Sig: Inject 0.3 mg into the muscle as needed for anaphylaxis.   predniSONE (DELTASONE) 50 MG tablet 5 tablet 0    Sig: Take 1 tablet (50 mg total) by mouth daily.    Follow-up with primary provider as scheduled.   Camillia Herter, NP

## 2022-12-22 ENCOUNTER — Other Ambulatory Visit (HOSPITAL_COMMUNITY): Payer: Self-pay

## 2022-12-22 ENCOUNTER — Telehealth: Payer: Self-pay | Admitting: Family

## 2022-12-22 ENCOUNTER — Ambulatory Visit (INDEPENDENT_AMBULATORY_CARE_PROVIDER_SITE_OTHER): Payer: No Typology Code available for payment source | Admitting: Family

## 2022-12-22 ENCOUNTER — Encounter: Payer: Self-pay | Admitting: Family

## 2022-12-22 VITALS — BP 130/87 | HR 98 | Temp 98.1°F | Resp 16 | Ht 72.0 in | Wt 302.0 lb

## 2022-12-22 DIAGNOSIS — B349 Viral infection, unspecified: Secondary | ICD-10-CM

## 2022-12-22 DIAGNOSIS — K1379 Other lesions of oral mucosa: Secondary | ICD-10-CM

## 2022-12-22 DIAGNOSIS — E669 Obesity, unspecified: Secondary | ICD-10-CM | POA: Diagnosis not present

## 2022-12-22 DIAGNOSIS — R079 Chest pain, unspecified: Secondary | ICD-10-CM | POA: Diagnosis not present

## 2022-12-22 DIAGNOSIS — Z87892 Personal history of anaphylaxis: Secondary | ICD-10-CM

## 2022-12-22 DIAGNOSIS — Z6841 Body Mass Index (BMI) 40.0 and over, adult: Secondary | ICD-10-CM

## 2022-12-22 MED ORDER — EPINEPHRINE 0.3 MG/0.3ML IJ SOAJ
0.3000 mg | INTRAMUSCULAR | 2 refills | Status: AC | PRN
  Filled 2022-12-22: qty 2, 30d supply, fill #0

## 2022-12-22 MED ORDER — PREDNISONE 50 MG PO TABS
50.0000 mg | ORAL_TABLET | Freq: Every day | ORAL | 0 refills | Status: DC
  Filled 2022-12-22: qty 5, 5d supply, fill #0

## 2022-12-22 NOTE — Progress Notes (Signed)
Needs Rx for Epi pen Woke up to throat swelling and diaphoretic

## 2022-12-24 ENCOUNTER — Encounter: Payer: Self-pay | Admitting: *Deleted

## 2022-12-24 NOTE — Telephone Encounter (Signed)
Attempt to call patient to let him know when mammogram is scheduled.  Will forward message regarding apt in MyChart.

## 2022-12-25 ENCOUNTER — Other Ambulatory Visit (HOSPITAL_COMMUNITY): Payer: Self-pay

## 2022-12-25 ENCOUNTER — Other Ambulatory Visit: Payer: No Typology Code available for payment source

## 2022-12-25 MED ORDER — MELOXICAM 15 MG PO TABS
15.0000 mg | ORAL_TABLET | Freq: Every day | ORAL | 0 refills | Status: DC
  Filled 2022-12-25: qty 15, 15d supply, fill #0

## 2022-12-25 MED ORDER — HYDROCODONE-ACETAMINOPHEN 10-325 MG PO TABS
1.0000 | ORAL_TABLET | Freq: Three times a day (TID) | ORAL | 0 refills | Status: AC | PRN
  Filled 2022-12-25: qty 90, 30d supply, fill #0

## 2022-12-26 ENCOUNTER — Other Ambulatory Visit (HOSPITAL_COMMUNITY): Payer: Self-pay

## 2022-12-26 MED ORDER — OXYCODONE HCL 10 MG PO TABS
10.0000 mg | ORAL_TABLET | Freq: Three times a day (TID) | ORAL | 0 refills | Status: DC
  Filled 2022-12-26: qty 90, 30d supply, fill #0

## 2022-12-29 ENCOUNTER — Institutional Professional Consult (permissible substitution): Payer: No Typology Code available for payment source | Admitting: Primary Care

## 2022-12-29 ENCOUNTER — Other Ambulatory Visit: Payer: Self-pay | Admitting: Family

## 2022-12-29 ENCOUNTER — Other Ambulatory Visit (HOSPITAL_COMMUNITY): Payer: Self-pay

## 2022-12-29 ENCOUNTER — Ambulatory Visit
Admission: RE | Admit: 2022-12-29 | Discharge: 2022-12-29 | Disposition: A | Discharge status: Home / Self Care | Payer: No Typology Code available for payment source | Source: Ambulatory Visit | Attending: Family | Admitting: Family

## 2022-12-29 DIAGNOSIS — D171 Benign lipomatous neoplasm of skin and subcutaneous tissue of trunk: Secondary | ICD-10-CM

## 2022-12-29 DIAGNOSIS — R222 Localized swelling, mass and lump, trunk: Secondary | ICD-10-CM

## 2022-12-31 ENCOUNTER — Other Ambulatory Visit (HOSPITAL_COMMUNITY): Payer: Self-pay

## 2023-01-08 ENCOUNTER — Institutional Professional Consult (permissible substitution): Payer: No Typology Code available for payment source | Admitting: Primary Care

## 2023-01-15 ENCOUNTER — Ambulatory Visit: Payer: No Typology Code available for payment source | Admitting: Primary Care

## 2023-01-15 ENCOUNTER — Encounter: Payer: Self-pay | Admitting: Primary Care

## 2023-01-15 VITALS — BP 138/82 | HR 82 | Temp 97.5°F | Ht 73.0 in | Wt 304.0 lb

## 2023-01-15 DIAGNOSIS — G4733 Obstructive sleep apnea (adult) (pediatric): Secondary | ICD-10-CM

## 2023-01-15 NOTE — Patient Instructions (Addendum)
Sleep apnea is defined as period of 10 seconds or longer when you stop breathing at night. This can happen multiple times a night. Dx sleep apnea is when this occurs more than 5 times an hour.    Mild OSA 5-15 apneic events an hour Moderate OSA 15-30 apneic events an hour Severe OSA > 30 apneic events an hour   Untreated sleep apnea puts you at higher risk for cardiac arrhythmias, pulmonary HTN, stroke and diabetes  Treatment options include weight loss, side sleeping position, oral appliance, CPAP therapy or referral to ENT for possible surgical options    Recommendations: Focus on side sleeping position or elevate head with wedge pillow 30 degrees Work on weight loss efforts as able  Do not drive if experiencing excessive daytime sleepiness of fatigue    Orders: Home sleep study re: loud snoring    Follow-up: Please call to schedule follow-up 1-2 weeks after completing for results and treatment if needed (can be virtual)   Sleep Apnea Sleep apnea is a condition in which breathing pauses or becomes shallow during sleep. People with sleep apnea usually snore loudly. They may have times when they gasp and stop breathing for 10 seconds or more during sleep. This may happen many times during the night. Sleep apnea disrupts your sleep and keeps your body from getting the rest that it needs. This condition can increase your risk of certain health problems, including: Heart attack. Stroke. Obesity. Type 2 diabetes. Heart failure. Irregular heartbeat. High blood pressure. The goal of treatment is to help you breathe normally again. What are the causes?  The most common cause of sleep apnea is a collapsed or blocked airway. There are three kinds of sleep apnea: Obstructive sleep apnea. This kind is caused by a blocked or collapsed airway. Central sleep apnea. This kind happens when the part of the brain that controls breathing does not send the correct signals to the muscles that  control breathing. Mixed sleep apnea. This is a combination of obstructive and central sleep apnea. What increases the risk? You are more likely to develop this condition if you: Are overweight. Smoke. Have a smaller than normal airway. Are older. Are male. Drink alcohol. Take sedatives or tranquilizers. Have a family history of sleep apnea. Have a tongue or tonsils that are larger than normal. What are the signs or symptoms? Symptoms of this condition include: Trouble staying asleep. Loud snoring. Morning headaches. Waking up gasping. Dry mouth or sore throat in the morning. Daytime sleepiness and tiredness. If you have daytime fatigue because of sleep apnea, you may be more likely to have: Trouble concentrating. Forgetfulness. Irritability or mood swings. Personality changes. Feelings of depression. Sexual dysfunction. This may include loss of interest if you are male, or erectile dysfunction if you are male. How is this diagnosed? This condition may be diagnosed with: A medical history. A physical exam. A series of tests that are done while you are sleeping (sleep study). These tests are usually done in a sleep lab, but they may also be done at home. How is this treated? Treatment for this condition aims to restore normal breathing and to ease symptoms during sleep. It may involve managing health issues that can affect breathing, such as high blood pressure or obesity. Treatment may include: Sleeping on your side. Using a decongestant if you have nasal congestion. Avoiding the use of depressants, including alcohol, sedatives, and narcotics. Losing weight if you are overweight. Making changes to your diet. Quitting smoking. Using  a device to open your airway while you sleep, such as: An oral appliance. This is a custom-made mouthpiece that shifts your lower jaw forward. A continuous positive airway pressure (CPAP) device. This device blows air through a mask when you  breathe out (exhale). A nasal expiratory positive airway pressure (EPAP) device. This device has valves that you put into each nostril. A bi-level positive airway pressure (BIPAP) device. This device blows air through a mask when you breathe in (inhale) and breathe out (exhale). Having surgery if other treatments do not work. During surgery, excess tissue is removed to create a wider airway. Follow these instructions at home: Lifestyle Make any lifestyle changes that your health care provider recommends. Eat a healthy, well-balanced diet. Take steps to lose weight if you are overweight. Avoid using depressants, including alcohol, sedatives, and narcotics. Do not use any products that contain nicotine or tobacco. These products include cigarettes, chewing tobacco, and vaping devices, such as e-cigarettes. If you need help quitting, ask your health care provider. General instructions Take over-the-counter and prescription medicines only as told by your health care provider. If you were given a device to open your airway while you sleep, use it only as told by your health care provider. If you are having surgery, make sure to tell your health care provider you have sleep apnea. You may need to bring your device with you. Keep all follow-up visits. This is important. Contact a health care provider if: The device that you received to open your airway during sleep is uncomfortable or does not seem to be working. Your symptoms do not improve. Your symptoms get worse. Get help right away if: You develop: Chest pain. Shortness of breath. Discomfort in your back, arms, or stomach. You have: Trouble speaking. Weakness on one side of your body. Drooping in your face. These symptoms may represent a serious problem that is an emergency. Do not wait to see if the symptoms will go away. Get medical help right away. Call your local emergency services (911 in the [place] Do not drive yourself to the  hospital. Summary Sleep apnea is a condition in which breathing pauses or becomes shallow during sleep. The most common cause is a collapsed or blocked airway. The goal of treatment is to restore normal breathing and to ease symptoms during sleep. This information is not intended to replace advice given to you by your health care provider. Make sure you discuss any questions you have with your health care provider. Document Revised: 06/19/2021 Document Reviewed: 10/19/2020 Elsevier Patient Education  [employer].

## 2023-01-15 NOTE — Progress Notes (Signed)
Reviewed and agree with assessment/plan.   Chesley Mires, MD [facility] Pulmonary/Critical Care 01/15/2023, 11:58 AM Pager:  [phone]

## 2023-01-15 NOTE — Progress Notes (Signed)
$@Patientn$  ID: Darrell Conway, male    DOB: 03-11-1966, 57 y.o.   MRN: [id number]  Chief Complaint  Patient presents with   Consult    OSA  Snoring  Epworth 10     Referring provider: Camillia Herter, NP  HPI: 57 year old male, former smoker quit in 2015.  Past medical history significant for hypertension, LPR, OSA, obesity.  01/15/2023 Patient presents today for sleep consult. Accompanied by his wife. He has a history of sleep apnea. He was previously on CPAP but stopped wearing 4-5 years ago after his machine stopped working. He has persistent snoring symptoms. Waking up gasping/choking at night. He has some questions about alternative treatment options for sleep apnea. We discussed hypoglossal nerve stimulator, his BMI excludes him at this time from getting Inspire device. He is wanting to work on weight loss. He is open to re-startng cpap if needed.   Sleep questionnaire Symptoms-  Snoring, witnessed apnea, waking up choking  Prior sleep study- HST 06/15/2019 moderate OSA>> AHI 17/hr  Bedtime-10-11pm  Time to fall asleep- 1 hour Nocturnal awakenings- 2-3 times Out of bed/start of day- 7am Weight changes- up  Do you operate heavy machinery- no Do you currently wear CPAP- no Do you current wear oxygen- no Epworth- 10   Allergies  Allergen Reactions   Lisinopril Swelling and Rash    Still taking the medication at home.  Pt reports getting welts on skin around sides/stomach and elsewhere.  He also reports mild lip swell.    Immunization History  Administered Date(s) Administered   Influenza Whole 08/25/2022   Influenza,inj,Quad PF,6+ Mos 10/20/2018, 08/31/2019, 10/02/2020   Influenza,inj,quad, With Preservative 10/26/2020   PFIZER(Purple Top)SARS-COV-2 Vaccination 03/07/2020, 03/28/2020, 10/16/2020    Past Medical History:  Diagnosis Date   Anemia    pt denies   Back pain    GERD (gastroesophageal reflux disease)    Gout    Heartburn    High cholesterol    Hip  pain    Hypertension    Knee pain    Pneumonia    Rheumatoid arthritis (HCC)    Sleep apnea    no Cpap does not wear   Swallowing difficulty     Tobacco History: Social History   Tobacco Use  Smoking Status Former   Packs/day: 0.75   Types: Cigarettes   Quit date: 2015   Years since quitting: 9.1   Passive exposure: Past  Smokeless Tobacco Never   Counseling given: Not Answered   Outpatient Medications Prior to Visit  Medication Sig Dispense Refill   acetaminophen (TYLENOL) 325 MG tablet Take 1-2 tablets (325-650 mg total) by mouth every 6 (six) hours as needed for mild pain (pain score 1-3 or temp > 100.5).     amLODipine (NORVASC) 10 MG tablet Take 1 tablet (10 mg total) by mouth daily. 30 tablet 2   atorvastatin (LIPITOR) 20 MG tablet Take 1 tablet (20 mg total) by mouth daily. 30 tablet 2   ciclopirox (PENLAC) 8 % solution Apply topically at bedtime. Apply over nail and surrounding skin. Apply daily over previous coat. After seven (7) days, may remove with alcohol and continue cycle. 6.6 mL 0   EPINEPHrine 0.3 mg/0.3 mL IJ SOAJ injection Inject 0.3 mg into the muscle as needed for anaphylaxis. 2 each 2   meloxicam (MOBIC) 15 MG tablet Take 1 tablet (15 mg total) by mouth daily. 15 tablet 0   Multiple Vitamin (MULTIVITAMIN) capsule Take 1 capsule by mouth daily.  naloxone (NARCAN) nasal spray 4 mg/0.1 mL spray 1 dose into 1 nostril, alternate nostrils with each dose until help arrives 2 each 0   Oxycodone HCl 10 MG TABS Take 1 tablet (10 mg total) by mouth 3 (three) times daily. 90 tablet 0   pantoprazole (PROTONIX) 40 MG tablet Take 1 tablet (40 mg total) by mouth 2 (two) times daily before a meal. 180 tablet 0   phentermine 15 MG capsule Take 1 capsule (15 mg total) by mouth every morning. 30 capsule 0   HYDROcodone-acetaminophen (NORCO) 10-325 MG tablet Take 1 tablet by mouth 3 (three) times daily as needed. (Patient not taking: Reported on 01/15/2023) 90 tablet 0    predniSONE (DELTASONE) 50 MG tablet Take 1 tablet (50 mg total) by mouth daily. (Patient not taking: Reported on 01/15/2023) 5 tablet 0   No facility-administered medications prior to visit.      Review of Systems  Review of Systems  Constitutional:  Positive for fatigue.  HENT: Negative.    Respiratory:  Positive for apnea.   Cardiovascular: Negative.   Psychiatric/Behavioral:  Positive for sleep disturbance.      Physical Exam  BP 138/82 (BP Location: Left Arm, Patient Position: Sitting, Cuff Size: Large)   Pulse 82   Temp (!) 97.5 F (36.4 C) (Temporal)   Ht 6' 1"$  (1.854 m)   Wt (!) 304 lb (137.9 kg)   SpO2 96%   BMI 40.11 kg/m  Physical Exam Constitutional:      Appearance: Normal appearance.  HENT:     Head: Normocephalic and atraumatic.     Mouth/Throat:     Mouth: Mucous membranes are moist.     Pharynx: Oropharynx is clear.  Cardiovascular:     Rate and Rhythm: Normal rate and regular rhythm.  Pulmonary:     Effort: Pulmonary effort is normal.     Breath sounds: Normal breath sounds.  Musculoskeletal:        General: Normal range of motion.  Skin:    General: Skin is warm and dry.  Neurological:     General: No focal deficit present.     Mental Status: He is alert and oriented to person, place, and time. Mental status is at baseline.  Psychiatric:        Mood and Affect: Mood normal.        Behavior: Behavior normal.        Thought Content: Thought content normal.        Judgment: Judgment normal.      Lab Results:  CBC    Component Value Date/Time   WBC 12.7 (H) 12/14/2022 0442   RBC 5.32 12/14/2022 0442   HGB 15.3 12/14/2022 0442   HGB 15.4 12/02/2022 1130   HCT 45.1 12/14/2022 0442   HCT 45.5 12/02/2022 1130   PLT 211 12/14/2022 0442   PLT 271 12/02/2022 1130   MCV 84.8 12/14/2022 0442   MCV 85 12/02/2022 1130   MCH 28.8 12/14/2022 0442   MCHC 33.9 12/14/2022 0442   RDW 13.7 12/14/2022 0442   RDW 13.2 12/02/2022 1130   LYMPHSABS  1.3 12/14/2022 0442   LYMPHSABS 1.9 12/02/2022 1130   MONOABS 1.2 (H) 12/14/2022 0442   EOSABS 0.2 12/14/2022 0442   EOSABS 0.2 12/02/2022 1130   BASOSABS 0.1 12/14/2022 0442   BASOSABS 0.1 12/02/2022 1130    BMET    Component Value Date/Time   NA 135 12/14/2022 0442   NA 138 08/20/2022 1126   K 4.3  12/14/2022 0442   CL 104 12/14/2022 0442   CO2 23 12/14/2022 0442   GLUCOSE 103 (H) 12/14/2022 0442   BUN 9 12/14/2022 0442   BUN 9 08/20/2022 1126   CREATININE 0.69 12/14/2022 0442   CALCIUM 9.2 12/14/2022 0442   GFRNONAA >60 12/14/2022 0442   GFRAA 115 12/31/2020 0847    BNP No results found for: "BNP"  ProBNP No results found for: "PROBNP"  Imaging: MM DIAG BREAST TOMO BILATERAL  Addendum Date: 01/05/2023   ADDENDUM REPORT: 01/05/2023 08:16 ADDENDUM: Surgical consultation has been arranged with Dr. Stark Klein at [facility] on January 05, 2023. Stacie Acres RN on 01/01/2023 Electronically Signed   By: Kristopher Oppenheim M.D.   On: 01/05/2023 08:16   Result Date: 01/05/2023 CLINICAL DATA:  57 year old male with an enlarging palpable lump along the mid sternum for several months. EXAM: DIGITAL DIAGNOSTIC BILATERAL MAMMOGRAM WITH TOMOSYNTHESIS; ULTRASOUND LEFT BREAST LIMITED TECHNIQUE: Bilateral digital diagnostic mammography and breast tomosynthesis was performed.; Targeted ultrasound examination of the left breast was performed. COMPARISON:  None available. ACR Breast Density Category a: The breast tissue is almost entirely fatty. FINDINGS: There are no focal or suspicious mammographic findings in either breast. On physical exam, I palpate a soft, mobile 2-3 cm mass along the sternum. Targeted ultrasound is performed, showing an oval, circumscribed fat density mass along the left, mid sternum. It measures 2.7 x 0.9 x 2.6 cm. There is no internal vascularity. This correlates with the palpable lump. IMPRESSION: 1. 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 Given a subjective increase in size, recommend surgical evaluation for excision. 2. No mammographic evidence of malignancy in either breast. RECOMMENDATION: Surgical consultation for possible excision of the patient's enlarging mid sternum lipoma. I have discussed the findings and recommendations with the patient. If applicable, a reminder letter will be sent to the patient regarding the next appointment. BI-RADS CATEGORY  4: Suspicious. Electronically Signed: By: Kristopher Oppenheim M.D. On: 12/29/2022 09:33  US BREAST LTD UNI LEFT INC AXILLA  Addendum Date: 01/05/2023   ADDENDUM REPORT: 01/05/2023 08:16 ADDENDUM: Surgical consultation has been arranged with Dr. Stark Klein at [facility] on January 05, 2023. Stacie Acres RN on 01/01/2023 Electronically Signed   By: Kristopher Oppenheim M.D.   On: 01/05/2023 08:16   Result Date: 01/05/2023 CLINICAL DATA:  57 year old male with an enlarging palpable lump along the mid sternum for several months. EXAM: DIGITAL DIAGNOSTIC BILATERAL MAMMOGRAM WITH TOMOSYNTHESIS; ULTRASOUND LEFT BREAST LIMITED TECHNIQUE: Bilateral digital diagnostic mammography and breast tomosynthesis was performed.; Targeted ultrasound examination of the left breast was performed. COMPARISON:  None available. ACR Breast Density Category a: The breast tissue is almost entirely fatty. FINDINGS: There are no focal or suspicious mammographic findings in either breast. On physical exam, I palpate a soft, mobile 2-3 cm mass along the sternum. Targeted ultrasound is performed, showing an oval, circumscribed fat density mass along the left, mid sternum. It measures 2.7 x 0.9 x 2.6 cm. There is no internal vascularity. This correlates with the palpable lump. IMPRESSION: 1. 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 Given a subjective increase in size, recommend surgical evaluation for excision. 2.  No mammographic evidence of malignancy in either breast. RECOMMENDATION: Surgical consultation for possible excision of the patient's enlarging mid sternum lipoma. I have discussed the findings and recommendations with the patient. If applicable,  a reminder letter will be sent to the patient regarding the next appointment. BI-RADS CATEGORY  4: Suspicious. Electronically Signed: By: Kristopher Oppenheim M.D. On: 12/29/2022 09:33    Assessment & Plan:   Obstructive sleep apnea of adult - Hx moderate OSA. HST 06/15/2019 >> AHI 17/hr. Patient was on CPAP for a short period, stopped wearing 4 years ago d/t issues with machine not working properly. He continues to have loud snoring, witnessed apnea, waking up choking and daytime sleepiness. Epworth score 10. BMI 40. He is interested in alternative to CPAP for treatment of sleep apnea. We discussed weight loss, oral appliance or hypoglossal nerve stimulator. He is not a current candidate for Inspire device d/t BMI. He is open to resuming CPAP if needed.  Reviewed risks of untreated sleep apnea including cardiac arrhythmias, pulm hypertension, diabetes or stroke.  He needs repeat home sleep study to re-assess degree of OSA. Encourage weight loss efforts.  Advised patient to follow-up 1 to 2 weeks after completing sleep study to review results and treatment options if needed.     Martyn Ehrich, NP 01/15/2023

## 2023-01-15 NOTE — Assessment & Plan Note (Signed)
-   Hx moderate OSA. HST 06/15/2019 >> AHI 17/hr. Patient was on CPAP for a short period, stopped wearing 4 years ago d/t issues with machine not working properly. He continues to have loud snoring, witnessed apnea, waking up choking and daytime sleepiness. Epworth score 10. BMI 40. He is interested in alternative to CPAP for treatment of sleep apnea. We discussed weight loss, oral appliance or hypoglossal nerve stimulator. He is not a current candidate for Inspire device d/t BMI. He is open to resuming CPAP if needed.  Reviewed risks of untreated sleep apnea including cardiac arrhythmias, pulm hypertension, diabetes or stroke.  He needs repeat home sleep study to re-assess degree of OSA. Encourage weight loss efforts.  Advised patient to follow-up 1 to 2 weeks after completing sleep study to review results and treatment options if needed.

## 2023-01-23 ENCOUNTER — Other Ambulatory Visit (HOSPITAL_COMMUNITY): Payer: Self-pay

## 2023-01-23 MED ORDER — MELOXICAM 15 MG PO TABS
15.0000 mg | ORAL_TABLET | Freq: Every day | ORAL | 0 refills | Status: DC
  Filled 2023-01-23: qty 15, 15d supply, fill #0

## 2023-01-23 MED ORDER — HYDROCODONE-ACETAMINOPHEN 10-325 MG PO TABS
1.0000 | ORAL_TABLET | Freq: Three times a day (TID) | ORAL | 0 refills | Status: DC | PRN
  Filled 2023-01-23: qty 90, 30d supply, fill #0

## 2023-02-16 NOTE — Progress Notes (Deleted)
Patient ID: Dadrian Dietz, male    DOB: 17-Apr-1966  MRN: [id number]  CC: Chronic Care Management   Subjective: Cotey Ham is a 57 y.o. male who presents for chronic care management.   His concerns today include:  HTN - Amlodipine  HLD - Atorvastatin  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   History of total knee arthroplasty 07/30/2021   Essential hypertension 01/03/2021   Osteoarthritis of right knee 11/09/2020   Pain in joint of right knee 11/09/2020   Low back pain 10/15/2020   Irregular heart beat 02/14/2020   Personal history of COVID-19 02/13/2020   Amnesia 02/13/2020   Poor concentration 02/13/2020   Muscle pain 02/13/2020   Unilateral primary osteoarthritis, left hip 01/31/2020   Osteoarthritis of left hip 01/31/2020   Morbid obesity ([place]) 06/29/2019   Primary osteoarthritis of both knees 05/05/2019   Primary osteoarthritis of both hips    Multiple joint pain 04/26/2019   Pain in left foot 04/26/2019   Obstructive sleep apnea of adult 04/11/2019   Laryngopharyngeal reflux 04/11/2019     Current Outpatient Medications on File Prior to Visit  Medication Sig Dispense Refill   acetaminophen (TYLENOL) 325 MG tablet Take 1-2 tablets (325-650 mg total) by mouth every 6 (six) hours as needed for mild pain (pain score 1-3 or temp > 100.5).     amLODipine (NORVASC) 10 MG tablet Take 1 tablet (10 mg total) by mouth daily. 30 tablet 2   atorvastatin (LIPITOR) 20 MG tablet Take 1 tablet (20 mg total) by mouth daily. 30 tablet 2   ciclopirox (PENLAC) 8 % solution Apply topically at bedtime. Apply over nail and surrounding skin. Apply daily over previous coat. After seven (7) days, may remove with alcohol and continue cycle. 6.6 mL 0   EPINEPHrine 0.3 mg/0.3 mL IJ SOAJ injection  Inject 0.3 mg into the muscle as needed for anaphylaxis. 2 each 2   HYDROcodone-acetaminophen (NORCO) 10-325 MG tablet Take 1 tablet by mouth 3 (three) times daily as needed. (Patient not taking: Reported on 01/15/2023) 90 tablet 0   HYDROcodone-acetaminophen (NORCO) 10-325 MG tablet Take 1 tablet by mouth 3 (three) times daily as needed. 90 tablet 0   meloxicam (MOBIC) 15 MG tablet Take 1 tablet (15 mg total) by mouth daily. 15 tablet 0   Multiple Vitamin (MULTIVITAMIN) capsule Take 1 capsule by mouth daily.     naloxone (NARCAN) nasal spray 4 mg/0.1 mL spray 1 dose into 1 nostril, alternate nostrils with each dose until help arrives 2 each 0   Oxycodone HCl 10 MG TABS Take 1 tablet (10 mg total) by mouth 3 (three) times daily. 90 tablet 0   pantoprazole (PROTONIX) 40 MG tablet Take 1 tablet (40 mg total) by mouth 2 (two) times daily before a meal. 180 tablet 0   phentermine 15 MG capsule Take 1 capsule (15 mg total) by mouth every morning. 30 capsule 0   predniSONE (DELTASONE) 50 MG tablet Take 1 tablet (50 mg total) by mouth daily. (Patient not taking: Reported on 01/15/2023) 5 tablet 0   [DISCONTINUED] fluticasone (FLONASE) 50 MCG/ACT nasal spray Place 2 sprays into both nostrils daily. 16 g 6   [DISCONTINUED] orlistat (ALLI) 60 MG capsule Take 1 capsule (60 mg total) by mouth 3 (three) times  daily with meals. 90 capsule 1   No current facility-administered medications on file prior to visit.    Allergies  Allergen Reactions   Lisinopril Swelling and Rash    Still taking the medication at home.  Pt reports getting welts on skin around sides/stomach and elsewhere.  He also reports mild lip swell.    Social History   Socioeconomic History   Marital status: Married    Spouse name: Not on file   Number of children: Not on file   Years of education: Not on file   Highest education level: Not on file  Occupational History   Occupation: Clinical biochemist  Tobacco Use   Smoking status:  Former    Packs/day: .38    Types: Cigarettes    Quit date: 2015    Years since quitting: 9.2    Passive exposure: Past   Smokeless tobacco: Never  Vaping Use   Vaping Use: Never used  Substance and Sexual Activity   Alcohol use: Not Currently   Drug use: No   Sexual activity: Not Currently    Birth control/protection: None  Other Topics Concern   Not on file  Social History Narrative   Not on file   Social Determinants of Health   Financial Resource Strain: Not on file  Food Insecurity: Not on file  Transportation Needs: Not on file  Physical Activity: Not on file  Stress: Not on file  Social Connections: Not on file  Intimate Partner Violence: Not on file    Family History  Problem Relation Age of Onset   Rheum arthritis Mother    Hypertension Mother    Rheum arthritis Sister     Past Surgical History:  Procedure Laterality Date   HERNIA REPAIR     TOTAL KNEE ARTHROPLASTY Right 07/30/2021   Procedure: TOTAL KNEE ARTHROPLASTY;  Surgeon: Paralee Cancel, MD;  Location: [facility];  Service: Orthopedics;  Laterality: Right;    ROS: Review of Systems Negative except as stated above  PHYSICAL EXAM: There were no vitals taken for this visit.  Physical Exam  {male adult master:310786} {male adult master:310785}     Latest Ref Rng & Units 12/14/2022    4:42 AM 12/02/2022   11:30 AM 08/20/2022   11:26 AM  CMP  Glucose 70 - 99 mg/dL 103   100   BUN 6 - 20 mg/dL 9   9   Creatinine 0.61 - 1.24 mg/dL 0.69   0.68   Sodium 135 - 145 mmol/L 135   138   Potassium 3.5 - 5.1 mmol/L 4.3   4.2   Chloride 98 - 111 mmol/L 104   103   CO2 22 - 32 mmol/L 23   19   Calcium 8.9 - 10.3 mg/dL 9.2   9.5   Total Protein 6.0 - 8.5 g/dL  7.6    Total Bilirubin 0.0 - 1.2 mg/dL  0.7    Alkaline Phos 44 - 121 IU/L  120    AST 0 - 40 IU/L  36    ALT 0 - 44 IU/L  45     Lipid Panel     Component Value Date/Time   CHOL 140 11/26/2022 2028   TRIG 100 11/26/2022 2028   HDL 40  11/26/2022 2028   CHOLHDL 3.5 11/26/2022 2028   LDLCALC 81 11/26/2022 2028   LDLDIRECT 193 (H) 10/02/2020 1444    CBC    Component Value Date/Time   WBC 12.7 (H) 12/14/2022 [id number]  RBC 5.32 12/14/2022 0442   HGB 15.3 12/14/2022 0442   HGB 15.4 12/02/2022 1130   HCT 45.1 12/14/2022 0442   HCT 45.5 12/02/2022 1130   PLT 211 12/14/2022 0442   PLT 271 12/02/2022 1130   MCV 84.8 12/14/2022 0442   MCV 85 12/02/2022 1130   MCH 28.8 12/14/2022 0442   MCHC 33.9 12/14/2022 0442   RDW 13.7 12/14/2022 0442   RDW 13.2 12/02/2022 1130   LYMPHSABS 1.3 12/14/2022 0442   LYMPHSABS 1.9 12/02/2022 1130   MONOABS 1.2 (H) 12/14/2022 0442   EOSABS 0.2 12/14/2022 0442   EOSABS 0.2 12/02/2022 1130   BASOSABS 0.1 12/14/2022 0442   BASOSABS 0.1 12/02/2022 1130    ASSESSMENT AND PLAN:  There are no diagnoses linked to this encounter.   Patient was given the opportunity to ask questions.  Patient verbalized understanding of the plan and was able to repeat key elements of the plan. Patient was given clear instructions to go to Emergency Department or return to medical center if symptoms don't improve, worsen, or new problems develop.The patient verbalized understanding.   No orders of the defined types were placed in this encounter.    Requested Prescriptions    No prescriptions requested or ordered in this encounter    No follow-ups on file.  Camillia Herter, NP

## 2023-02-23 ENCOUNTER — Ambulatory Visit: Payer: No Typology Code available for payment source | Admitting: Family

## 2023-02-23 DIAGNOSIS — I10 Essential (primary) hypertension: Secondary | ICD-10-CM

## 2023-02-23 DIAGNOSIS — R7303 Prediabetes: Secondary | ICD-10-CM

## 2023-02-23 DIAGNOSIS — E785 Hyperlipidemia, unspecified: Secondary | ICD-10-CM

## 2023-02-23 NOTE — Progress Notes (Unsigned)
Patient ID: Darrell Conway, male    DOB: 09/03/1966  MRN: [id number]  CC: Chronic Care Management   Subjective: Darrell Conway is a 56 y.o. male who presents for chronic care management.   His concerns today include:  HTN - Amlodipine  HLD - Atorvastatin Predem check  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   History of total knee arthroplasty 07/30/2021   Essential hypertension 01/03/2021   Osteoarthritis of right knee 11/09/2020   Pain in joint of right knee 11/09/2020   Low back pain 10/15/2020   Irregular heart beat 02/14/2020   Personal history of COVID-19 02/13/2020   Amnesia 02/13/2020   Poor concentration 02/13/2020   Muscle pain 02/13/2020   Unilateral primary osteoarthritis, left hip 01/31/2020   Osteoarthritis of left hip 01/31/2020   Morbid obesity 06/29/2019   Primary osteoarthritis of both knees 05/05/2019   Primary osteoarthritis of both hips    Multiple joint pain 04/26/2019   Pain in left foot 04/26/2019   Obstructive sleep apnea of adult 04/11/2019   Laryngopharyngeal reflux 04/11/2019     Current Outpatient Medications on File Prior to Visit  Medication Sig Dispense Refill   acetaminophen (TYLENOL) 325 MG tablet Take 1-2 tablets (325-650 mg total) by mouth every 6 (six) hours as needed for mild pain (pain score 1-3 or temp > 100.5).     amLODipine (NORVASC) 10 MG tablet Take 1 tablet (10 mg total) by mouth daily. 30 tablet 2   atorvastatin (LIPITOR) 20 MG tablet Take 1 tablet (20 mg total) by mouth daily. 30 tablet 2   ciclopirox (PENLAC) 8 % solution Apply topically at bedtime. Apply over nail and surrounding skin. Apply daily over previous coat. After seven (7) days, may remove with alcohol and continue cycle. 6.6 mL 0   EPINEPHrine 0.3 mg/0.3 mL IJ SOAJ  injection Inject 0.3 mg into the muscle as needed for anaphylaxis. 2 each 2   HYDROcodone-acetaminophen (NORCO) 10-325 MG tablet Take 1 tablet by mouth 3 (three) times daily as needed. (Patient not taking: Reported on 01/15/2023) 90 tablet 0   HYDROcodone-acetaminophen (NORCO) 10-325 MG tablet Take 1 tablet by mouth 3 (three) times daily as needed. 90 tablet 0   meloxicam (MOBIC) 15 MG tablet Take 1 tablet (15 mg total) by mouth daily. 15 tablet 0   Multiple Vitamin (MULTIVITAMIN) capsule Take 1 capsule by mouth daily.     naloxone (NARCAN) nasal spray 4 mg/0.1 mL spray 1 dose into 1 nostril, alternate nostrils with each dose until help arrives 2 each 0   Oxycodone HCl 10 MG TABS Take 1 tablet (10 mg total) by mouth 3 (three) times daily. 90 tablet 0   pantoprazole (PROTONIX) 40 MG tablet Take 1 tablet (40 mg total) by mouth 2 (two) times daily before a meal. 180 tablet 0   phentermine 15 MG capsule Take 1 capsule (15 mg total) by mouth every morning. 30 capsule 0   predniSONE (DELTASONE) 50 MG tablet Take 1 tablet (50 mg total) by mouth daily. (Patient not taking: Reported on 01/15/2023) 5 tablet 0   [DISCONTINUED] fluticasone (FLONASE) 50 MCG/ACT nasal spray Place 2 sprays into both nostrils daily. 16 g 6   [DISCONTINUED] orlistat (ALLI) 60 MG capsule Take 1 capsule (60 mg total) by mouth 3 (three)  times daily with meals. 90 capsule 1   No current facility-administered medications on file prior to visit.    Allergies  Allergen Reactions   Lisinopril Swelling and Rash    Still taking the medication at home.  Pt reports getting welts on skin around sides/stomach and elsewhere.  He also reports mild lip swell.    Social History   Socioeconomic History   Marital status: Married    Spouse name: Not on file   Number of children: Not on file   Years of education: Not on file   Highest education level: Some college, no degree  Occupational History   Occupation: Clinical biochemist  Tobacco Use    Smoking status: Former    Packs/day: .92    Types: Cigarettes    Quit date: 2015    Years since quitting: 9.2    Passive exposure: Past   Smokeless tobacco: Never  Vaping Use   Vaping Use: Never used  Substance and Sexual Activity   Alcohol use: Not Currently   Drug use: No   Sexual activity: Not Currently    Birth control/protection: None  Other Topics Concern   Not on file  Social History Narrative   Not on file   Social Determinants of Health   Financial Resource Strain: Low Risk  (02/23/2023)   Overall Financial Resource Strain (CARDIA)    Difficulty of Paying Living Expenses: Not hard at all  Food Insecurity: No Food Insecurity (02/23/2023)   Hunger Vital Sign    Worried About Running Out of Food in the Last Year: Never true    [place] in the Last Year: Never true  Transportation Needs: No Transportation Needs (02/23/2023)   PRAPARE - Hydrologist (Medical): No    Lack of Transportation (Non-Medical): No  Physical Activity: Insufficiently Active (02/23/2023)   Exercise Vital Sign    Days of Exercise per Week: 2 days    Minutes of Exercise per Session: 30 min  Stress: No Stress Concern Present (02/23/2023)   [place]    Feeling of Stress : Not at all  Social Connections: Moderately Integrated (02/23/2023)   Social Connection and Isolation Panel [NHANES]    Frequency of Communication with Friends and Family: More than three times a week    Frequency of Social Gatherings with Friends and Family: Three times a week    Attends Religious Services: 1 to 4 times per year    Active Member of Clubs or Organizations: No    Attends Music therapist: Not on file    Marital Status: Married  Human resources officer Violence: Not on file    Family History  Problem Relation Age of Onset   Rheum arthritis Mother    Hypertension Mother    Rheum arthritis Sister     Past  Surgical History:  Procedure Laterality Date   HERNIA REPAIR     TOTAL KNEE ARTHROPLASTY Right 07/30/2021   Procedure: TOTAL KNEE ARTHROPLASTY;  Surgeon: Paralee Cancel, MD;  Location: [facility];  Service: Orthopedics;  Laterality: Right;    ROS: Review of Systems Negative except as stated above  PHYSICAL EXAM: There were no vitals taken for this visit.  Physical Exam  {male adult master:310786} {male adult master:310785}     Latest Ref Rng & Units 12/14/2022    4:42 AM 12/02/2022   11:30 AM 08/20/2022   11:26 AM  CMP  Glucose 70 -  99 mg/dL 103   100   BUN 6 - 20 mg/dL 9   9   Creatinine 0.61 - 1.24 mg/dL 0.69   0.68   Sodium 135 - 145 mmol/L 135   138   Potassium 3.5 - 5.1 mmol/L 4.3   4.2   Chloride 98 - 111 mmol/L 104   103   CO2 22 - 32 mmol/L 23   19   Calcium 8.9 - 10.3 mg/dL 9.2   9.5   Total Protein 6.0 - 8.5 g/dL  7.6    Total Bilirubin 0.0 - 1.2 mg/dL  0.7    Alkaline Phos 44 - 121 IU/L  120    AST 0 - 40 IU/L  36    ALT 0 - 44 IU/L  45     Lipid Panel     Component Value Date/Time   CHOL 140 11/26/2022 2028   TRIG 100 11/26/2022 2028   HDL 40 11/26/2022 2028   CHOLHDL 3.5 11/26/2022 2028   LDLCALC 81 11/26/2022 2028   LDLDIRECT 193 (H) 10/02/2020 1444    CBC    Component Value Date/Time   WBC 12.7 (H) 12/14/2022 0442   RBC 5.32 12/14/2022 0442   HGB 15.3 12/14/2022 0442   HGB 15.4 12/02/2022 1130   HCT 45.1 12/14/2022 0442   HCT 45.5 12/02/2022 1130   PLT 211 12/14/2022 0442   PLT 271 12/02/2022 1130   MCV 84.8 12/14/2022 0442   MCV 85 12/02/2022 1130   MCH 28.8 12/14/2022 0442   MCHC 33.9 12/14/2022 0442   RDW 13.7 12/14/2022 0442   RDW 13.2 12/02/2022 1130   LYMPHSABS 1.3 12/14/2022 0442   LYMPHSABS 1.9 12/02/2022 1130   MONOABS 1.2 (H) 12/14/2022 0442   EOSABS 0.2 12/14/2022 0442   EOSABS 0.2 12/02/2022 1130   BASOSABS 0.1 12/14/2022 0442   BASOSABS 0.1 12/02/2022 1130    ASSESSMENT AND PLAN:  There are no diagnoses linked to this  encounter.   Patient was given the opportunity to ask questions.  Patient verbalized understanding of the plan and was able to repeat key elements of the plan. Patient was given clear instructions to go to Emergency Department or return to medical center if symptoms don't improve, worsen, or new problems develop.The patient verbalized understanding.   No orders of the defined types were placed in this encounter.    Requested Prescriptions    No prescriptions requested or ordered in this encounter    No follow-ups on file.  Camillia Herter, NP

## 2023-02-24 ENCOUNTER — Other Ambulatory Visit (HOSPITAL_COMMUNITY): Payer: Self-pay

## 2023-02-24 MED ORDER — HYDROCODONE-ACETAMINOPHEN 10-325 MG PO TABS
1.0000 | ORAL_TABLET | Freq: Three times a day (TID) | ORAL | 0 refills | Status: DC | PRN
  Filled 2023-02-24: qty 90, 30d supply, fill #0

## 2023-02-24 MED ORDER — MELOXICAM 15 MG PO TABS
15.0000 mg | ORAL_TABLET | Freq: Every day | ORAL | 0 refills | Status: DC
  Filled 2023-02-24: qty 15, 15d supply, fill #0

## 2023-02-26 ENCOUNTER — Encounter: Payer: Self-pay | Admitting: Family

## 2023-02-26 ENCOUNTER — Other Ambulatory Visit (HOSPITAL_COMMUNITY): Payer: Self-pay

## 2023-02-26 ENCOUNTER — Ambulatory Visit: Payer: No Typology Code available for payment source | Admitting: Family

## 2023-02-26 VITALS — BP 141/91 | HR 75 | Temp 98.5°F | Ht 72.0 in | Wt 302.0 lb

## 2023-02-26 DIAGNOSIS — E785 Hyperlipidemia, unspecified: Secondary | ICD-10-CM

## 2023-02-26 DIAGNOSIS — R7303 Prediabetes: Secondary | ICD-10-CM | POA: Diagnosis not present

## 2023-02-26 DIAGNOSIS — I10 Essential (primary) hypertension: Secondary | ICD-10-CM

## 2023-02-26 LAB — POCT GLYCOSYLATED HEMOGLOBIN (HGB A1C): HbA1c, POC (prediabetic range): 5.9 % (ref 5.7–6.4)

## 2023-02-26 MED ORDER — VALSARTAN 40 MG PO TABS
40.0000 mg | ORAL_TABLET | Freq: Every day | ORAL | 0 refills | Status: DC
  Filled 2023-02-26: qty 30, 30d supply, fill #0

## 2023-02-26 MED ORDER — ATORVASTATIN CALCIUM 20 MG PO TABS
20.0000 mg | ORAL_TABLET | Freq: Every day | ORAL | 0 refills | Status: DC
  Filled 2023-02-26: qty 30, 30d supply, fill #0
  Filled 2023-03-27: qty 30, 30d supply, fill #1
  Filled 2023-04-16: qty 30, 30d supply, fill #2

## 2023-02-26 MED ORDER — AMLODIPINE BESYLATE 10 MG PO TABS
10.0000 mg | ORAL_TABLET | Freq: Every day | ORAL | 0 refills | Status: DC
  Filled 2023-02-26: qty 30, 30d supply, fill #0
  Filled 2023-03-27: qty 30, 30d supply, fill #1
  Filled 2023-04-16: qty 30, 30d supply, fill #2

## 2023-02-26 NOTE — Progress Notes (Signed)
Discussed with patient

## 2023-02-26 NOTE — Progress Notes (Signed)
Pt here for chronic care management   No questions or concerns at this time

## 2023-03-03 ENCOUNTER — Ambulatory Visit: Payer: No Typology Code available for payment source | Admitting: Podiatry

## 2023-03-09 ENCOUNTER — Telehealth: Payer: Self-pay | Admitting: Family

## 2023-03-09 ENCOUNTER — Other Ambulatory Visit (HOSPITAL_COMMUNITY): Payer: Self-pay

## 2023-03-09 NOTE — Telephone Encounter (Signed)
Pt's wife is calling in because pt had a visit with Amy almost a month ago and per pt's wife, Amy was supposed to prescribe him Colchicine for his gout. Pt's wife says the medication was never sent to the pharmacy and wants to know when it will be sent, and if it was sent where was it sent. Pt's wife is requesting a callback when the medication is sent. Please follow up.

## 2023-03-10 ENCOUNTER — Other Ambulatory Visit (HOSPITAL_COMMUNITY): Payer: Self-pay

## 2023-03-10 ENCOUNTER — Other Ambulatory Visit: Payer: Self-pay | Admitting: Family

## 2023-03-10 DIAGNOSIS — M109 Gout, unspecified: Secondary | ICD-10-CM

## 2023-03-10 MED ORDER — COLCHICINE 0.6 MG PO TABS
0.6000 mg | ORAL_TABLET | Freq: Every day | ORAL | 0 refills | Status: DC
  Filled 2023-03-10: qty 30, 30d supply, fill #0

## 2023-03-10 NOTE — Telephone Encounter (Signed)
Colchicine prescribed

## 2023-03-11 ENCOUNTER — Other Ambulatory Visit (HOSPITAL_COMMUNITY): Payer: Self-pay

## 2023-03-11 ENCOUNTER — Ambulatory Visit (INDEPENDENT_AMBULATORY_CARE_PROVIDER_SITE_OTHER): Payer: No Typology Code available for payment source | Admitting: Primary Care

## 2023-03-11 DIAGNOSIS — G4733 Obstructive sleep apnea (adult) (pediatric): Secondary | ICD-10-CM

## 2023-03-16 NOTE — Progress Notes (Signed)
Please let patient know home sleep study on 02/26/23 showed moderate obstructive sleep apnea, he had on average 16 apneic events an hour. If he would like to resume therapy please place an order for him to receive new CPAP unit pressure settings 5 to 15 cm H2O with mask of choice and heated humidity.

## 2023-03-18 ENCOUNTER — Other Ambulatory Visit: Payer: Self-pay | Admitting: *Deleted

## 2023-03-18 DIAGNOSIS — G4733 Obstructive sleep apnea (adult) (pediatric): Secondary | ICD-10-CM

## 2023-03-23 ENCOUNTER — Telehealth: Payer: Self-pay | Admitting: Pharmacist

## 2023-03-23 NOTE — Progress Notes (Signed)
Patient attempted to be outreached by Natalie Elchert, PharmD Candidate on 03/23/2023 to discuss hypertension. Left voicemail for patient to return our call at their convenience at [phone].   Natalie Elchert, PharmD Candidate   Catie T. Jonhatan Hearty, PharmD, BCACP, CPP [facility] [phone]   

## 2023-03-23 NOTE — Telephone Encounter (Deleted)
-----   Message from Karlton Lemon, [place] sent at 03/23/2023  2:04 PM EDT ----- Regarding: HTN Clinic Patient attempted to be outreached by Karlton Lemon, PharmD Candidate on 03/23/2023 to discuss hypertension. Left voicemail for patient to return our call at their convenience at [phone].  Karlton Lemon, PharmD Candidate

## 2023-03-26 ENCOUNTER — Other Ambulatory Visit (HOSPITAL_COMMUNITY): Payer: Self-pay

## 2023-03-26 MED ORDER — HYDROCODONE-ACETAMINOPHEN 10-325 MG PO TABS
1.0000 | ORAL_TABLET | Freq: Three times a day (TID) | ORAL | 0 refills | Status: DC | PRN
  Filled 2023-03-26: qty 90, 30d supply, fill #0

## 2023-03-26 MED ORDER — MELOXICAM 15 MG PO TABS
15.0000 mg | ORAL_TABLET | Freq: Every day | ORAL | 0 refills | Status: DC
  Filled 2023-03-26: qty 15, 15d supply, fill #0

## 2023-03-27 ENCOUNTER — Other Ambulatory Visit: Payer: Self-pay | Admitting: Family

## 2023-03-27 DIAGNOSIS — I10 Essential (primary) hypertension: Secondary | ICD-10-CM

## 2023-03-30 ENCOUNTER — Other Ambulatory Visit (HOSPITAL_COMMUNITY): Payer: Self-pay

## 2023-03-30 MED ORDER — VALSARTAN 40 MG PO TABS
40.0000 mg | ORAL_TABLET | Freq: Every day | ORAL | 0 refills | Status: DC
  Filled 2023-03-30: qty 30, 30d supply, fill #0
  Filled 2023-04-16: qty 30, 30d supply, fill #1
  Filled 2023-05-29: qty 30, 30d supply, fill #2

## 2023-03-30 NOTE — Telephone Encounter (Signed)
Requested Prescriptions  Pending Prescriptions Disp Refills   valsartan (DIOVAN) 40 MG tablet 90 tablet 0    Sig: Take 1 tablet (40 mg total) by mouth daily.     Cardiovascular:  Angiotensin Receptor Blockers Failed - 03/27/2023  7:49 PM      Failed - Last BP in normal range    BP Readings from Last 1 Encounters:  02/26/23 (!) 141/91         Passed - Cr in normal range and within 180 days    Creatinine, Ser  Date Value Ref Range Status  12/14/2022 0.69 0.61 - 1.24 mg/dL Final         Passed - K in normal range and within 180 days    Potassium  Date Value Ref Range Status  12/14/2022 4.3 3.5 - 5.1 mmol/L Final    Comment:    HEMOLYSIS AT THIS LEVEL MAY AFFECT RESULT         Passed - Patient is not pregnant      Passed - Valid encounter within last 6 months    Recent Outpatient Visits           1 month ago Primary hypertension   [place] [facility], Amy J, NP   3 months ago Nonspecific chest pain   [place] [facility], Amy J, NP   4 months ago Primary hypertension   [place] [facility], Amy J, NP   7 months ago OSA on CPAP   [place] [facility], [place], NP   1 year ago History of anaphylaxis   [facility], Salomon Fick, NP       Future Appointments             In 9 months Deirdre Evener, MD [facility]

## 2023-04-16 ENCOUNTER — Other Ambulatory Visit: Payer: Self-pay | Admitting: Family

## 2023-04-16 DIAGNOSIS — M109 Gout, unspecified: Secondary | ICD-10-CM

## 2023-04-17 ENCOUNTER — Other Ambulatory Visit (HOSPITAL_COMMUNITY): Payer: Self-pay

## 2023-04-17 MED ORDER — COLCHICINE 0.6 MG PO TABS
0.6000 mg | ORAL_TABLET | Freq: Every day | ORAL | 3 refills | Status: DC
  Filled 2023-04-17: qty 30, 30d supply, fill #0
  Filled 2023-05-29: qty 30, 30d supply, fill #1
  Filled 2023-06-25: qty 30, 30d supply, fill #2
  Filled 2023-07-29: qty 30, 30d supply, fill #3
  Filled 2023-10-07: qty 30, 30d supply, fill #4
  Filled 2023-10-27: qty 30, 30d supply, fill #5
  Filled 2023-11-30: qty 30, 30d supply, fill #6

## 2023-04-17 NOTE — Telephone Encounter (Signed)
Requested Prescriptions  Pending Prescriptions Disp Refills   colchicine 0.6 MG tablet 90 tablet 3    Sig: Take 1 tablet (0.6 mg total) by mouth daily.     Endocrinology:  Gout Agents - colchicine Failed - 04/16/2023  9:51 PM      Failed - ALT in normal range and within 360 days    ALT  Date Value Ref Range Status  12/02/2022 45 (H) 0 - 44 IU/L Final         Passed - Cr in normal range and within 360 days    Creatinine, Ser  Date Value Ref Range Status  12/14/2022 0.69 0.61 - 1.24 mg/dL Final         Passed - AST in normal range and within 360 days    AST  Date Value Ref Range Status  12/02/2022 36 0 - 40 IU/L Final         Passed - Valid encounter within last 12 months    Recent Outpatient Visits           1 month ago Primary hypertension   [place] [facility], Amy J, NP   3 months ago Nonspecific chest pain   [place] [facility], Amy J, NP   4 months ago Primary hypertension   [place] [facility], Amy J, NP   8 months ago OSA on CPAP   [place] [facility], [place], NP   1 year ago History of anaphylaxis   [place] [facility], [place], NP       Future Appointments             In 8 months Deirdre Evener, MD [facility]            Passed - CBC within normal limits and completed in the last 12 months    WBC  Date Value Ref Range Status  12/14/2022 12.7 (H) 4.0 - 10.5 K/uL Final   RBC  Date Value Ref Range Status  12/14/2022 5.32 4.22 - 5.81 MIL/uL Final   Hemoglobin  Date Value Ref Range Status  12/14/2022 15.3 13.0 - 17.0 g/dL Final  16/08/9603 54.0 13.0 - 17.7 g/dL Final   HCT  Date Value Ref Range Status  12/14/2022 45.1 39.0 - 52.0 % Final   Hematocrit  Date Value Ref Range Status  12/02/2022 45.5 37.5 - 51.0 % Final   MCHC  Date Value Ref Range Status  12/14/2022  33.9 30.0 - 36.0 g/dL Final   [facility]  Date Value Ref Range Status  12/14/2022 28.8 26.0 - 34.0 pg Final   MCV  Date Value Ref Range Status  12/14/2022 84.8 80.0 - 100.0 fL Final  12/02/2022 85 79 - 97 fL Final   No results found for: "PLTCOUNTKUC", "LABPLAT", "POCPLA" RDW  Date Value Ref Range Status  12/14/2022 13.7 11.5 - 15.5 % Final  12/02/2022 13.2 11.6 - 15.4 % Final

## 2023-04-30 ENCOUNTER — Other Ambulatory Visit (HOSPITAL_COMMUNITY): Payer: Self-pay

## 2023-04-30 MED ORDER — MELOXICAM 15 MG PO TABS
15.0000 mg | ORAL_TABLET | Freq: Every day | ORAL | 0 refills | Status: DC
  Filled 2023-04-30 – 2023-05-29 (×2): qty 15, 15d supply, fill #0

## 2023-04-30 MED ORDER — HYDROCODONE-ACETAMINOPHEN 10-325 MG PO TABS
ORAL_TABLET | ORAL | 0 refills | Status: DC
  Filled 2023-04-30: qty 90, 30d supply, fill #0

## 2023-05-26 ENCOUNTER — Other Ambulatory Visit: Payer: Self-pay

## 2023-05-26 ENCOUNTER — Other Ambulatory Visit: Payer: Self-pay | Admitting: Family

## 2023-05-26 ENCOUNTER — Other Ambulatory Visit (HOSPITAL_COMMUNITY): Payer: Self-pay

## 2023-05-26 DIAGNOSIS — I10 Essential (primary) hypertension: Secondary | ICD-10-CM

## 2023-05-26 MED ORDER — MELOXICAM 15 MG PO TABS
15.0000 mg | ORAL_TABLET | Freq: Every day | ORAL | 0 refills | Status: DC
  Filled 2023-05-26: qty 15, 15d supply, fill #0

## 2023-05-26 MED ORDER — AMLODIPINE BESYLATE 10 MG PO TABS
10.0000 mg | ORAL_TABLET | Freq: Every day | ORAL | 0 refills | Status: DC
Start: 2023-05-26 — End: 2023-09-02
  Filled 2023-05-26: qty 30, 30d supply, fill #0
  Filled 2023-06-25: qty 30, 30d supply, fill #1
  Filled 2023-07-29: qty 30, 30d supply, fill #2

## 2023-05-26 MED ORDER — HYDROCODONE-ACETAMINOPHEN 10-325 MG PO TABS
1.0000 | ORAL_TABLET | Freq: Three times a day (TID) | ORAL | 0 refills | Status: DC | PRN
  Filled 2023-05-26 (×2): qty 90, 30d supply, fill #0

## 2023-05-29 ENCOUNTER — Other Ambulatory Visit: Payer: Self-pay | Admitting: Family

## 2023-05-29 ENCOUNTER — Other Ambulatory Visit: Payer: Self-pay

## 2023-05-29 ENCOUNTER — Other Ambulatory Visit (HOSPITAL_COMMUNITY): Payer: Self-pay

## 2023-05-29 DIAGNOSIS — E785 Hyperlipidemia, unspecified: Secondary | ICD-10-CM

## 2023-05-29 MED ORDER — ATORVASTATIN CALCIUM 20 MG PO TABS
20.0000 mg | ORAL_TABLET | Freq: Every day | ORAL | 0 refills | Status: DC
  Filled 2023-05-29: qty 30, 30d supply, fill #0
  Filled 2023-06-25: qty 30, 30d supply, fill #1
  Filled 2023-07-29: qty 30, 30d supply, fill #2

## 2023-06-06 ENCOUNTER — Other Ambulatory Visit (HOSPITAL_COMMUNITY): Payer: Self-pay

## 2023-06-25 ENCOUNTER — Other Ambulatory Visit (HOSPITAL_COMMUNITY): Payer: Self-pay

## 2023-06-25 ENCOUNTER — Other Ambulatory Visit: Payer: Self-pay

## 2023-06-25 ENCOUNTER — Other Ambulatory Visit: Payer: Self-pay | Admitting: Family

## 2023-06-25 DIAGNOSIS — K219 Gastro-esophageal reflux disease without esophagitis: Secondary | ICD-10-CM

## 2023-06-25 DIAGNOSIS — I10 Essential (primary) hypertension: Secondary | ICD-10-CM

## 2023-06-25 MED ORDER — VALSARTAN 40 MG PO TABS
40.0000 mg | ORAL_TABLET | Freq: Every day | ORAL | 0 refills | Status: DC
Start: 2023-06-25 — End: 2023-09-02
  Filled 2023-06-25: qty 30, 30d supply, fill #0
  Filled 2023-07-29: qty 30, 30d supply, fill #1

## 2023-06-25 MED ORDER — PANTOPRAZOLE SODIUM 40 MG PO TBEC
40.0000 mg | DELAYED_RELEASE_TABLET | Freq: Two times a day (BID) | ORAL | 0 refills | Status: DC
Start: 2023-06-25 — End: 2023-10-27
  Filled 2023-06-25: qty 60, 30d supply, fill #0
  Filled 2023-07-29: qty 60, 30d supply, fill #1
  Filled 2023-10-07: qty 60, 30d supply, fill #2

## 2023-06-26 ENCOUNTER — Other Ambulatory Visit (HOSPITAL_COMMUNITY): Payer: Self-pay

## 2023-06-26 MED ORDER — HYDROCODONE-ACETAMINOPHEN 10-325 MG PO TABS
ORAL_TABLET | ORAL | 0 refills | Status: DC
  Filled 2023-06-26: qty 90, 30d supply, fill #0

## 2023-06-27 ENCOUNTER — Other Ambulatory Visit (HOSPITAL_COMMUNITY): Payer: Self-pay

## 2023-07-24 ENCOUNTER — Other Ambulatory Visit (HOSPITAL_COMMUNITY): Payer: Self-pay

## 2023-07-24 MED ORDER — HYDROCODONE-ACETAMINOPHEN 10-325 MG PO TABS
1.0000 | ORAL_TABLET | Freq: Three times a day (TID) | ORAL | 0 refills | Status: DC | PRN
  Filled 2023-07-24 – 2023-07-25 (×3): qty 90, 30d supply, fill #0

## 2023-07-24 MED ORDER — MELOXICAM 15 MG PO TABS
15.0000 mg | ORAL_TABLET | Freq: Every day | ORAL | 0 refills | Status: DC
  Filled 2023-07-24: qty 15, 15d supply, fill #0

## 2023-07-25 ENCOUNTER — Other Ambulatory Visit (HOSPITAL_COMMUNITY): Payer: Self-pay

## 2023-07-29 ENCOUNTER — Other Ambulatory Visit (HOSPITAL_COMMUNITY): Payer: Self-pay

## 2023-08-04 ENCOUNTER — Other Ambulatory Visit (HOSPITAL_COMMUNITY): Payer: Self-pay

## 2023-08-10 ENCOUNTER — Other Ambulatory Visit: Payer: Self-pay

## 2023-08-10 NOTE — Progress Notes (Signed)
Petter Sward October 14, 1966 [id number]  Patient attempted to be outreached by Thomasene Ripple, PharmD Candidate to discuss hypertension. Left voicemail for patient to return our call at their convenience at [phone].  Thomasene Ripple, Student-PharmD

## 2023-08-24 ENCOUNTER — Other Ambulatory Visit (HOSPITAL_COMMUNITY): Payer: Self-pay

## 2023-08-24 MED ORDER — HYDROCODONE-ACETAMINOPHEN 10-325 MG PO TABS
1.0000 | ORAL_TABLET | Freq: Three times a day (TID) | ORAL | 0 refills | Status: DC | PRN
  Filled 2023-08-24: qty 90, 30d supply, fill #0

## 2023-08-26 ENCOUNTER — Other Ambulatory Visit (HOSPITAL_COMMUNITY): Payer: Self-pay

## 2023-08-26 MED ORDER — IBUPROFEN 400 MG PO TABS
800.0000 mg | ORAL_TABLET | Freq: Four times a day (QID) | ORAL | 0 refills | Status: DC | PRN
  Filled 2023-08-26: qty 25, 3d supply, fill #0

## 2023-08-26 MED ORDER — CYCLOBENZAPRINE HCL 10 MG PO TABS
10.0000 mg | ORAL_TABLET | Freq: Three times a day (TID) | ORAL | 0 refills | Status: DC | PRN
  Filled 2023-08-26 – 2023-11-30 (×2): qty 15, 5d supply, fill #0

## 2023-08-27 ENCOUNTER — Other Ambulatory Visit (HOSPITAL_COMMUNITY): Payer: Self-pay

## 2023-08-27 MED ORDER — LIDOCAINE 5 % EX PTCH
MEDICATED_PATCH | CUTANEOUS | 0 refills | Status: DC
  Filled 2023-08-27: qty 30, 30d supply, fill #0

## 2023-08-28 ENCOUNTER — Ambulatory Visit
Admission: EM | Admit: 2023-08-28 | Discharge: 2023-08-28 | Disposition: A | Discharge status: Home / Self Care | Payer: No Typology Code available for payment source

## 2023-08-28 DIAGNOSIS — M542 Cervicalgia: Secondary | ICD-10-CM
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
## 2023-08-28 NOTE — Discharge Instructions (Signed)
Pick up your medications from pharmacy that were prescribed by the ER as they will be helpful.  Follow-up with orthopedist if symptoms persist or worsen.
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
## 2023-08-29 ENCOUNTER — Other Ambulatory Visit (HOSPITAL_COMMUNITY): Payer: Self-pay

## 2023-09-01 ENCOUNTER — Other Ambulatory Visit (HOSPITAL_COMMUNITY): Payer: Self-pay

## 2023-09-02 ENCOUNTER — Other Ambulatory Visit (HOSPITAL_COMMUNITY): Payer: Self-pay

## 2023-09-02 ENCOUNTER — Other Ambulatory Visit: Payer: Self-pay | Admitting: *Deleted

## 2023-09-02 DIAGNOSIS — E785 Hyperlipidemia, unspecified: Secondary | ICD-10-CM

## 2023-09-02 DIAGNOSIS — I10 Essential (primary) hypertension: Secondary | ICD-10-CM

## 2023-09-02 MED ORDER — VALSARTAN 40 MG PO TABS
40.0000 mg | ORAL_TABLET | Freq: Every day | ORAL | 0 refills | Status: DC
  Filled 2023-09-02: qty 30, 30d supply, fill #0

## 2023-09-02 MED ORDER — ATORVASTATIN CALCIUM 20 MG PO TABS
20.0000 mg | ORAL_TABLET | Freq: Every day | ORAL | 0 refills | Status: DC
  Filled 2023-09-02: qty 30, 30d supply, fill #0

## 2023-09-02 MED ORDER — AMLODIPINE BESYLATE 10 MG PO TABS
10.0000 mg | ORAL_TABLET | Freq: Every day | ORAL | 0 refills | Status: DC
Start: 2023-09-02 — End: 2023-09-15
  Filled 2023-09-02: qty 30, 30d supply, fill #0

## 2023-09-03 ENCOUNTER — Encounter: Payer: Self-pay | Admitting: Physician Assistant

## 2023-09-03 ENCOUNTER — Ambulatory Visit (INDEPENDENT_AMBULATORY_CARE_PROVIDER_SITE_OTHER): Payer: No Typology Code available for payment source | Admitting: Physician Assistant

## 2023-09-03 ENCOUNTER — Other Ambulatory Visit (HOSPITAL_COMMUNITY): Payer: Self-pay

## 2023-09-03 ENCOUNTER — Other Ambulatory Visit (INDEPENDENT_AMBULATORY_CARE_PROVIDER_SITE_OTHER): Payer: No Typology Code available for payment source

## 2023-09-03 DIAGNOSIS — M542 Cervicalgia: Secondary | ICD-10-CM | POA: Diagnosis not present

## 2023-09-03 MED ORDER — METHYLPREDNISOLONE 4 MG PO TBPK
ORAL_TABLET | ORAL | 0 refills | Status: DC
  Filled 2023-09-03 (×2): qty 21, 6d supply, fill #0
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
## 2023-09-15 ENCOUNTER — Encounter: Payer: Self-pay | Admitting: Family

## 2023-09-15 ENCOUNTER — Other Ambulatory Visit (HOSPITAL_COMMUNITY): Payer: Self-pay

## 2023-09-15 ENCOUNTER — Ambulatory Visit: Payer: No Typology Code available for payment source | Admitting: Family

## 2023-09-15 VITALS — BP 120/82 | HR 79 | Temp 98.9°F | Ht 72.0 in | Wt 306.8 lb

## 2023-09-15 DIAGNOSIS — I10 Essential (primary) hypertension: Secondary | ICD-10-CM

## 2023-09-15 DIAGNOSIS — R7303 Prediabetes: Secondary | ICD-10-CM

## 2023-09-15 DIAGNOSIS — Z23 Encounter for immunization: Secondary | ICD-10-CM | POA: Diagnosis not present

## 2023-09-15 DIAGNOSIS — E785 Hyperlipidemia, unspecified: Secondary | ICD-10-CM

## 2023-09-15 MED ORDER — VALSARTAN 40 MG PO TABS
40.0000 mg | ORAL_TABLET | Freq: Every day | ORAL | 0 refills | Status: DC
Start: 2023-09-15 — End: 2024-01-01
  Filled 2023-09-15 – 2023-10-07 (×2): qty 30, 30d supply, fill #0
  Filled 2023-10-27: qty 30, 30d supply, fill #1
  Filled 2023-11-30: qty 30, 30d supply, fill #2

## 2023-09-15 MED ORDER — ATORVASTATIN CALCIUM 20 MG PO TABS
20.0000 mg | ORAL_TABLET | Freq: Every day | ORAL | 0 refills | Status: DC
Start: 2023-09-15 — End: 2023-12-14
  Filled 2023-09-15: qty 30, 30d supply, fill #0

## 2023-09-15 MED ORDER — AMLODIPINE BESYLATE 10 MG PO TABS
10.0000 mg | ORAL_TABLET | Freq: Every day | ORAL | 0 refills | Status: DC
Start: 2023-09-15 — End: 2024-01-01
  Filled 2023-09-15 – 2023-10-07 (×2): qty 30, 30d supply, fill #0
  Filled 2023-10-27: qty 30, 30d supply, fill #1
  Filled 2023-11-30: qty 30, 30d supply, fill #2

## 2023-09-15 NOTE — Progress Notes (Signed)
Patient states no concerns to speak about.  Patient wants Flu vaccine

## 2023-09-15 NOTE — Progress Notes (Signed)
Patient ID: Darrell Conway, male    DOB: Jan 31, 1966  MRN: [id number]  CC: Chronic Conditions Follow-Up  Subjective: Darrell Conway is a 57 y.o. male who presents for chronic conditions follow-up.   His concerns today include:  - Doing well on Amlodipine and Valsartan, no issues/concerns. He does not complain of red flag symptoms such as but not limited to chest pain, shortness of breath, worst headache of life, nausea/vomiting.  - Doing well on Atorvastatin, no issues/concerns.  Patient Active Problem List   Diagnosis Date Noted   Neck pain 09/03/2023   Hypertensive disorder 05/23/2022   Gout 05/23/2022   Osteoarthritis of left knee 01/19/2022   Chronic pain of left ankle 12/17/2021   S/P total knee arthroplasty, right 07/30/2021   History of total knee arthroplasty 07/30/2021   Essential hypertension 01/03/2021   Osteoarthritis of right knee 11/09/2020   Pain in joint of right knee 11/09/2020   Low back pain 10/15/2020   Irregular heart beat 02/14/2020   Personal history of COVID-19 02/13/2020   Amnesia 02/13/2020   Poor concentration 02/13/2020   Muscle pain 02/13/2020   Unilateral primary osteoarthritis, left hip 01/31/2020   Osteoarthritis of left hip 01/31/2020   Morbid obesity (HCC) 06/29/2019   Primary osteoarthritis of both knees 05/05/2019   Primary osteoarthritis of both hips    Multiple joint pain 04/26/2019   Pain in left foot 04/26/2019   Obstructive sleep apnea of adult 04/11/2019   Laryngopharyngeal reflux 04/11/2019     Current Outpatient Medications on File Prior to Visit  Medication Sig Dispense Refill   colchicine 0.6 MG tablet Take 1 tablet (0.6 mg total) by mouth daily. 90 tablet 3   meloxicam (MOBIC) 15 MG tablet Take 1 tablet (15 mg total) by mouth daily. 15 tablet 0   Multiple Vitamin (MULTIVITAMIN) capsule Take 1 capsule by mouth daily.     pantoprazole (PROTONIX) 40 MG tablet Take 1 tablet (40 mg total) by mouth 2 (two) times daily before a  meal. 180 tablet 0   acetaminophen (TYLENOL) 325 MG tablet Take 1-2 tablets (325-650 mg total) by mouth every 6 (six) hours as needed for mild pain (pain score 1-3 or temp > 100.5).     cyclobenzaprine (FLEXERIL) 10 MG tablet Take 1 tablet (10 mg total) by mouth every 8 (eight) hours as needed for up to 10 days. 15 tablet 0   EPINEPHrine 0.3 mg/0.3 mL IJ SOAJ injection Inject 0.3 mg into the muscle as needed for anaphylaxis. 2 each 2   HYDROcodone-acetaminophen (NORCO) 10-325 MG tablet Take 1 tablet by mouth 3 (three) times daily as needed. (Patient not taking: Reported on 01/15/2023) 90 tablet 0   HYDROcodone-acetaminophen (NORCO) 10-325 MG tablet Take 1 tablet by mouth 3 (three) times daily as needed. 90 tablet 0   [DISCONTINUED] fluticasone (FLONASE) 50 MCG/ACT nasal spray Place 2 sprays into both nostrils daily. 16 g 6   [DISCONTINUED] orlistat (ALLI) 60 MG capsule Take 1 capsule (60 mg total) by mouth 3 (three) times daily with meals. 90 capsule 1   No current facility-administered medications on file prior to visit.    Allergies  Allergen Reactions   Lisinopril Swelling and Rash    Still taking the medication at home.  Pt reports getting welts on skin around sides/stomach and elsewhere.  He also reports mild lip swell.    Social History   Socioeconomic History   Marital status: Married    Spouse name: Not on file  Number of children: Not on file   Years of education: Not on file   Highest education level: Some college, no degree  Occupational History   Occupation: Product/process development scientist  Tobacco Use   Smoking status: Former    Current packs/day: 0.00    Types: Cigarettes    Quit date: 2015    Years since quitting: 9.8    Passive exposure: Past   Smokeless tobacco: Never  Vaping Use   Vaping status: Never Used  Substance and Sexual Activity   Alcohol use: Not Currently   Drug use: No   Sexual activity: Not Currently    Birth control/protection: None  Other Topics Concern    Not on file  Social History Narrative   Not on file   Social Determinants of Health   Financial Resource Strain: Low Risk  (02/25/2023)   Overall Financial Resource Strain (CARDIA)    Difficulty of Paying Living Expenses: Not hard at all  Food Insecurity: No Food Insecurity (02/25/2023)   Hunger Vital Sign    Worried About Running Out of Food in the Last Year: Never true    Ran Out of Food in the Last Year: Never true  Transportation Needs: No Transportation Needs (02/25/2023)   PRAPARE - Administrator, Civil Service (Medical): No    Lack of Transportation (Non-Medical): No  Physical Activity: Insufficiently Active (02/25/2023)   Exercise Vital Sign    Days of Exercise per Week: 2 days    Minutes of Exercise per Session: 30 min  Stress: No Stress Concern Present (02/25/2023)   [employer] of Occupational Health - Occupational Stress Questionnaire    Feeling of Stress : Not at all  Social Connections: Moderately Integrated (02/25/2023)   Social Connection and Isolation Panel [NHANES]    Frequency of Communication with Friends and Family: More than three times a week    Frequency of Social Gatherings with Friends and Family: Three times a week    Attends Religious Services: 1 to 4 times per year    Active Member of Clubs or Organizations: No    Attends Engineer, structural: Not on file    Marital Status: Married  Catering manager Violence: Not on file    Family History  Problem Relation Age of Onset   Rheum arthritis Mother    Hypertension Mother    Rheum arthritis Sister     Past Surgical History:  Procedure Laterality Date   HERNIA REPAIR     TOTAL KNEE ARTHROPLASTY Right 07/30/2021   Procedure: TOTAL KNEE ARTHROPLASTY;  Surgeon: Durene Romans, MD;  Location: [facility];  Service: Orthopedics;  Laterality: Right;    ROS: Review of Systems Negative except as stated above  PHYSICAL EXAM: BP 120/82   Pulse 79   Temp 98.9 F (37.2 C) (Oral)   Ht 6'  (1.829 m)   Wt (!) 306 lb 12.8 oz (139.2 kg)   SpO2 91%   BMI 41.61 kg/m   Physical Exam HENT:     Head: Normocephalic and atraumatic.     Nose: Nose normal.     Mouth/Throat:     Mouth: Mucous membranes are moist.     Pharynx: Oropharynx is clear.  Eyes:     Extraocular Movements: Extraocular movements intact.     Conjunctiva/sclera: Conjunctivae normal.     Pupils: Pupils are equal, round, and reactive to light.  Cardiovascular:     Rate and Rhythm: Normal rate and regular rhythm.  Pulses: Normal pulses.     Heart sounds: Normal heart sounds.  Pulmonary:     Effort: Pulmonary effort is normal.     Breath sounds: Normal breath sounds.  Musculoskeletal:        General: Normal range of motion.     Cervical back: Normal range of motion and neck supple.  Neurological:     General: No focal deficit present.     Mental Status: He is alert and oriented to person, place, and time.  Psychiatric:        Mood and Affect: Mood normal.        Behavior: Behavior normal.      ASSESSMENT AND PLAN: 1. Primary hypertension - Continue Amlodipine and Valsartan as prescribed.  - Routine screening.  - Counseled on blood pressure goal of less than 130/80, low-sodium, DASH diet, medication compliance, and 150 minutes of moderate intensity exercise per week as tolerated. Counseled on medication adherence and adverse effects. - Follow-up with primary provider in 3 months or sooner if needed.  - Basic Metabolic Panel - amLODipine (NORVASC) 10 MG tablet; Take 1 tablet (10 mg total) by mouth daily.  Dispense: 90 tablet; Refill: 0 - valsartan (DIOVAN) 40 MG tablet; Take 1 tablet (40 mg) by mouth daily.  Dispense: 90 tablet; Refill: 0  2. Hyperlipidemia, unspecified hyperlipidemia type - Continue Atorvastatin as prescribed. Counseled on medication adherence/adverse effects.  - Routine screening.  - Follow-up with primary provider as scheduled.  - Lipid panel - atorvastatin (LIPITOR) 20 MG  tablet; Take 1 tablet (20 mg total) by mouth daily.  Dispense: 90 tablet; Refill: 0  3. Prediabetes - Routine screening.  - Hemoglobin A1c   Patient was given the opportunity to ask questions.  Patient verbalized understanding of the plan and was able to repeat key elements of the plan. Patient was given clear instructions to go to Emergency Department or return to medical center if symptoms don't improve, worsen, or new problems develop.The patient verbalized understanding.   Orders Placed This Encounter  Procedures   Basic Metabolic Panel   Lipid panel   Hemoglobin A1c     Requested Prescriptions   Signed Prescriptions Disp Refills   amLODipine (NORVASC) 10 MG tablet 90 tablet 0    Sig: Take 1 tablet (10 mg total) by mouth daily.   valsartan (DIOVAN) 40 MG tablet 90 tablet 0    Sig: Take 1 tablet (40 mg) by mouth daily.   atorvastatin (LIPITOR) 20 MG tablet 90 tablet 0    Sig: Take 1 tablet (20 mg total) by mouth daily.    Return in about 3 months (around 12/16/2023) for Follow-Up or next available chronic conditions.  Rema Fendt, NP

## 2023-09-16 ENCOUNTER — Ambulatory Visit: Payer: No Typology Code available for payment source | Admitting: Rehabilitative and Restorative Service Providers"

## 2023-09-16 ENCOUNTER — Other Ambulatory Visit (HOSPITAL_COMMUNITY): Payer: Self-pay

## 2023-09-16 ENCOUNTER — Encounter: Payer: Self-pay | Admitting: Rehabilitative and Restorative Service Providers"

## 2023-09-16 ENCOUNTER — Other Ambulatory Visit: Payer: Self-pay

## 2023-09-16 ENCOUNTER — Other Ambulatory Visit: Payer: Self-pay | Admitting: Family

## 2023-09-16 DIAGNOSIS — R293 Abnormal posture: Secondary | ICD-10-CM | POA: Diagnosis not present

## 2023-09-16 DIAGNOSIS — M542 Cervicalgia: Secondary | ICD-10-CM | POA: Diagnosis not present

## 2023-09-16 DIAGNOSIS — E785 Hyperlipidemia, unspecified: Secondary | ICD-10-CM

## 2023-09-16 LAB — LIPID PANEL
Chol/HDL Ratio: 5.4 ratio — ABNORMAL HIGH (ref 0.0–5.0)
Cholesterol, Total: 195 mg/dL (ref 100–199)
HDL: 36 mg/dL — ABNORMAL LOW (ref >39)
LDL Chol Calc (NIH): 114 mg/dL — ABNORMAL HIGH (ref 0–99)
Triglycerides: 256 mg/dL — ABNORMAL HIGH (ref 0–149)
VLDL Cholesterol Cal: 45 mg/dL — ABNORMAL HIGH (ref 5–40)

## 2023-09-16 LAB — HEMOGLOBIN A1C
Est. average glucose Bld gHb Est-mCnc: 137 mg/dL
Hgb A1c MFr Bld: 6.4 % — ABNORMAL HIGH (ref 4.8–5.6)

## 2023-09-16 LAB — BASIC METABOLIC PANEL
BUN/Creatinine Ratio: 14 (ref 9–20)
BUN: 8 mg/dL (ref 6–24)
CO2: 18 mmol/L — ABNORMAL LOW (ref 20–29)
Calcium: 9.6 mg/dL (ref 8.7–10.2)
Chloride: 103 mmol/L (ref 96–106)
Creatinine, Ser: 0.58 mg/dL — ABNORMAL LOW (ref 0.76–1.27)
Glucose: 72 mg/dL (ref 70–99)
Potassium: 4.1 mmol/L (ref 3.5–5.2)
Sodium: 139 mmol/L (ref 134–144)
eGFR: 114 mL/min/{1.73_m2} (ref >59)

## 2023-09-16 MED ORDER — ATORVASTATIN CALCIUM 40 MG PO TABS
40.0000 mg | ORAL_TABLET | Freq: Every day | ORAL | 0 refills | Status: DC
  Filled 2023-09-16 – 2023-10-07 (×2): qty 30, 30d supply, fill #0
  Filled 2023-10-27: qty 30, 30d supply, fill #1
  Filled 2023-11-30: qty 30, 30d supply, fill #2
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
## 2023-09-22 ENCOUNTER — Other Ambulatory Visit (HOSPITAL_COMMUNITY): Payer: Self-pay

## 2023-09-22 MED ORDER — HYDROCODONE-ACETAMINOPHEN 10-325 MG PO TABS
1.0000 | ORAL_TABLET | Freq: Three times a day (TID) | ORAL | 0 refills | Status: DC | PRN
  Filled 2023-09-22: qty 90, 30d supply, fill #0

## 2023-09-23 ENCOUNTER — Encounter: Payer: No Typology Code available for payment source | Admitting: Rehabilitative and Restorative Service Providers"

## 2023-09-23 ENCOUNTER — Other Ambulatory Visit (HOSPITAL_COMMUNITY): Payer: Self-pay

## 2023-09-23 ENCOUNTER — Telehealth: Payer: Self-pay | Admitting: Rehabilitative and Restorative Service Providers"

## 2023-09-23 NOTE — Telephone Encounter (Signed)
Called patient after 15 mins no show for appointment.  Pt indicated he was out of town.  Reminded him of the next appointment on Nov 6 at 8:45 am   Chyrel Masson, PT, DPT, OCS, ATC 09/23/23  10:31 AM

## 2023-09-24 ENCOUNTER — Ambulatory Visit: Payer: No Typology Code available for payment source | Admitting: Physician Assistant

## 2023-09-24 ENCOUNTER — Other Ambulatory Visit (HOSPITAL_COMMUNITY): Payer: Self-pay

## 2023-09-25 ENCOUNTER — Other Ambulatory Visit (HOSPITAL_COMMUNITY): Payer: Self-pay

## 2023-09-28 ENCOUNTER — Other Ambulatory Visit (HOSPITAL_COMMUNITY): Payer: Self-pay

## 2023-09-30 ENCOUNTER — Encounter: Payer: No Typology Code available for payment source | Admitting: Rehabilitative and Restorative Service Providers"

## 2023-09-30 ENCOUNTER — Telehealth: Payer: Self-pay | Admitting: Rehabilitative and Restorative Service Providers"

## 2023-09-30 NOTE — Telephone Encounter (Signed)
Pt indicated he was in the clinic downstairs and was told he didn't have an appointment so he left.  In discussion, I told patient that the check in downstairs was MD related and not PT related so they probably didn't see the PT visit in their checking.  Pt indicated doing well.  Discussed possible getting referral from his MD about knee to come back for that.   Chyrel Masson, PT, DPT, OCS, ATC 09/30/23  9:07 AM

## 2023-10-07 ENCOUNTER — Other Ambulatory Visit (HOSPITAL_COMMUNITY): Payer: Self-pay

## 2023-10-09 ENCOUNTER — Telehealth: Payer: Self-pay | Admitting: Family

## 2023-10-09 NOTE — Telephone Encounter (Signed)
Patient dropped off document  Prescriber Response Form , to be filled out by provider. Patient requested to send it back via Fax within 5-days. Document is located in providers tray at front office.Please fax at  [phone]

## 2023-10-15 ENCOUNTER — Telehealth: Payer: Self-pay | Admitting: Family

## 2023-10-15 ENCOUNTER — Other Ambulatory Visit (HOSPITAL_COMMUNITY): Payer: Self-pay

## 2023-10-15 MED ORDER — PREDNISONE 5 MG (21) PO TBPK
ORAL_TABLET | ORAL | 0 refills | Status: DC
  Filled 2023-10-15: qty 21, 6d supply, fill #0

## 2023-10-15 NOTE — Telephone Encounter (Signed)
Sent to [facility]. Please keep all scheduled appointments.

## 2023-10-27 ENCOUNTER — Other Ambulatory Visit (HOSPITAL_COMMUNITY): Payer: Self-pay

## 2023-10-27 ENCOUNTER — Other Ambulatory Visit: Payer: Self-pay | Admitting: Family

## 2023-10-27 DIAGNOSIS — K219 Gastro-esophageal reflux disease without esophagitis: Secondary | ICD-10-CM

## 2023-10-27 MED ORDER — HYDROCODONE-ACETAMINOPHEN 10-325 MG PO TABS
1.0000 | ORAL_TABLET | Freq: Three times a day (TID) | ORAL | 0 refills | Status: DC | PRN
  Filled 2023-10-27 – 2023-10-28 (×2): qty 90, 30d supply, fill #0

## 2023-10-28 ENCOUNTER — Other Ambulatory Visit (HOSPITAL_COMMUNITY): Payer: Self-pay

## 2023-10-28 MED ORDER — PANTOPRAZOLE SODIUM 40 MG PO TBEC
40.0000 mg | DELAYED_RELEASE_TABLET | Freq: Two times a day (BID) | ORAL | 0 refills | Status: DC
  Filled 2023-10-28: qty 180, 90d supply, fill #0
  Filled 2023-11-30: qty 60, 30d supply, fill #0

## 2023-10-28 NOTE — Telephone Encounter (Signed)
Complete

## 2023-10-30 ENCOUNTER — Other Ambulatory Visit: Payer: Self-pay

## 2023-11-02 ENCOUNTER — Other Ambulatory Visit (HOSPITAL_COMMUNITY): Payer: Self-pay

## 2023-11-27 ENCOUNTER — Other Ambulatory Visit (HOSPITAL_COMMUNITY): Payer: Self-pay

## 2023-11-27 ENCOUNTER — Other Ambulatory Visit (HOSPITAL_BASED_OUTPATIENT_CLINIC_OR_DEPARTMENT_OTHER): Payer: Self-pay

## 2023-11-27 MED ORDER — HYDROCODONE-ACETAMINOPHEN 10-325 MG PO TABS
1.0000 | ORAL_TABLET | Freq: Three times a day (TID) | ORAL | 0 refills | Status: DC | PRN
  Filled 2023-11-27: qty 90, 30d supply, fill #0

## 2023-11-28 ENCOUNTER — Other Ambulatory Visit (HOSPITAL_COMMUNITY): Payer: Self-pay

## 2023-11-30 ENCOUNTER — Other Ambulatory Visit (HOSPITAL_COMMUNITY): Payer: Self-pay

## 2023-12-01 ENCOUNTER — Other Ambulatory Visit (HOSPITAL_COMMUNITY): Payer: Self-pay

## 2023-12-16 ENCOUNTER — Encounter: Payer: No Typology Code available for payment source | Admitting: Family

## 2023-12-16 ENCOUNTER — Telehealth: Payer: Self-pay | Admitting: Family

## 2023-12-16 ENCOUNTER — Encounter: Payer: Self-pay | Admitting: Family

## 2023-12-16 NOTE — Progress Notes (Signed)
 Erroneous encounter-disregard

## 2023-12-16 NOTE — Telephone Encounter (Signed)
Called patient to reschedule missed appointment no answer left voicemail

## 2023-12-28 ENCOUNTER — Other Ambulatory Visit (HOSPITAL_COMMUNITY): Payer: Self-pay

## 2023-12-28 MED ORDER — HYDROCODONE-ACETAMINOPHEN 10-325 MG PO TABS
1.0000 | ORAL_TABLET | Freq: Three times a day (TID) | ORAL | 0 refills | Status: DC
  Filled 2023-12-28: qty 90, 30d supply, fill #0

## 2023-12-29 ENCOUNTER — Other Ambulatory Visit (HOSPITAL_COMMUNITY): Payer: Self-pay

## 2023-12-29 MED ORDER — CLOTRIMAZOLE-BETAMETHASONE 1-0.05 % EX CREA
1.0000 | TOPICAL_CREAM | Freq: Two times a day (BID) | CUTANEOUS | 1 refills | Status: DC
  Filled 2023-12-29: qty 45, 23d supply, fill #0
  Filled 2024-02-07: qty 45, 23d supply, fill #1

## 2023-12-29 MED ORDER — AZITHROMYCIN 250 MG PO TABS
ORAL_TABLET | ORAL | 0 refills | Status: DC
  Filled 2023-12-29: qty 2, 1d supply, fill #0

## 2023-12-30 ENCOUNTER — Other Ambulatory Visit: Payer: Self-pay

## 2023-12-30 ENCOUNTER — Other Ambulatory Visit (HOSPITAL_COMMUNITY): Payer: Self-pay

## 2023-12-30 MED ORDER — TRIAMCINOLONE ACETONIDE 0.1 % EX CREA
1.0000 | TOPICAL_CREAM | CUTANEOUS | 2 refills | Status: AC
  Filled 2023-12-30: qty 45, 30d supply, fill #0
  Filled 2023-12-30: qty 30, 30d supply, fill #0
  Filled 2024-02-07: qty 30, 30d supply, fill #1

## 2023-12-30 MED ORDER — VALACYCLOVIR HCL 1 G PO TABS
3000.0000 mg | ORAL_TABLET | Freq: Every day | ORAL | 0 refills | Status: DC
  Filled 2023-12-30: qty 21, 7d supply, fill #0

## 2023-12-30 MED ORDER — PREDNISONE 20 MG PO TABS
20.0000 mg | ORAL_TABLET | Freq: Every day | ORAL | 0 refills | Status: DC
  Filled 2023-12-30: qty 5, 5d supply, fill #0

## 2024-01-01 ENCOUNTER — Other Ambulatory Visit: Payer: Self-pay

## 2024-01-01 ENCOUNTER — Other Ambulatory Visit (HOSPITAL_COMMUNITY): Payer: Self-pay

## 2024-01-01 ENCOUNTER — Telehealth: Payer: Self-pay | Admitting: Family

## 2024-01-01 DIAGNOSIS — K219 Gastro-esophageal reflux disease without esophagitis: Secondary | ICD-10-CM

## 2024-01-01 DIAGNOSIS — I10 Essential (primary) hypertension: Secondary | ICD-10-CM

## 2024-01-01 DIAGNOSIS — E785 Hyperlipidemia, unspecified: Secondary | ICD-10-CM

## 2024-01-01 MED ORDER — ATORVASTATIN CALCIUM 40 MG PO TABS
40.0000 mg | ORAL_TABLET | Freq: Every day | ORAL | 0 refills | Status: DC
  Filled 2024-01-01: qty 30, 30d supply, fill #0

## 2024-01-01 MED ORDER — PANTOPRAZOLE SODIUM 40 MG PO TBEC
40.0000 mg | DELAYED_RELEASE_TABLET | Freq: Two times a day (BID) | ORAL | 0 refills | Status: DC
  Filled 2024-01-01: qty 60, 30d supply, fill #0
  Filled 2024-02-07: qty 60, 30d supply, fill #1
  Filled 2024-05-25 – 2024-06-22 (×2): qty 60, 30d supply, fill #2

## 2024-01-01 MED ORDER — VALSARTAN 40 MG PO TABS
40.0000 mg | ORAL_TABLET | Freq: Every day | ORAL | 0 refills | Status: DC
  Filled 2024-01-01: qty 30, 30d supply, fill #0
  Filled 2024-05-13: qty 30, 30d supply, fill #1

## 2024-01-01 MED ORDER — AMLODIPINE BESYLATE 10 MG PO TABS
10.0000 mg | ORAL_TABLET | Freq: Every day | ORAL | 0 refills | Status: DC
  Filled 2024-01-01: qty 30, 30d supply, fill #0

## 2024-01-01 NOTE — Telephone Encounter (Signed)
 Patient needs Refill for amLODipine ,atorvastatin , valsartin,pantoprazole , patient has appointment on 01/14/2024

## 2024-01-01 NOTE — Telephone Encounter (Signed)
 Amlodipine , Atorvastatin , Valsartan , and Pantoprazole  prescribed 01/01/2024 for 90 day supply.

## 2024-01-01 NOTE — Telephone Encounter (Signed)
 Refills sent in

## 2024-01-06 ENCOUNTER — Ambulatory Visit: Payer: No Typology Code available for payment source | Admitting: Dermatology

## 2024-01-12 ENCOUNTER — Ambulatory Visit: Payer: Medicaid Other | Admitting: Dermatology

## 2024-01-14 ENCOUNTER — Other Ambulatory Visit: Payer: Self-pay

## 2024-01-14 ENCOUNTER — Other Ambulatory Visit (HOSPITAL_COMMUNITY): Payer: Self-pay

## 2024-01-14 ENCOUNTER — Ambulatory Visit (INDEPENDENT_AMBULATORY_CARE_PROVIDER_SITE_OTHER): Payer: No Typology Code available for payment source | Admitting: Family

## 2024-01-14 VITALS — BP 141/92 | HR 88 | Temp 98.1°F | Resp 16 | Wt 298.0 lb

## 2024-01-14 DIAGNOSIS — E785 Hyperlipidemia, unspecified: Secondary | ICD-10-CM

## 2024-01-14 DIAGNOSIS — I10 Essential (primary) hypertension: Secondary | ICD-10-CM | POA: Diagnosis not present

## 2024-01-14 DIAGNOSIS — Z1321 Encounter for screening for nutritional disorder: Secondary | ICD-10-CM

## 2024-01-14 DIAGNOSIS — N529 Male erectile dysfunction, unspecified: Secondary | ICD-10-CM | POA: Diagnosis not present

## 2024-01-14 MED ORDER — VALSARTAN 80 MG PO TABS
80.0000 mg | ORAL_TABLET | Freq: Every day | ORAL | 0 refills | Status: DC
  Filled 2024-01-14: qty 30, 30d supply, fill #0
  Filled 2024-02-07: qty 30, 30d supply, fill #1
  Filled 2024-03-03: qty 30, 30d supply, fill #2

## 2024-01-14 MED ORDER — AMLODIPINE BESYLATE 10 MG PO TABS
10.0000 mg | ORAL_TABLET | Freq: Every day | ORAL | 0 refills | Status: DC
  Filled 2024-01-14: qty 90, 90d supply, fill #0
  Filled 2024-02-07: qty 30, 30d supply, fill #0
  Filled 2024-03-03: qty 30, 30d supply, fill #1
  Filled 2024-04-07: qty 30, 30d supply, fill #2

## 2024-01-14 MED ORDER — SILDENAFIL CITRATE 50 MG PO TABS
ORAL_TABLET | ORAL | 1 refills | Status: DC
  Filled 2024-01-14: qty 8, 25d supply, fill #0
  Filled 2024-02-07: qty 8, 25d supply, fill #1
  Filled 2024-04-27: qty 8, 25d supply, fill #2
  Filled 2024-05-25: qty 8, 25d supply, fill #3

## 2024-01-14 MED ORDER — ATORVASTATIN CALCIUM 40 MG PO TABS
40.0000 mg | ORAL_TABLET | Freq: Every day | ORAL | 0 refills | Status: DC
  Filled 2024-01-14: qty 90, 90d supply, fill #0
  Filled 2024-02-07: qty 30, 30d supply, fill #0
  Filled 2024-05-25 – 2024-06-22 (×2): qty 30, 30d supply, fill #1

## 2024-01-14 NOTE — Progress Notes (Unsigned)
 Patient is here for their 3 month follow-up Patient has no concerns today Care gaps have been discussed with patient

## 2024-01-14 NOTE — Progress Notes (Unsigned)
 Patient ID: Darrell Conway, male    DOB: 01/27/1966  MRN: [id number]  CC: Medical Management of Chronic Issues   Subjective: Darrell Conway is a 58 y.o. male who presents for His concerns today include: ***  Patient Active Problem List   Diagnosis Date Noted   Neck pain 09/03/2023   Hypertensive disorder 05/23/2022   Gout 05/23/2022   Osteoarthritis of left knee 01/19/2022   Chronic pain of left ankle 12/17/2021   S/P total knee arthroplasty, right 07/30/2021   History of total knee arthroplasty 07/30/2021   Essential hypertension 01/03/2021   Osteoarthritis of right knee 11/09/2020   Pain in joint of right knee 11/09/2020   Low back pain 10/15/2020   Irregular heart beat 02/14/2020   Personal history of COVID-19 02/13/2020   Amnesia 02/13/2020   Poor concentration 02/13/2020   Muscle pain 02/13/2020   Unilateral primary osteoarthritis, left hip 01/31/2020   Osteoarthritis of left hip 01/31/2020   Morbid obesity (HCC) 06/29/2019   Primary osteoarthritis of both knees 05/05/2019   Primary osteoarthritis of both hips    Multiple joint pain 04/26/2019   Pain in left foot 04/26/2019   Pain of both hip joints 04/26/2019   Obstructive sleep apnea of adult 04/11/2019   Laryngopharyngeal reflux 04/11/2019     Current Outpatient Medications on File Prior to Visit  Medication Sig Dispense Refill   gabapentin (NEURONTIN) 300 MG capsule Take by mouth.     HYDROcodone-acetaminophen (NORCO/VICODIN) 5-325 MG tablet Take by mouth.     lisinopril (ZESTRIL) 20 MG tablet Take by mouth.     acetaminophen (TYLENOL) 325 MG tablet Take 1-2 tablets (325-650 mg total) by mouth every 6 (six) hours as needed for mild pain (pain score 1-3 or temp > 100.5).     amLODipine (NORVASC) 10 MG tablet Take 1 tablet (10 mg total) by mouth daily. 90 tablet 0   atorvastatin (LIPITOR) 40 MG tablet Take 1 tablet (40 mg total) by mouth daily. 90 tablet 0   azithromycin (ZITHROMAX) 250 MG tablet Take 2  tablets (500 mg total) by mouth daily for 1 day. if not better, follow up with your doctor 2 tablet 0   clotrimazole-betamethasone (LOTRISONE) cream Use on affected area 2 times daily until resolution and then 1 more week 45 g 1   colchicine 0.6 MG tablet Take 1 tablet (0.6 mg total) by mouth daily. 90 tablet 3   cyclobenzaprine (FLEXERIL) 10 MG tablet Take 1 tablet (10 mg total) by mouth every 8 (eight) hours as needed for up to 10 days. 15 tablet 0   EPINEPHrine 0.3 mg/0.3 mL IJ SOAJ injection Inject 0.3 mg into the muscle as needed for anaphylaxis. 2 each 2   HYDROcodone-acetaminophen (NORCO) 10-325 MG tablet Take 1 tablet by mouth 3 (three) times daily as needed. (Patient not taking: Reported on 01/15/2023) 90 tablet 0   HYDROcodone-acetaminophen (NORCO) 10-325 MG tablet Take 1 tablet by mouth 3 (three) times daily as needed. 90 tablet 0   HYDROcodone-acetaminophen (NORCO) 10-325 MG tablet Take 1 tablet by mouth 3 (three) times daily as needed. 90 tablet 0   HYDROcodone-acetaminophen (NORCO) 10-325 MG tablet Take 1 tablet by mouth 3 (three) times daily as needed. 90 tablet 0   HYDROcodone-acetaminophen (NORCO) 10-325 MG tablet Take 1 Tablet by mouth three times daily as needed 90 tablet 0   meloxicam (MOBIC) 15 MG tablet Take 1 tablet (15 mg total) by mouth daily. 15 tablet 0   Multiple Vitamin (MULTIVITAMIN)  capsule Take 1 capsule by mouth daily.     pantoprazole (PROTONIX) 40 MG tablet Take 1 tablet (40 mg total) by mouth 2 (two) times daily before a meal. 180 tablet 0   predniSONE (DELTASONE) 20 MG tablet Take 1 tablet (20 mg total) by mouth daily for 5 days 5 tablet 0   predniSONE (STERAPRED UNI-PAK 21 TAB) 5 MG (21) TBPK tablet Take according to package instructions. 21 tablet 0   triamcinolone cream (KENALOG) 0.1 % Apply 1 Application topically 2 (two) times a week for 30 days 45 g 2   valACYclovir (VALTREX) 1000 MG tablet Take 3 tablets (3,000 mg total) by mouth daily for 7 days 21 tablet 0    valsartan (DIOVAN) 40 MG tablet Take 1 tablet (40 mg) by mouth daily. 90 tablet 0   [DISCONTINUED] fluticasone (FLONASE) 50 MCG/ACT nasal spray Place 2 sprays into both nostrils daily. 16 g 6   [DISCONTINUED] orlistat (ALLI) 60 MG capsule Take 1 capsule (60 mg total) by mouth 3 (three) times daily with meals. 90 capsule 1   No current facility-administered medications on file prior to visit.    Allergies  Allergen Reactions   Lisinopril Swelling and Rash    Still taking the medication at home.  Pt reports getting welts on skin around sides/stomach and elsewhere.  He also reports mild lip swell.    Social History   Socioeconomic History   Marital status: Married    Spouse name: Not on file   Number of children: Not on file   Years of education: Not on file   Highest education level: Some college, no degree  Occupational History   Occupation: Product/process development scientist  Tobacco Use   Smoking status: Former    Current packs/day: 0.00    Types: Cigarettes    Quit date: 2015    Years since quitting: 10.1    Passive exposure: Past   Smokeless tobacco: Never  Vaping Use   Vaping status: Never Used  Substance and Sexual Activity   Alcohol use: Not Currently   Drug use: No   Sexual activity: Not Currently    Birth control/protection: None  Other Topics Concern   Not on file  Social History Narrative   Not on file   Social Drivers of Health   Financial Resource Strain: Low Risk  (01/14/2024)   Overall Financial Resource Strain (CARDIA)    Difficulty of Paying Living Expenses: Not hard at all  Food Insecurity: No Food Insecurity (01/14/2024)   Hunger Vital Sign    Worried About Running Out of Food in the Last Year: Never true    Ran Out of Food in the Last Year: Never true  Transportation Needs: No Transportation Needs (01/14/2024)   PRAPARE - Administrator, Civil Service (Medical): No    Lack of Transportation (Non-Medical): No  Physical Activity: Insufficiently  Active (01/14/2024)   Exercise Vital Sign    Days of Exercise per Week: 3 days    Minutes of Exercise per Session: 30 min  Stress: No Stress Concern Present (01/14/2024)   [employer] of Occupational Health - Occupational Stress Questionnaire    Feeling of Stress : Not at all  Social Connections: Moderately Integrated (01/14/2024)   Social Connection and Isolation Panel [NHANES]    Frequency of Communication with Friends and Family: More than three times a week    Frequency of Social Gatherings with Friends and Family: More than three times a week    Attends  Religious Services: More than 4 times per year    Active Member of Clubs or Organizations: No    Attends Banker Meetings: Not on file    Marital Status: Married  Catering manager Violence: Not on file    Family History  Problem Relation Age of Onset   Rheum arthritis Mother    Hypertension Mother    Rheum arthritis Sister     Past Surgical History:  Procedure Laterality Date   HERNIA REPAIR     TOTAL KNEE ARTHROPLASTY Right 07/30/2021   Procedure: TOTAL KNEE ARTHROPLASTY;  Surgeon: Durene Romans, MD;  Location: [facility];  Service: Orthopedics;  Laterality: Right;    ROS: Review of Systems Negative except as stated above  PHYSICAL EXAM: There were no vitals taken for this visit.  Physical Exam  {male adult master:310786} {male adult master:310785}     Latest Ref Rng & Units 09/15/2023   11:09 AM 12/14/2022    4:42 AM 12/02/2022   11:30 AM  CMP  Glucose 70 - 99 mg/dL 72  161    BUN 6 - 24 mg/dL 8  9    Creatinine 0.96 - 1.27 mg/dL 0.45  4.09    Sodium 811 - 144 mmol/L 139  135    Potassium 3.5 - 5.2 mmol/L 4.1  4.3    Chloride 96 - 106 mmol/L 103  104    CO2 20 - 29 mmol/L 18  23    Calcium 8.7 - 10.2 mg/dL 9.6  9.2    Total Protein 6.0 - 8.5 g/dL   7.6   Total Bilirubin 0.0 - 1.2 mg/dL   0.7   Alkaline Phos 44 - 121 IU/L   120   AST 0 - 40 IU/L   36   ALT 0 - 44 IU/L   45    Lipid Panel      Component Value Date/Time   CHOL 195 09/15/2023 1109   TRIG 256 (H) 09/15/2023 1109   HDL 36 (L) 09/15/2023 1109   CHOLHDL 5.4 (H) 09/15/2023 1109   LDLCALC 114 (H) 09/15/2023 1109   LDLDIRECT 193 (H) 10/02/2020 1444    CBC    Component Value Date/Time   WBC 12.7 (H) 12/14/2022 0442   RBC 5.32 12/14/2022 0442   HGB 15.3 12/14/2022 0442   HGB 15.4 12/02/2022 1130   HCT 45.1 12/14/2022 0442   HCT 45.5 12/02/2022 1130   PLT 211 12/14/2022 0442   PLT 271 12/02/2022 1130   MCV 84.8 12/14/2022 0442   MCV 85 12/02/2022 1130   MCH 28.8 12/14/2022 0442   MCHC 33.9 12/14/2022 0442   RDW 13.7 12/14/2022 0442   RDW 13.2 12/02/2022 1130   LYMPHSABS 1.3 12/14/2022 0442   LYMPHSABS 1.9 12/02/2022 1130   MONOABS 1.2 (H) 12/14/2022 0442   EOSABS 0.2 12/14/2022 0442   EOSABS 0.2 12/02/2022 1130   BASOSABS 0.1 12/14/2022 0442   BASOSABS 0.1 12/02/2022 1130    ASSESSMENT AND PLAN:  There are no diagnoses linked to this encounter.   Patient was given the opportunity to ask questions.  Patient verbalized understanding of the plan and was able to repeat key elements of the plan. Patient was given clear instructions to go to Emergency Department or return to medical center if symptoms don't improve, worsen, or new problems develop.The patient verbalized understanding.   No orders of the defined types were placed in this encounter.    Requested Prescriptions    No prescriptions requested or  ordered in this encounter    No follow-ups on file.  Rema Fendt, NP

## 2024-01-15 ENCOUNTER — Encounter: Payer: Self-pay | Admitting: Family

## 2024-01-15 ENCOUNTER — Other Ambulatory Visit: Payer: Self-pay | Admitting: Family

## 2024-01-15 DIAGNOSIS — Z13228 Encounter for screening for other metabolic disorders: Secondary | ICD-10-CM

## 2024-01-15 LAB — BASIC METABOLIC PANEL
BUN/Creatinine Ratio: 13 (ref 9–20)
BUN: 9 mg/dL (ref 6–24)
CO2: 23 mmol/L (ref 20–29)
Calcium: 9.8 mg/dL (ref 8.7–10.2)
Chloride: 100 mmol/L (ref 96–106)
Creatinine, Ser: 0.72 mg/dL — ABNORMAL LOW (ref 0.76–1.27)
Glucose: 115 mg/dL — ABNORMAL HIGH (ref 70–99)
Potassium: 6.1 mmol/L — ABNORMAL HIGH (ref 3.5–5.2)
Sodium: 138 mmol/L (ref 134–144)
eGFR: 107 mL/min/{1.73_m2} (ref >59)

## 2024-01-15 LAB — LIPID PANEL
Chol/HDL Ratio: 4.2 {ratio} (ref 0.0–5.0)
Cholesterol, Total: 161 mg/dL (ref 100–199)
HDL: 38 mg/dL — ABNORMAL LOW (ref >39)
LDL Chol Calc (NIH): 96 mg/dL (ref 0–99)
Triglycerides: 151 mg/dL — ABNORMAL HIGH (ref 0–149)
VLDL Cholesterol Cal: 27 mg/dL (ref 5–40)

## 2024-01-15 LAB — VITAMIN D 25 HYDROXY (VIT D DEFICIENCY, FRACTURES): Vit D, 25-Hydroxy: 34.1 ng/mL (ref 30.0–100.0)

## 2024-01-18 IMAGING — DX DG SACRUM/COCCYX 2+V
4 series · 4 of 4 positions shown · non-contrast
Comparison: None.

CLINICAL DATA: Fall.  Pain.

EXAM:
SACRUM AND COCCYX - 2+ VIEW

[sacrum 20° caudo-cranial ap (1 of 2)]
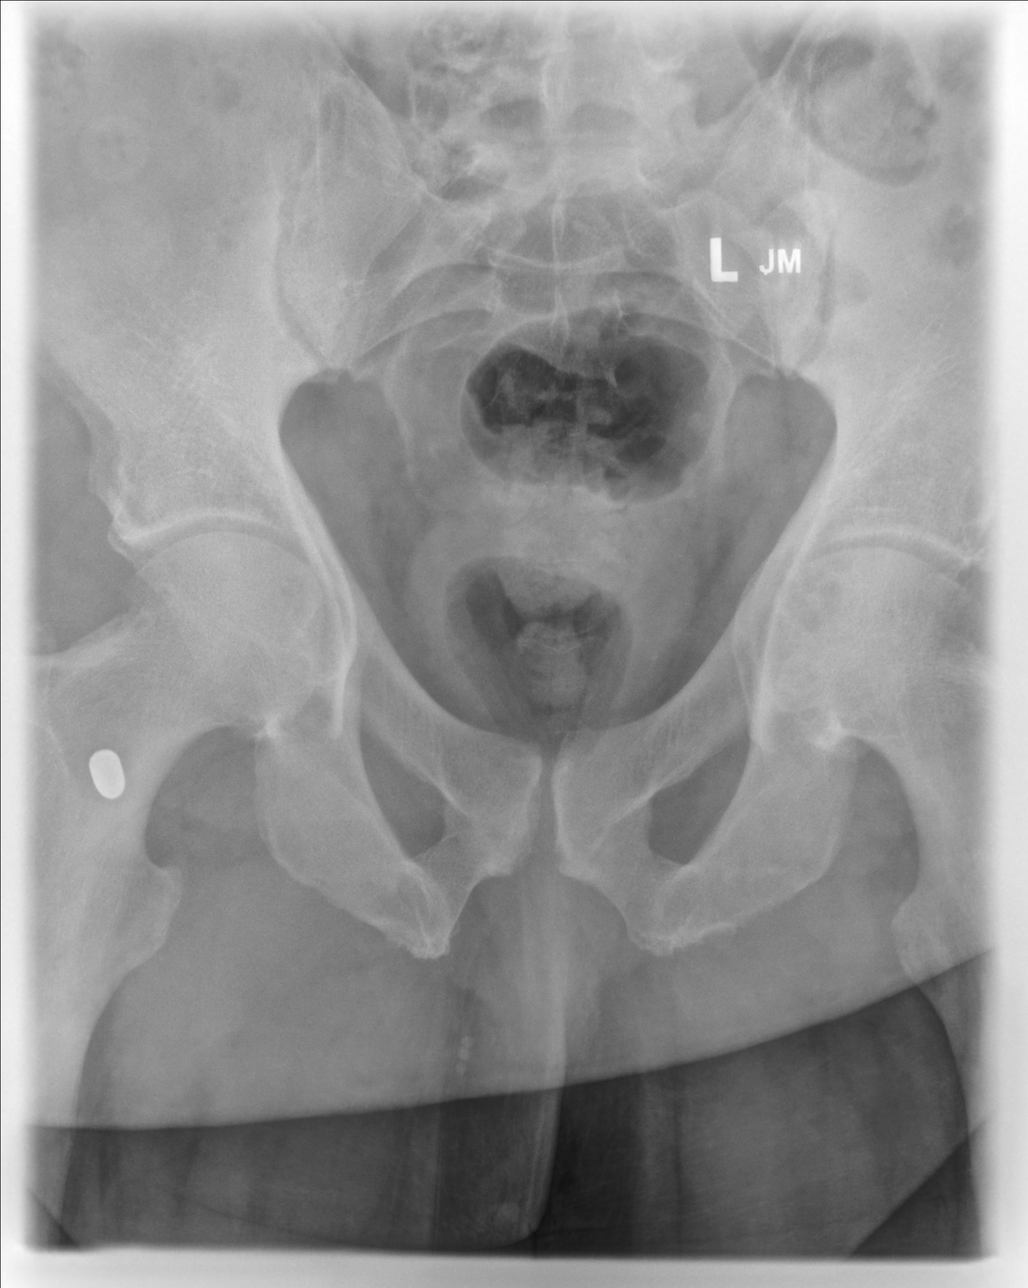

[sacrum 20° caudo-cranial ap (2 of 2)]
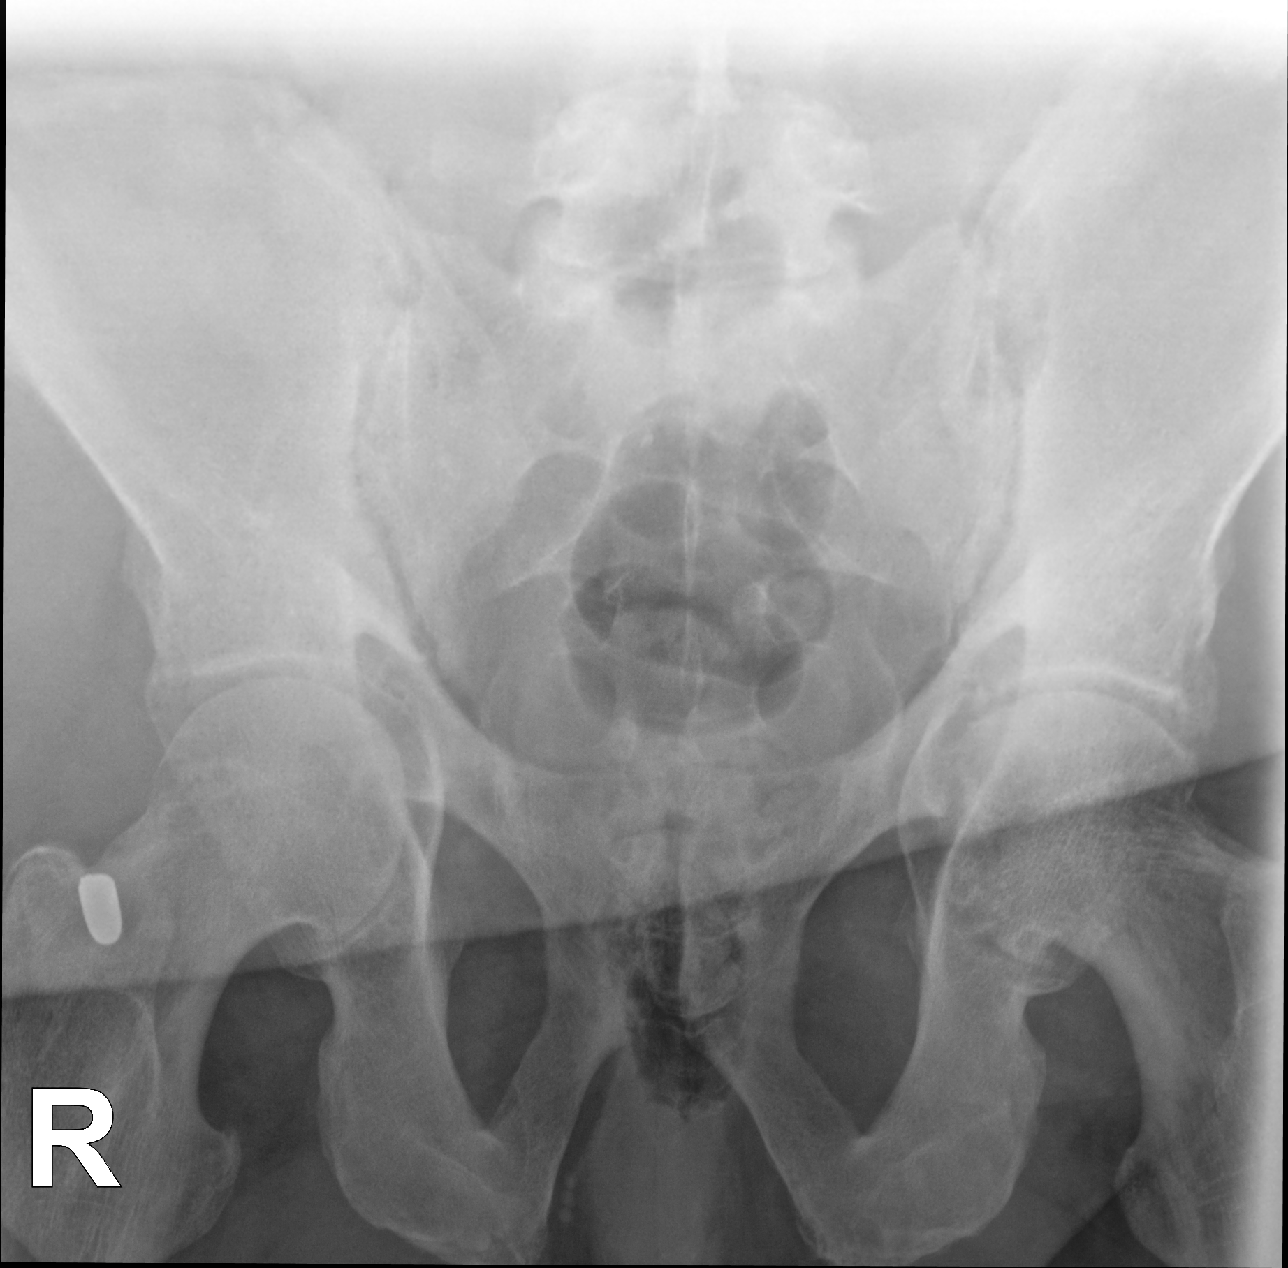

[coccyx 20° cranio-caudal ap]
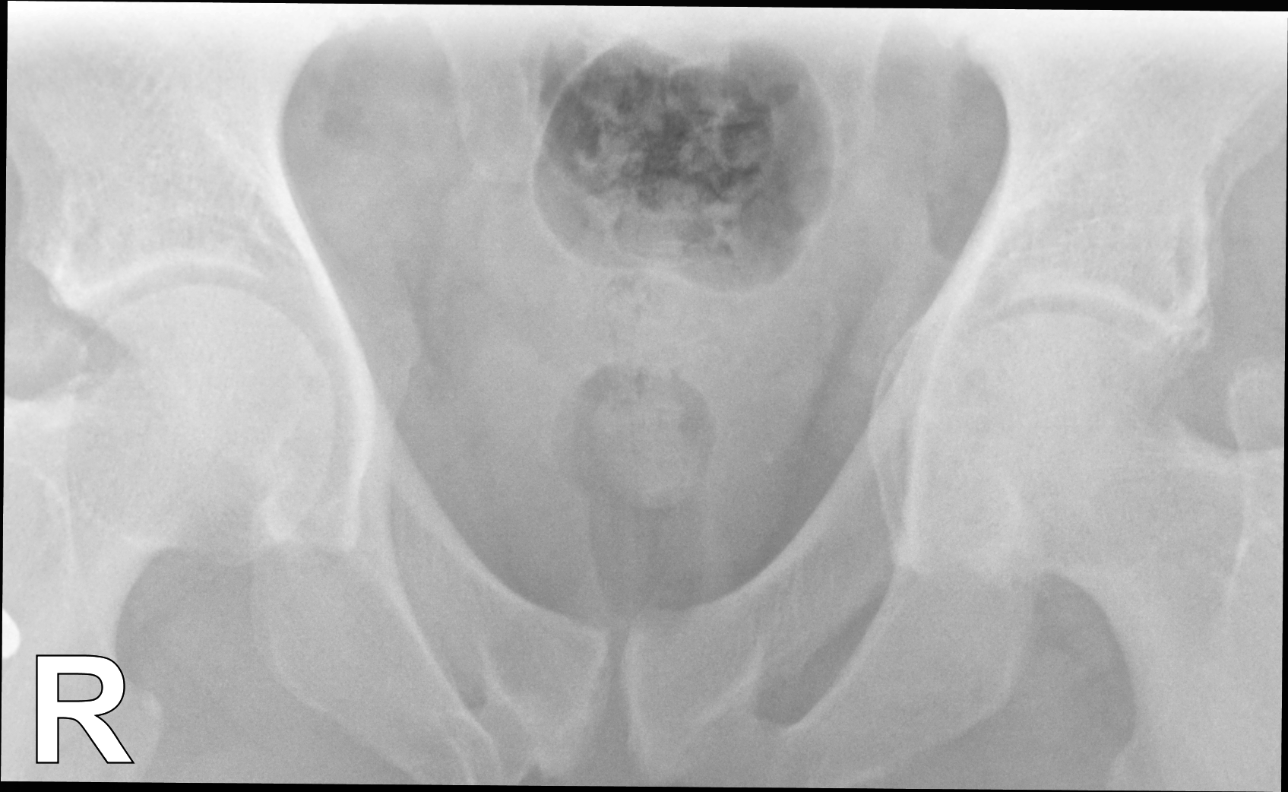

[sacrum lat]
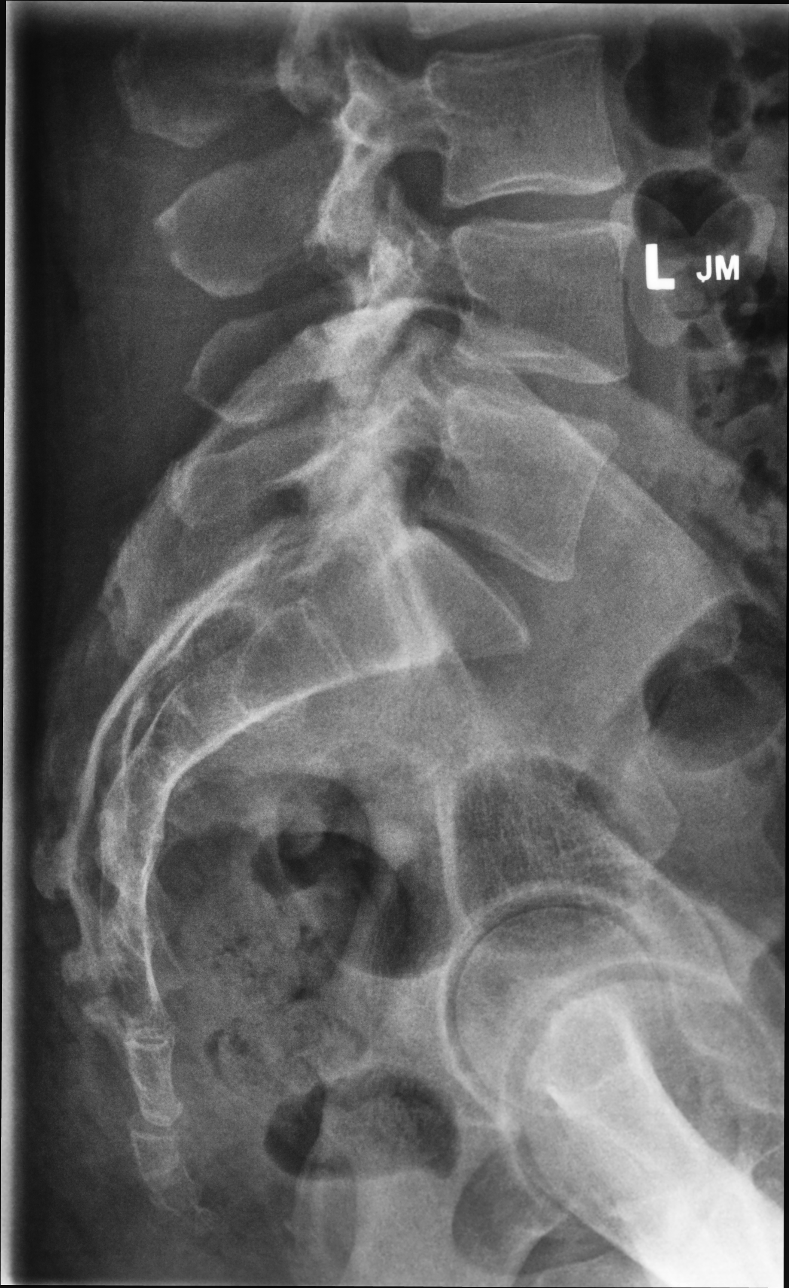

[4 of 4 positions shown; findings below may reference images not displayed]

FINDINGS: There is no evidence of fracture or other focal bone lesions.
IMPRESSION: Negative.

## 2024-01-18 IMAGING — DX DG LUMBAR SPINE COMPLETE 4+V
5 series · 5 of 5 positions shown · non-contrast
Comparison: None.

CLINICAL DATA: Lower back pain status post fall.

EXAM:
LUMBAR SPINE - COMPLETE 4+ VIEW

[lumbar spine ap]
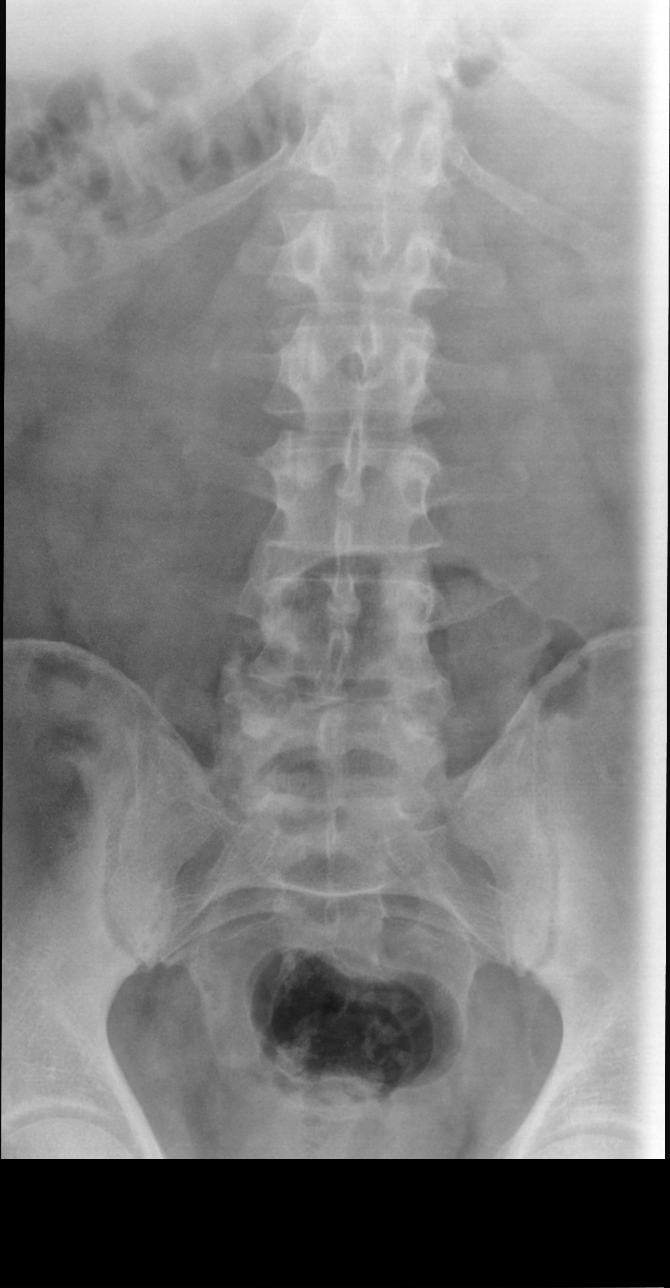

[lumbar spine oblique supine (1 of 2)]
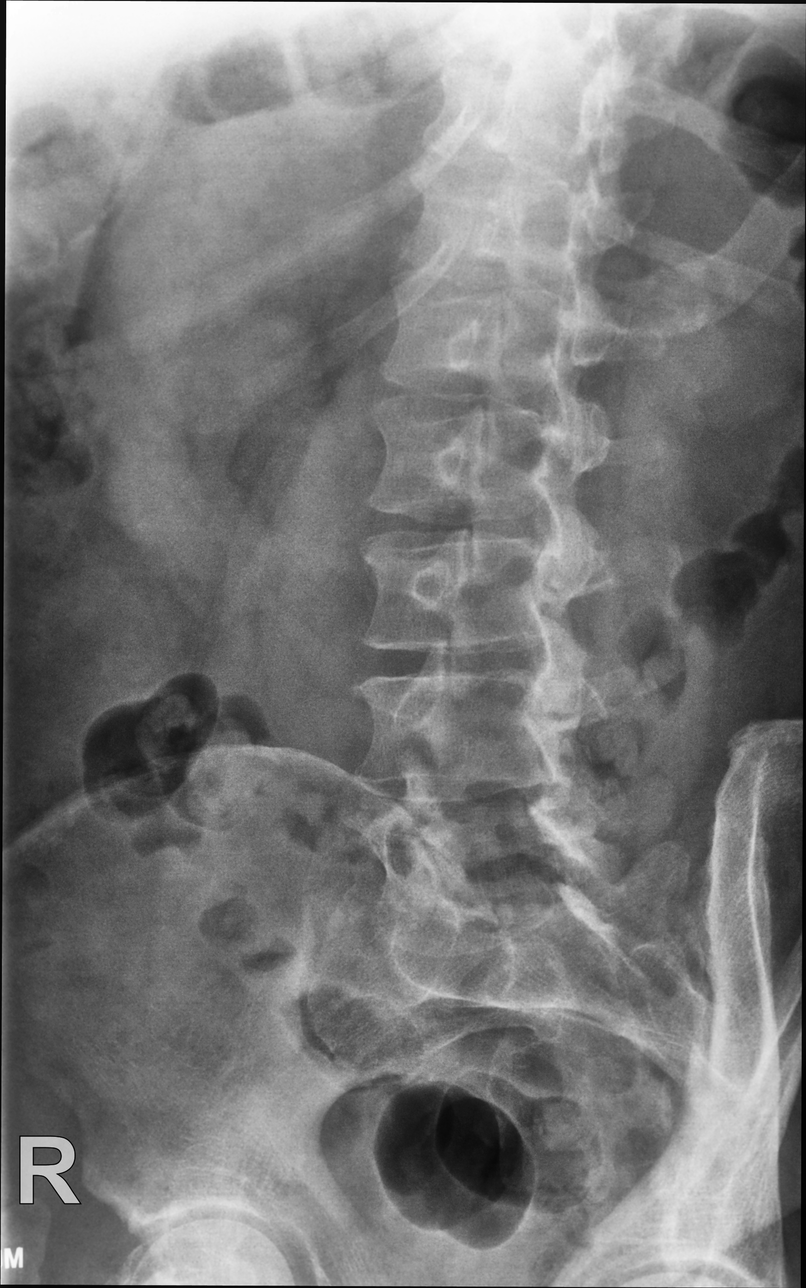

[lumbar spine oblique supine (2 of 2)]
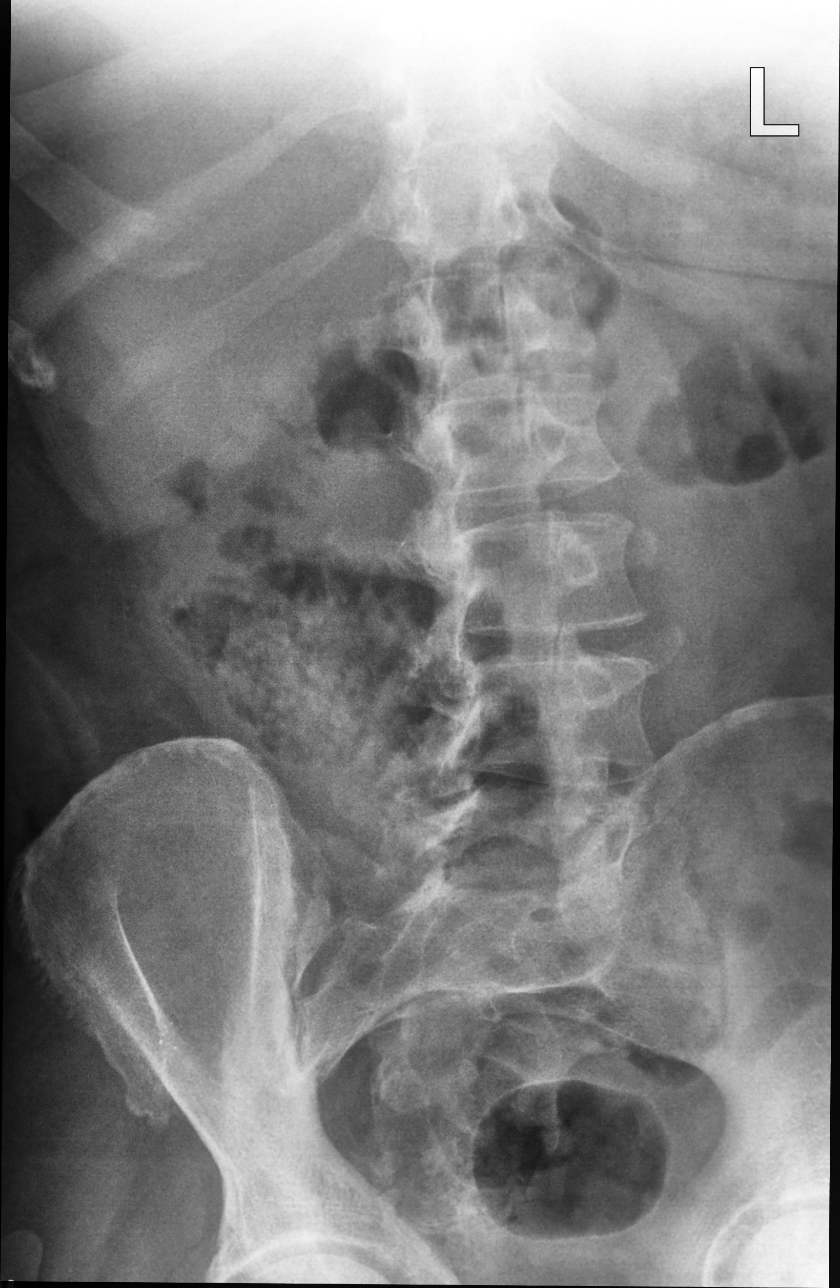

[lumbar spine lat]
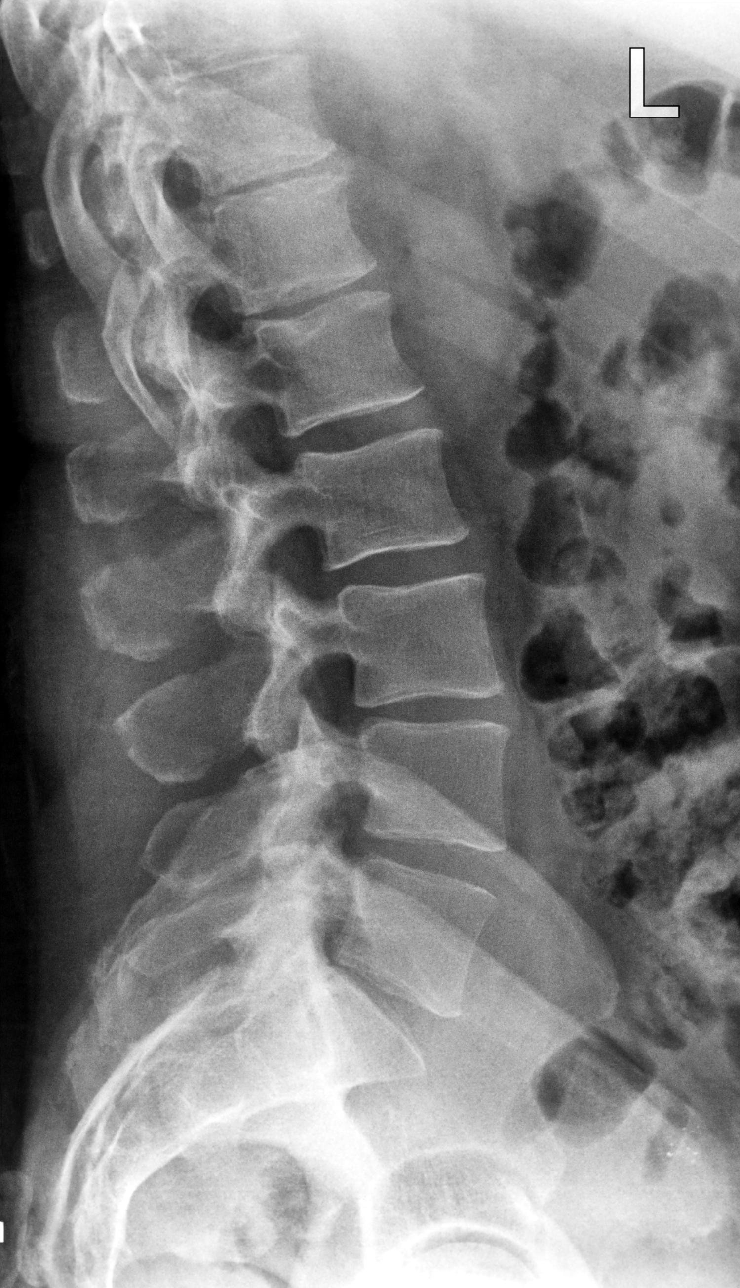

[lumbar spine l5-s1]
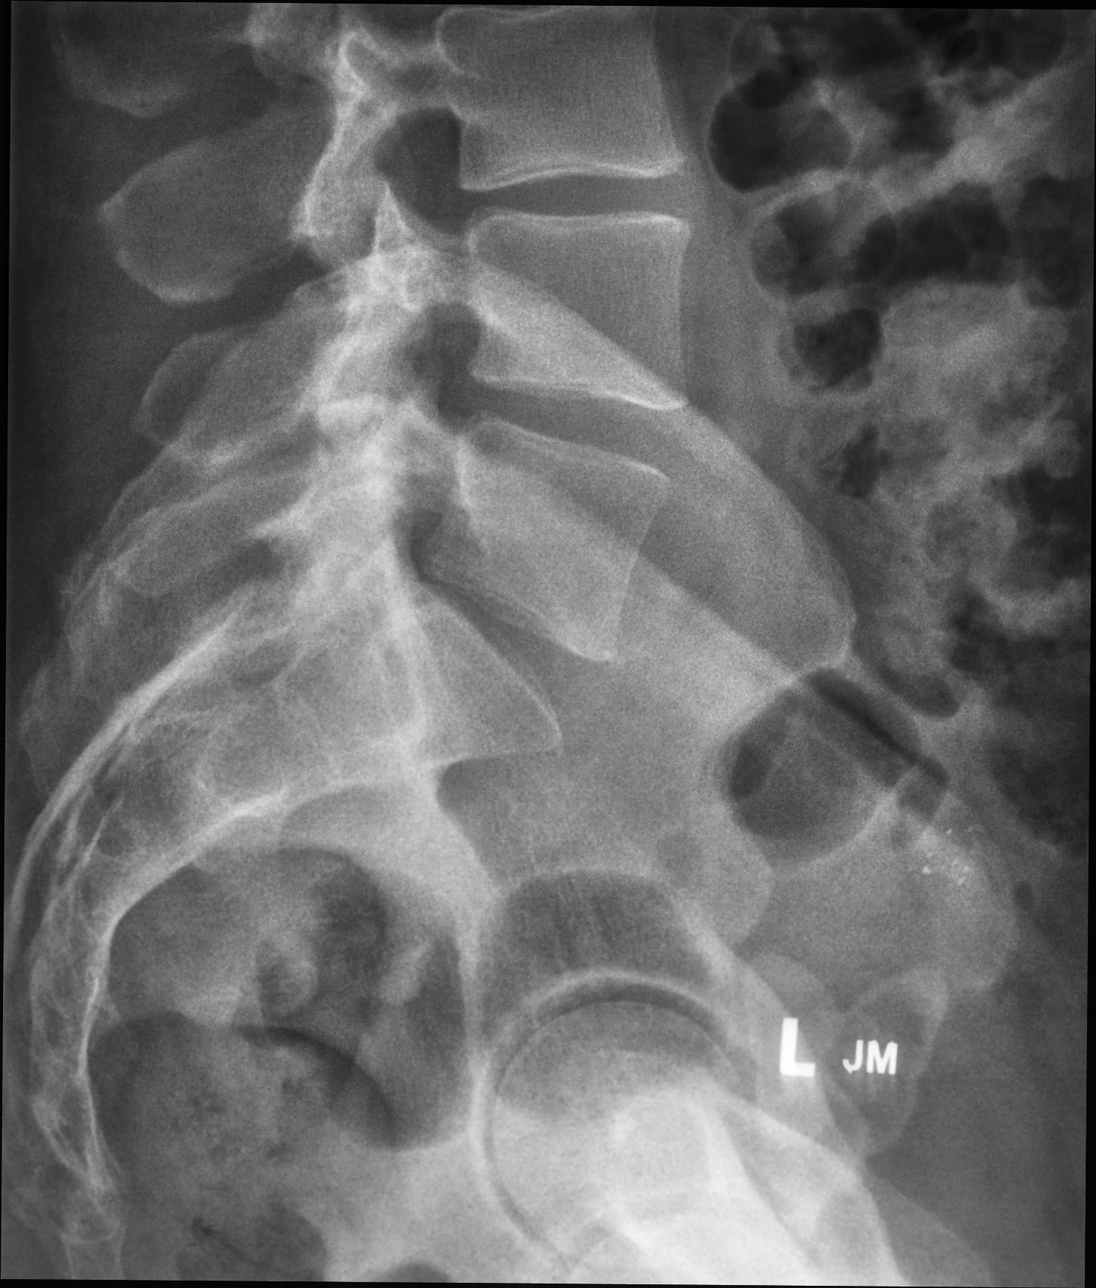

[5 of 5 positions shown; findings below may reference images not displayed]

FINDINGS: Normal anatomic alignment. No evidence for acute fracture or
dislocation. Relative preservation of the vertebral body and
intervertebral disc space heights. SI joints are unremarkable.
IMPRESSION: No acute osseous abnormality.

## 2024-01-21 ENCOUNTER — Other Ambulatory Visit (HOSPITAL_COMMUNITY): Payer: Self-pay

## 2024-01-25 ENCOUNTER — Other Ambulatory Visit (HOSPITAL_COMMUNITY): Payer: Self-pay

## 2024-01-25 MED ORDER — HYDROCODONE-ACETAMINOPHEN 10-325 MG PO TABS
1.0000 | ORAL_TABLET | Freq: Three times a day (TID) | ORAL | 0 refills | Status: DC | PRN
  Filled 2024-01-25: qty 90, 30d supply, fill #0

## 2024-01-25 MED ORDER — NALOXONE HCL 4 MG/0.1ML NA LIQD
Freq: Every day | NASAL | 3 refills | Status: AC
  Filled 2024-01-25: qty 2, 1d supply, fill #0

## 2024-02-01 ENCOUNTER — Other Ambulatory Visit (HOSPITAL_COMMUNITY): Payer: Self-pay

## 2024-02-01 MED ORDER — ERGOCALCIFEROL 1.25 MG (50000 UT) PO CAPS
50000.0000 [IU] | ORAL_CAPSULE | ORAL | 5 refills | Status: DC
  Filled 2024-02-01: qty 4, 28d supply, fill #0
  Filled 2024-03-03: qty 4, 28d supply, fill #1
  Filled 2024-04-07: qty 4, 28d supply, fill #2

## 2024-02-07 ENCOUNTER — Other Ambulatory Visit (HOSPITAL_COMMUNITY): Payer: Self-pay

## 2024-02-08 ENCOUNTER — Other Ambulatory Visit (HOSPITAL_COMMUNITY): Payer: Self-pay

## 2024-02-09 ENCOUNTER — Other Ambulatory Visit (HOSPITAL_COMMUNITY): Payer: Self-pay

## 2024-02-22 ENCOUNTER — Other Ambulatory Visit (HOSPITAL_COMMUNITY): Payer: Self-pay

## 2024-02-22 MED ORDER — NALOXONE HCL 4 MG/0.1ML NA LIQD
NASAL | 3 refills | Status: DC
  Filled 2024-02-22: qty 2, 30d supply, fill #0

## 2024-02-22 MED ORDER — HYDROCODONE-ACETAMINOPHEN 10-325 MG PO TABS
1.0000 | ORAL_TABLET | Freq: Three times a day (TID) | ORAL | 0 refills | Status: DC | PRN
  Filled 2024-02-22: qty 90, 30d supply, fill #0

## 2024-03-03 ENCOUNTER — Other Ambulatory Visit (HOSPITAL_COMMUNITY): Payer: Self-pay

## 2024-03-18 ENCOUNTER — Other Ambulatory Visit (HOSPITAL_COMMUNITY): Payer: Self-pay

## 2024-03-18 MED ORDER — DICLOFENAC SODIUM 75 MG PO TBEC
75.0000 mg | DELAYED_RELEASE_TABLET | Freq: Two times a day (BID) | ORAL | 2 refills | Status: AC
  Filled 2024-03-18: qty 60, 30d supply, fill #0
  Filled 2024-05-25 – 2024-06-22 (×2): qty 60, 30d supply, fill #1

## 2024-03-22 ENCOUNTER — Other Ambulatory Visit (HOSPITAL_COMMUNITY): Payer: Self-pay

## 2024-03-22 MED ORDER — HYDROCODONE-ACETAMINOPHEN 10-325 MG PO TABS
ORAL_TABLET | Freq: Three times a day (TID) | ORAL | 0 refills | Status: DC
  Filled 2024-03-22: qty 90, 30d supply, fill #0

## 2024-03-22 MED ORDER — VITAMIN D (ERGOCALCIFEROL) 1.25 MG (50000 UNIT) PO CAPS
50000.0000 [IU] | ORAL_CAPSULE | ORAL | 5 refills | Status: DC
  Filled 2024-03-22 – 2024-04-07 (×2): qty 4, 28d supply, fill #0

## 2024-04-07 ENCOUNTER — Other Ambulatory Visit: Payer: Self-pay

## 2024-04-07 ENCOUNTER — Other Ambulatory Visit: Payer: Self-pay | Admitting: Family

## 2024-04-07 ENCOUNTER — Other Ambulatory Visit (HOSPITAL_COMMUNITY): Payer: Self-pay

## 2024-04-07 DIAGNOSIS — I10 Essential (primary) hypertension: Secondary | ICD-10-CM

## 2024-04-08 ENCOUNTER — Other Ambulatory Visit (HOSPITAL_COMMUNITY): Payer: Self-pay

## 2024-04-08 MED ORDER — VALSARTAN 80 MG PO TABS
80.0000 mg | ORAL_TABLET | Freq: Every day | ORAL | 0 refills | Status: DC
  Filled 2024-04-08 – 2024-04-27 (×2): qty 30, 30d supply, fill #0
  Filled 2024-05-25: qty 30, 30d supply, fill #1
  Filled 2024-06-22: qty 30, 30d supply, fill #2

## 2024-04-08 NOTE — Telephone Encounter (Signed)
 Complete

## 2024-04-08 NOTE — Telephone Encounter (Signed)
 Requested medication (s) are due for refill today- yes  Requested medication (s) are on the active medication list -yes  Future visit scheduled -no  Last refill: 01/14/24 #90  Notes to clinic: (Please update medication list- dose was increased at last visit- but lower dose not removed)Please up date and refill appropriate dose  Requested Prescriptions  Pending Prescriptions Disp Refills   valsartan  (DIOVAN ) 80 MG tablet 90 tablet 0    Sig: Take 1 tablet (80 mg total) by mouth daily.     Cardiovascular:  Angiotensin Receptor Blockers Failed - 04/08/2024  1:51 PM      Failed - Cr in normal range and within 180 days    Creatinine, Ser  Date Value Ref Range Status  01/14/2024 0.72 (L) 0.76 - 1.27 mg/dL Final         Failed - K in normal range and within 180 days    Potassium  Date Value Ref Range Status  01/14/2024 6.1 (H) 3.5 - 5.2 mmol/L Final         Failed - Last BP in normal range    BP Readings from Last 1 Encounters:  01/14/24 (!) 141/92         Passed - Patient is not pregnant      Passed - Valid encounter within last 6 months    Recent Outpatient Visits           2 months ago Primary hypertension   [facility], [place], [place]   6 months ago Primary hypertension   [facility] [facility], [place], [place]   1 year ago Primary hypertension   [place] [place] [facility], [place], [place]   1 year ago Nonspecific chest pain   [facility], [place], [place]   1 year ago Primary hypertension   [place] [facility], Amy J, NP                 Requested Prescriptions  Pending Prescriptions Disp Refills   valsartan  (DIOVAN ) 80 MG tablet 90 tablet 0    Sig: Take 1 tablet (80 mg total) by mouth daily.     Cardiovascular:  Angiotensin Receptor Blockers Failed - 04/08/2024  1:51 PM      Failed - Cr in normal range and within 180  days    Creatinine, Ser  Date Value Ref Range Status  01/14/2024 0.72 (L) 0.76 - 1.27 mg/dL Final         Failed - K in normal range and within 180 days    Potassium  Date Value Ref Range Status  01/14/2024 6.1 (H) 3.5 - 5.2 mmol/L Final         Failed - Last BP in normal range    BP Readings from Last 1 Encounters:  01/14/24 (!) 141/92         Passed - Patient is not pregnant      Passed - Valid encounter within last 6 months    Recent Outpatient Visits           2 months ago Primary hypertension   [facility] [facility], [place], [place]   6 months ago Primary hypertension   [place] [facility], [place], [place]   1 year ago Primary hypertension   [facility] [facility]  [facility] Senaida Dama, NP   1 year ago Nonspecific chest pain   [place] [facility], [place], [place]   1 year ago Primary hypertension   [place] [facility], Annalee Barren, NP

## 2024-04-19 ENCOUNTER — Other Ambulatory Visit (HOSPITAL_COMMUNITY): Payer: Self-pay

## 2024-04-20 ENCOUNTER — Other Ambulatory Visit (HOSPITAL_COMMUNITY): Payer: Self-pay

## 2024-04-20 MED ORDER — VITAMIN D (ERGOCALCIFEROL) 1.25 MG (50000 UNIT) PO CAPS
50000.0000 [IU] | ORAL_CAPSULE | ORAL | 5 refills | Status: DC
  Filled 2024-04-20 – 2024-04-27 (×2): qty 4, 28d supply, fill #0

## 2024-04-20 MED ORDER — HYDROCODONE-ACETAMINOPHEN 10-325 MG PO TABS
1.0000 | ORAL_TABLET | Freq: Three times a day (TID) | ORAL | 0 refills | Status: DC
  Filled 2024-04-20: qty 90, 30d supply, fill #0

## 2024-04-21 ENCOUNTER — Ambulatory Visit: Payer: Self-pay

## 2024-04-21 ENCOUNTER — Ambulatory Visit (INDEPENDENT_AMBULATORY_CARE_PROVIDER_SITE_OTHER): Admitting: Family Medicine

## 2024-04-21 ENCOUNTER — Encounter: Payer: Self-pay | Admitting: Family Medicine

## 2024-04-21 VITALS — BP 132/84 | HR 91 | Temp 98.1°F | Resp 16 | Ht 72.0 in | Wt 304.4 lb

## 2024-04-21 DIAGNOSIS — M791 Myalgia, unspecified site: Secondary | ICD-10-CM | POA: Diagnosis not present

## 2024-04-21 DIAGNOSIS — Z6841 Body Mass Index (BMI) 40.0 and over, adult: Secondary | ICD-10-CM | POA: Diagnosis not present

## 2024-04-21 DIAGNOSIS — E66813 Obesity, class 3: Secondary | ICD-10-CM

## 2024-04-21 NOTE — Telephone Encounter (Signed)
 Noted, will draw lab as well from feb.

## 2024-04-21 NOTE — Progress Notes (Signed)
 Patient is  concern about getting random muscle cramps in his legs.Patient said it has been going on for a few weeks.

## 2024-04-21 NOTE — Progress Notes (Signed)
 Established Patient Office Visit  Subjective    Patient ID: Darrell Conway, male    DOB: 13-Sep-1966  Age: 58 y.o. MRN: [id number]  CC:  Chief Complaint  Patient presents with  . Medical Management of Chronic Issues    HPI Darrell Conway presents with complaint of muscle pains and/or spasms for the past several weeks. He has been doing his best to be hydrated. He is on lipitor.   Outpatient Encounter Medications as of 04/21/2024  Medication Sig  . acetaminophen  (TYLENOL ) 325 MG tablet Take 1-2 tablets (325-650 mg total) by mouth every 6 (six) hours as needed for mild pain (pain score 1-3 or temp > 100.5).  . amLODipine  (NORVASC ) 10 MG tablet Take 1 tablet (10 mg total) by mouth daily.  . azithromycin  (ZITHROMAX ) 250 MG tablet Take 2 tablets (500 mg total) by mouth daily for 1 day. if not better, follow up with your doctor  . clotrimazole -betamethasone  (LOTRISONE ) cream Use on affected area 2 times daily until resolution and then 1 more week  . colchicine  0.6 MG tablet Take 1 tablet (0.6 mg total) by mouth daily.  . cyclobenzaprine  (FLEXERIL ) 10 MG tablet Take 1 tablet (10 mg total) by mouth every 8 (eight) hours as needed for up to 10 days.  . diclofenac  (VOLTAREN ) 75 MG EC tablet Take 1 tablet (75 mg total) by mouth 2 (two) times daily with a meal  . EPINEPHrine  0.3 mg/0.3 mL IJ SOAJ injection Inject 0.3 mg into the muscle as needed for anaphylaxis.  . ergocalciferol  (VITAMIN D2) 1.25 MG (50000 UT) capsule Take 1 capsule (50,000 Units total) by mouth once a week.  . gabapentin  (NEURONTIN ) 300 MG capsule Take by mouth.  . HYDROcodone -acetaminophen  (NORCO) 10-325 MG tablet Take 1 tablet by mouth 3 (three) times daily as needed.  . HYDROcodone -acetaminophen  (NORCO) 10-325 MG tablet Take 1 tablet by mouth 3 (three) times daily as needed.  . HYDROcodone -acetaminophen  (NORCO) 10-325 MG tablet Take 1 tablet by mouth 3 (three) times daily as needed.  . HYDROcodone -acetaminophen  (NORCO) 10-325  MG tablet Take 1 tablet by mouth 3 (three) times daily as needed.  . HYDROcodone -acetaminophen  (NORCO) 10-325 MG tablet Take 1 Tablet by mouth three times daily as needed  . HYDROcodone -acetaminophen  (NORCO) 10-325 MG tablet Take 1 tablet by mouth 3 (three) times daily as needed for pain  . HYDROcodone -acetaminophen  (NORCO) 10-325 MG tablet Take 1 tablet by mouth 3 (three) times daily as needed for pain.  . HYDROcodone -acetaminophen  (NORCO/VICODIN) 5-325 MG tablet Take by mouth.  . lisinopril  (ZESTRIL ) 20 MG tablet Take by mouth.  . meloxicam  (MOBIC ) 15 MG tablet Take 1 tablet (15 mg total) by mouth daily.  . Multiple Vitamin (MULTIVITAMIN) capsule Take 1 capsule by mouth daily.  . naloxone  (NARCAN ) nasal spray 4 mg/0.1 mL Instill 1 spray in one nostril every 3 minutes; spray 1 dose.  Alternate nostrils with each dose until help arrives  . naloxone  (NARCAN ) nasal spray 4 mg/0.1 mL Use 1 spray every 3 minutes; spray 1 dose into ONE nostril; alternate nostrils with each dose until help arrives  . predniSONE  (DELTASONE ) 20 MG tablet Take 1 tablet (20 mg total) by mouth daily for 5 days  . predniSONE  (STERAPRED UNI-PAK 21 TAB) 5 MG (21) TBPK tablet Take according to package instructions.  . sildenafil  (VIAGRA ) 50 MG tablet Take 1 tablet 1/2 hour to 1 hour prior to intercourse as needed. Limit use to 1/2 tablet or 1 tablet per 24 hours.  . triamcinolone  cream (  KENALOG ) 0.1 % Apply 1 Application topically 2 (two) times a week for 30 days  . valACYclovir  (VALTREX ) 1000 MG tablet Take 3 tablets (3,000 mg total) by mouth daily for 7 days  . valsartan  (DIOVAN ) 40 MG tablet Take 1 tablet (40 mg) by mouth daily.  . valsartan  (DIOVAN ) 80 MG tablet Take 1 tablet (80 mg total) by mouth daily.  . Vitamin D , Ergocalciferol , (DRISDOL ) 1.25 MG (50000 UNIT) CAPS capsule Take 1 capsule (50,000 Units total) by mouth once a week.  . Vitamin D , Ergocalciferol , (DRISDOL ) 1.25 MG (50000 UNIT) CAPS capsule Take 1 capsule  (50,000 Units total) by mouth once a week.  . atorvastatin  (LIPITOR) 40 MG tablet Take 1 tablet (40 mg total) by mouth daily.  . pantoprazole  (PROTONIX ) 40 MG tablet Take 1 tablet (40 mg total) by mouth 2 (two) times daily before a meal.  . [DISCONTINUED] fluticasone  (FLONASE ) 50 MCG/ACT nasal spray Place 2 sprays into both nostrils daily.  . [DISCONTINUED] orlistat  (ALLI ) 60 MG capsule Take 1 capsule (60 mg total) by mouth 3 (three) times daily with meals.   No facility-administered encounter medications on file as of 04/21/2024.    Past Medical History:  Diagnosis Date  . Anemia    pt denies  . Back pain   . GERD (gastroesophageal reflux disease)   . Gout   . Heartburn   . High cholesterol   . Hip pain   . Hypertension   . Knee pain   . Pneumonia   . Rheumatoid arthritis (HCC)   . Sleep apnea    no Cpap does not wear  . Swallowing difficulty     Past Surgical History:  Procedure Laterality Date  . HERNIA REPAIR    . TOTAL KNEE ARTHROPLASTY Right 07/30/2021   Procedure: TOTAL KNEE ARTHROPLASTY;  Surgeon: Claiborne Crew, MD;  Location: [facility];  Service: Orthopedics;  Laterality: Right;    Family History  Problem Relation Age of Onset  . Rheum arthritis Mother   . Hypertension Mother   . Rheum arthritis Sister     Social History   Socioeconomic History  . Marital status: Married    Spouse name: Not on file  . Number of children: Not on file  . Years of education: Not on file  . Highest education level: Some college, no degree  Occupational History  . Occupation: Product/process development scientist  Tobacco Use  . Smoking status: Former    Current packs/day: 0.00    Types: Cigarettes    Quit date: 2015    Years since quitting: 10.4    Passive exposure: Past  . Smokeless tobacco: Never  Vaping Use  . Vaping status: Never Used  Substance and Sexual Activity  . Alcohol use: Not Currently  . Drug use: No  . Sexual activity: Not Currently    Birth control/protection: None   Other Topics Concern  . Not on file  Social History Narrative  . Not on file   Social Drivers of Health   Financial Resource Strain: Low Risk  (01/14/2024)   Overall Financial Resource Strain (CARDIA)   . Difficulty of Paying Living Expenses: Not hard at all  Food Insecurity: No Food Insecurity (01/14/2024)   Hunger Vital Sign   . Worried About Programme researcher, broadcasting/film/video in the Last Year: Never true   . Ran Out of Food in the Last Year: Never true  Transportation Needs: No Transportation Needs (01/14/2024)   PRAPARE - Transportation   . Lack of Transportation (  Medical): No   . Lack of Transportation (Non-Medical): No  Physical Activity: Insufficiently Active (01/14/2024)   Exercise Vital Sign   . Days of Exercise per Week: 3 days   . Minutes of Exercise per Session: 30 min  Stress: No Stress Concern Present (01/14/2024)   [employer] of Occupational Health - Occupational Stress Questionnaire   . Feeling of Stress : Not at all  Social Connections: Moderately Integrated (01/14/2024)   Social Connection and Isolation Panel [NHANES]   . Frequency of Communication with Friends and Family: More than three times a week   . Frequency of Social Gatherings with Friends and Family: More than three times a week   . Attends Religious Services: More than 4 times per year   . Active Member of Clubs or Organizations: No   . Attends Banker Meetings: Not on file   . Marital Status: Married  Catering manager Violence: Not on file    Review of Systems  All other systems reviewed and are negative.       Objective    BP 132/84   Pulse 91   Temp 98.1 F (36.7 C) (Oral)   Resp 16   Ht 6' (1.829 m)   Wt (!) 304 lb 6.4 oz (138.1 kg)   SpO2 91%   BMI 41.28 kg/m   Physical Exam Vitals and nursing note reviewed.  Constitutional:      General: He is not in acute distress.    Appearance: He is obese.  Cardiovascular:     Rate and Rhythm: Normal rate and regular rhythm.   Pulmonary:     Effort: Pulmonary effort is normal.     Breath sounds: Normal breath sounds.  Abdominal:     Palpations: Abdomen is soft.     Tenderness: There is no abdominal tenderness.  Neurological:     General: No focal deficit present.     Mental Status: He is alert and oriented to person, place, and time.        Assessment & Plan:   Muscle pain  Class 3 severe obesity with serious comorbidity and body mass index (BMI) of 40.0 to 44.9 in adult, unspecified obesity type   Will d/c statin and monitor  No follow-ups on file.   Arlo Lama, MD

## 2024-04-27 ENCOUNTER — Other Ambulatory Visit: Payer: Self-pay | Admitting: Family

## 2024-04-27 ENCOUNTER — Other Ambulatory Visit (HOSPITAL_COMMUNITY): Payer: Self-pay

## 2024-04-27 ENCOUNTER — Other Ambulatory Visit: Payer: Self-pay

## 2024-04-27 DIAGNOSIS — I10 Essential (primary) hypertension: Secondary | ICD-10-CM

## 2024-04-27 MED ORDER — AMLODIPINE BESYLATE 10 MG PO TABS
10.0000 mg | ORAL_TABLET | Freq: Every day | ORAL | 0 refills | Status: DC
  Filled 2024-04-27: qty 90, 90d supply, fill #0
  Filled 2024-05-06: qty 30, 30d supply, fill #0
  Filled 2024-06-14: qty 30, 30d supply, fill #1
  Filled 2024-06-22 – 2024-07-29 (×2): qty 30, 30d supply, fill #2

## 2024-05-06 ENCOUNTER — Other Ambulatory Visit (HOSPITAL_COMMUNITY): Payer: Self-pay

## 2024-05-06 ENCOUNTER — Other Ambulatory Visit: Payer: Self-pay

## 2024-05-13 ENCOUNTER — Other Ambulatory Visit (HOSPITAL_COMMUNITY): Payer: Self-pay

## 2024-05-18 ENCOUNTER — Other Ambulatory Visit (HOSPITAL_COMMUNITY): Payer: Self-pay

## 2024-05-18 MED ORDER — HYDROCODONE-ACETAMINOPHEN 10-325 MG PO TABS
1.0000 | ORAL_TABLET | Freq: Three times a day (TID) | ORAL | 0 refills | Status: DC | PRN
  Filled 2024-05-18: qty 90, 30d supply, fill #0

## 2024-05-18 MED ORDER — VITAMIN D (ERGOCALCIFEROL) 1.25 MG (50000 UNIT) PO CAPS
50000.0000 [IU] | ORAL_CAPSULE | ORAL | 5 refills | Status: AC
  Filled 2024-05-18: qty 4, 28d supply, fill #0
  Filled 2024-06-22: qty 4, 28d supply, fill #1

## 2024-05-24 ENCOUNTER — Ambulatory Visit: Admitting: Physician Assistant

## 2024-05-24 ENCOUNTER — Encounter: Admitting: Family

## 2024-05-24 ENCOUNTER — Telehealth: Payer: Self-pay | Admitting: Family

## 2024-05-24 NOTE — Telephone Encounter (Signed)
 Called patient to reschedule missed appointment no answer left voicemail

## 2024-05-24 NOTE — Progress Notes (Signed)
 Erroneous encounter-disregard

## 2024-05-25 ENCOUNTER — Other Ambulatory Visit: Payer: Self-pay

## 2024-05-25 ENCOUNTER — Other Ambulatory Visit: Payer: Self-pay | Admitting: Family

## 2024-05-25 ENCOUNTER — Other Ambulatory Visit (HOSPITAL_COMMUNITY): Payer: Self-pay

## 2024-05-25 ENCOUNTER — Ambulatory Visit: Admitting: Cardiology

## 2024-05-25 DIAGNOSIS — M109 Gout, unspecified: Secondary | ICD-10-CM

## 2024-05-25 MED ORDER — COLCHICINE 0.6 MG PO TABS
0.6000 mg | ORAL_TABLET | Freq: Every day | ORAL | 0 refills | Status: DC
  Filled 2024-05-25 – 2024-06-22 (×2): qty 30, 30d supply, fill #0

## 2024-05-25 NOTE — Telephone Encounter (Signed)
 Complete

## 2024-05-26 ENCOUNTER — Other Ambulatory Visit (HOSPITAL_COMMUNITY): Payer: Self-pay

## 2024-05-26 ENCOUNTER — Encounter: Payer: Self-pay | Admitting: Emergency Medicine

## 2024-05-26 ENCOUNTER — Ambulatory Visit
Admission: EM | Admit: 2024-05-26 | Discharge: 2024-05-26 | Disposition: A | Discharge status: Home / Self Care | Attending: Emergency Medicine | Admitting: Emergency Medicine

## 2024-05-26 DIAGNOSIS — H60391 Other infective otitis externa, right ear: Secondary | ICD-10-CM | POA: Diagnosis not present

## 2024-05-26 MED ORDER — OFLOXACIN 0.3 % OT SOLN
10.0000 [drp] | Freq: Every day | OTIC | 0 refills | Status: AC
  Filled 2024-05-26: qty 5, 10d supply, fill #0

## 2024-05-26 NOTE — ED Provider Notes (Signed)
 [facility]    CSN: [id number] Arrival date & time: 05/26/24  0803      History   Chief Complaint Chief Complaint  Patient presents with   Otalgia    HPI Darrell Conway is a 58 y.o. male.   Patient presents to clinic over concern of right ear pain and swelling for the past few days.  Initially noticed some kind of foreign body sensation so he attempted to clean his ear with Q-tips.  He did not get much from it other than some dark brown wax.  Woke up the next day and felt the same sensation so they put hydrogen peroxide in it and it burned.  Noticed today that it was swollen and painful.  Has not really had any drainage from it.  Left ear is fine.  Denies fevers or recent illness.  The history is provided by the patient and medical records.  Otalgia   Past Medical History:  Diagnosis Date   Anemia    pt denies   Back pain    GERD (gastroesophageal reflux disease)    Gout    Heartburn    High cholesterol    Hip pain    Hypertension    Knee pain    Pneumonia    Rheumatoid arthritis (HCC)    Sleep apnea    no Cpap does not wear   Swallowing difficulty     Patient Active Problem List   Diagnosis Date Noted   Neck pain 09/03/2023   Hypertensive disorder 05/23/2022   Gout 05/23/2022   Osteoarthritis of left knee 01/19/2022   Chronic pain of left ankle 12/17/2021   S/P total knee arthroplasty, right 07/30/2021   History of total knee arthroplasty 07/30/2021   Essential hypertension 01/03/2021   Osteoarthritis of right knee 11/09/2020   Pain in joint of right knee 11/09/2020   Low back pain 10/15/2020   Irregular heart beat 02/14/2020   Personal history of COVID-19 02/13/2020   Amnesia 02/13/2020   Poor concentration 02/13/2020   Muscle pain 02/13/2020   Unilateral primary osteoarthritis, left hip 01/31/2020   Osteoarthritis of left hip 01/31/2020   Morbid obesity (HCC) 06/29/2019   Primary osteoarthritis of both knees 05/05/2019   Primary  osteoarthritis of both hips    Multiple joint pain 04/26/2019   Pain in left foot 04/26/2019   Pain of both hip joints 04/26/2019   Obstructive sleep apnea of adult 04/11/2019   Laryngopharyngeal reflux 04/11/2019    Past Surgical History:  Procedure Laterality Date   HERNIA REPAIR     TOTAL KNEE ARTHROPLASTY Right 07/30/2021   Procedure: TOTAL KNEE ARTHROPLASTY;  Surgeon: Ernie Cough, MD;  Location: [facility];  Service: Orthopedics;  Laterality: Right;       Home Medications    Prior to Admission medications   Medication Sig Start Date End Date Taking? Authorizing Provider  HYDROcodone -acetaminophen  (NORCO) 10-325 MG tablet Take 1 tablet by mouth 3 (three) times daily as needed. 11/27/23  Yes   ofloxacin (FLOXIN) 0.3 % OTIC solution Place 10 drops into the right ear daily for 5 days. 05/26/24 06/05/24 Yes Aubery Date  N, FNP  acetaminophen  (TYLENOL ) 325 MG tablet Take 1-2 tablets (325-650 mg total) by mouth every 6 (six) hours as needed for mild pain (pain score 1-3 or temp > 100.5). 08/02/21   Patti Rosina SAUNDERS, PA-C  amLODipine  (NORVASC ) 10 MG tablet Take 1 tablet (10 mg total) by mouth daily. 04/27/24 07/26/24  Lorren Greig PARAS, NP  atorvastatin  (LIPITOR) 40 MG tablet Take 1 tablet (40 mg total) by mouth daily. 01/14/24 06/24/24  Lorren Greig PARAS, NP  azithromycin  (ZITHROMAX ) 250 MG tablet Take 2 tablets (500 mg total) by mouth daily for 1 day. if not better, follow up with your doctor 12/29/23     clotrimazole -betamethasone  (LOTRISONE ) cream Use on affected area 2 times daily until resolution and then 1 more week 12/29/23     colchicine  0.6 MG tablet Take 1 tablet (0.6 mg total) by mouth daily. 05/25/24   Lorren Greig PARAS, NP  cyclobenzaprine  (FLEXERIL ) 10 MG tablet Take 1 tablet (10 mg total) by mouth every 8 (eight) hours as needed for up to 10 days. 08/26/23     diclofenac  (VOLTAREN ) 75 MG EC tablet Take 1 tablet (75 mg total) by mouth 2 (two) times daily with a meal 03/18/24     EPINEPHrine  0.3  mg/0.3 mL IJ SOAJ injection Inject 0.3 mg into the muscle as needed for anaphylaxis. 12/22/22   Lorren Greig PARAS, NP  ergocalciferol  (VITAMIN D2) 1.25 MG (50000 UT) capsule Take 1 capsule (50,000 Units total) by mouth once a week. 02/01/24     gabapentin  (NEURONTIN ) 300 MG capsule Take by mouth. 03/21/19   [provider]  HYDROcodone -acetaminophen  (NORCO) 10-325 MG tablet Take 1 tablet by mouth 3 (three) times daily as needed. 12/25/22     HYDROcodone -acetaminophen  (NORCO) 10-325 MG tablet Take 1 tablet by mouth 3 (three) times daily as needed. 01/23/23     HYDROcodone -acetaminophen  (NORCO) 10-325 MG tablet Take 1 tablet by mouth 3 (three) times daily as needed. 09/22/23     HYDROcodone -acetaminophen  (NORCO) 10-325 MG tablet Take 1 Tablet by mouth three times daily as needed 12/28/23     HYDROcodone -acetaminophen  (NORCO) 10-325 MG tablet Take 1 tablet by mouth 3 (three) times daily as needed for pain 02/22/24     HYDROcodone -acetaminophen  (NORCO) 10-325 MG tablet Take 1 tablet by mouth 3 (three) times daily as needed for pain. 04/20/24     HYDROcodone -acetaminophen  (NORCO) 10-325 MG tablet Take 1 tablet by mouth 3 (three) times daily as needed for pain 05/18/24     HYDROcodone -acetaminophen  (NORCO/VICODIN) 5-325 MG tablet Take by mouth. 03/17/16   [provider]  lisinopril  (ZESTRIL ) 20 MG tablet Take by mouth. 03/21/19   [provider]  meloxicam  (MOBIC ) 15 MG tablet Take 1 tablet (15 mg total) by mouth daily. 07/24/23     Multiple Vitamin (MULTIVITAMIN) capsule Take 1 capsule by mouth daily.    [provider]  naloxone  (NARCAN ) nasal spray 4 mg/0.1 mL Instill 1 spray in one nostril every 3 minutes; spray 1 dose.  Alternate nostrils with each dose until help arrives 01/25/24     naloxone  (NARCAN ) nasal spray 4 mg/0.1 mL Use 1 spray every 3 minutes; spray 1 dose into ONE nostril; alternate nostrils with each dose until help arrives 02/22/24     pantoprazole  (PROTONIX ) 40 MG  tablet Take 1 tablet (40 mg total) by mouth 2 (two) times daily before a meal. 01/01/24 06/24/24  Lorren Greig PARAS, NP  predniSONE  (DELTASONE ) 20 MG tablet Take 1 tablet (20 mg total) by mouth daily for 5 days 12/30/23     predniSONE  (STERAPRED UNI-PAK 21 TAB) 5 MG (21) TBPK tablet Take according to package instructions. 10/15/23   Donah Penne LABOR, PA-C  sildenafil  (VIAGRA ) 50 MG tablet Take 1 tablet 1/2 hour to 1 hour prior to intercourse as needed. Limit use to 1/2 tablet or 1 tablet per 24  hours. 01/14/24   Lorren Greig PARAS, NP  triamcinolone  cream (KENALOG ) 0.1 % Apply 1 Application topically 2 (two) times a week for 30 days 12/31/23     valACYclovir  (VALTREX ) 1000 MG tablet Take 3 tablets (3,000 mg total) by mouth daily for 7 days 12/30/23     valsartan  (DIOVAN ) 80 MG tablet Take 1 tablet (80 mg total) by mouth daily. 04/08/24   Lorren Greig PARAS, NP  Vitamin D , Ergocalciferol , (DRISDOL ) 1.25 MG (50000 UNIT) CAPS capsule Take 1 capsule (50,000 Units total) by mouth once a week. 03/22/24   Jerrye Lamar CHRISTELLA Mickey., MD  Vitamin D , Ergocalciferol , (DRISDOL ) 1.25 MG (50000 UNIT) CAPS capsule Take 1 capsule (50,000 Units total) by mouth once a week. 04/20/24     Vitamin D , Ergocalciferol , (DRISDOL ) 1.25 MG (50000 UNIT) CAPS capsule Take 1 capsule (50,000 Units total) by mouth once a week. 05/18/24   Leron Millman, NP  fluticasone  (FLONASE ) 50 MCG/ACT nasal spray Place 2 sprays into both nostrils daily. 01/06/20 07/29/20  Prentiss Dorothyann Maxwell, MD  orlistat  (ALLI ) 60 MG capsule Take 1 capsule (60 mg total) by mouth 3 (three) times daily with meals. 02/29/20 07/29/20  Prentiss Dorothyann Maxwell, MD    Family History Family History  Problem Relation Age of Onset   Rheum arthritis Mother    Hypertension Mother    Rheum arthritis Sister     Social History Social History   Tobacco Use   Smoking status: Former    Current packs/day: 0.00    Types: Cigarettes    Quit date: 2015    Years since quitting: 10.5    Passive  exposure: Past   Smokeless tobacco: Never  Vaping Use   Vaping status: Never Used  Substance Use Topics   Alcohol use: Not Currently   Drug use: No     Allergies   Lisinopril    Review of Systems Review of Systems  Per HPI  Physical Exam Triage Vital Signs ED Triage Vitals  Encounter Vitals Group     BP 05/26/24 0823 131/85     Girls Systolic BP Percentile --      Girls Diastolic BP Percentile --      Boys Systolic BP Percentile --      Boys Diastolic BP Percentile --      Pulse Rate 05/26/24 0823 76     Resp 05/26/24 0823 18     Temp 05/26/24 0823 98.2 F (36.8 C)     Temp Source 05/26/24 0823 Oral     SpO2 05/26/24 0823 95 %     Weight 05/26/24 0821 (!) 304 lb 7.3 oz (138.1 kg)     Height --      Head Circumference --      Peak Flow --      Pain Score 05/26/24 0821 10     Pain Loc --      Pain Education --      Exclude from Growth Chart --    No data found.  Updated Vital Signs BP 131/85 (BP Location: Left Arm)   Pulse 76   Temp 98.2 F (36.8 C) (Oral)   Resp 18   Wt (!) 304 lb 7.3 oz (138.1 kg)   SpO2 95%   BMI 41.29 kg/m   Visual Acuity Right Eye Distance:   Left Eye Distance:   Bilateral Distance:    Right Eye Near:   Left Eye Near:    Bilateral Near:     Physical Exam Vitals and  nursing note reviewed.  Constitutional:      Appearance: Normal appearance.  HENT:     Head: Normocephalic and atraumatic.     Right Ear: Tympanic membrane normal. Drainage, swelling and tenderness present.     Left Ear: Tympanic membrane, ear canal and external ear normal.     Nose: Nose normal.     Mouth/Throat:     Mouth: Mucous membranes are moist.  Eyes:     Conjunctiva/sclera: Conjunctivae normal.  Cardiovascular:     Rate and Rhythm: Normal rate.  Pulmonary:     Effort: Pulmonary effort is normal. No respiratory distress.  Musculoskeletal:        General: Normal range of motion.  Skin:    General: Skin is warm and dry.  Neurological:      General: No focal deficit present.     Mental Status: He is alert.  Psychiatric:        Mood and Affect: Mood normal.        Behavior: Behavior is cooperative.      UC Treatments / Results  Labs (all labs ordered are listed, but only abnormal results are displayed) Labs Reviewed - No data to display  EKG   Radiology No results found.  Procedures Procedures (including critical care time)  Medications Ordered in UC Medications - No data to display  Initial Impression / Assessment and Plan / UC Course  I have reviewed the triage vital signs and the nursing notes.  Pertinent labs & imaging results that were available during my care of the patient were reviewed by me and considered in my medical decision making (see chart for details).  Vitals in triage reviewed, patient is hemodynamically stable.  Right external auditory canal is swollen with purulent drainage.  Tympanic membrane intact.  Will treat for otitis externa.  Pain management discussed.  Plan of care, follow-up care return precautions given, no questions at this time.     Final Clinical Impressions(s) / UC Diagnoses   Final diagnoses:  Other infective acute otitis externa of right ear     Discharge Instructions      Use the eardrops once daily for the next 5 days.  I suggest laying down on your left ear to place the 10 drops and letting them sit for a few minutes to fully absorb.  Avoid any water  in the ear canal, you can place a cottonball in the external ear canal while showering to help that.  He can alternate between 800 mg of ibuprofen  and 500 mg of Tylenol  every 4-6 hours for any pain.  Symptoms should improve over the next few days, if they do not improve please follow-up with primary care or return to clinic.    ED Prescriptions     Medication Sig Dispense Auth. Provider   ofloxacin (FLOXIN) 0.3 % OTIC solution Place 10 drops into the right ear daily for 5 days. 5 mL [name], Nargis Abrams  N, FNP       PDMP not reviewed this encounter.   [name], Jonnelle [place]  N, [place] 05/26/24 [phone]

## 2024-05-26 NOTE — ED Triage Notes (Addendum)
 Pt presents c/o right ear pain and swelling since yesterday. Pt reports he started digging in his ear with qtips after excessive itching started and now there is pain.

## 2024-05-26 NOTE — Discharge Instructions (Signed)
 Use the eardrops once daily for the next 5 days.  I suggest laying down on your left ear to place the 10 drops and letting them sit for a few minutes to fully absorb.  Avoid any water  in the ear canal, you can place a cottonball in the external ear canal while showering to help that.  He can alternate between 800 mg of ibuprofen  and 500 mg of Tylenol  every 4-6 hours for any pain.  Symptoms should improve over the next few days, if they do not improve please follow-up with primary care or return to clinic.

## 2024-05-27 ENCOUNTER — Emergency Department (HOSPITAL_BASED_OUTPATIENT_CLINIC_OR_DEPARTMENT_OTHER)
Admission: EM | Admit: 2024-05-27 | Discharge: 2024-05-27 | Disposition: A | Discharge status: Home / Self Care | Attending: Emergency Medicine | Admitting: Emergency Medicine

## 2024-05-27 ENCOUNTER — Encounter (HOSPITAL_BASED_OUTPATIENT_CLINIC_OR_DEPARTMENT_OTHER): Payer: Self-pay

## 2024-05-27 ENCOUNTER — Other Ambulatory Visit: Payer: Self-pay

## 2024-05-27 DIAGNOSIS — H6691 Otitis media, unspecified, right ear: Secondary | ICD-10-CM | POA: Diagnosis not present

## 2024-05-27 DIAGNOSIS — H6690 Otitis media, unspecified, unspecified ear: Secondary | ICD-10-CM

## 2024-05-27 DIAGNOSIS — H60501 Unspecified acute noninfective otitis externa, right ear: Secondary | ICD-10-CM | POA: Insufficient documentation

## 2024-05-27 DIAGNOSIS — H9201 Otalgia, right ear: Secondary | ICD-10-CM | POA: Diagnosis present

## 2024-05-27 MED ORDER — AMOXICILLIN-POT CLAVULANATE 875-125 MG PO TABS: 1.0000 | ORAL_TABLET | Freq: Two times a day (BID) | ORAL | 0 refills | Status: DC

## 2024-05-27 MED ORDER — AMOXICILLIN-POT CLAVULANATE 875-125 MG PO TABS
1.0000 | ORAL_TABLET | Freq: Once | ORAL | Status: AC
  Administered 2024-05-27: 1 via ORAL
  Filled 2024-05-27: qty 1

## 2024-05-27 NOTE — Discharge Instructions (Signed)
 You were seen in the emerged from for a right ear infection You should continue using the treatment drops that you were given for an outer ear infection yesterday We have also given you an antibiotic in case there is a middle ear infection Pick up the prescription of Augmentin  from your [facility] on [address] and take as directed Follow-up with your primary care doctor within 1 week for reevaluation If your symptoms do not improve we gave you the number for an ear nose and throat doctor to follow-up with in the office Return to the emergency department for severe pain, if you are unable to swallow or have swelling underneath your chin or for any other concerns

## 2024-05-27 NOTE — ED Triage Notes (Signed)
 Pt reports being diagnosed with ear infection x3 days ago. Pt prescribed abx for infection. Pt reports ear fullness, balance issues and fever. Pt last took abx this AM.

## 2024-05-27 NOTE — ED Provider Notes (Signed)
 [place] [facility] AT [facility] Provider Note   CSN: [id number] Arrival date & time: 05/27/24  9279     Patient presents with: Ear Fullness and Fever   Darrell Conway is a 58 y.o. male.  Who presents to the ED for right ear pain.  Patient has had now several days of pain and swelling in the right ear.  He was seen for this reason at urgent care yesterday and was diagnosed with otitis externa discharged with ofloxacin  drops.  He started taking this yesterday but returns today with persistent drainage discomfort and swelling in the right ear.  Reduced hearing.  Also reports a fever today.  No nausea vomiting shaking chills sinus congestion or trouble breathing.  Does not have diabetes and he is not immunocompromised    Ear Fullness  Fever      Prior to Admission medications   Medication Sig Start Date End Date Taking? Authorizing Provider  amoxicillin -clavulanate (AUGMENTIN ) 875-125 MG tablet Take 1 tablet by mouth every 12 (twelve) hours. 05/27/24  Yes Pamella Ozell LABOR, DO  acetaminophen  (TYLENOL ) 325 MG tablet Take 1-2 tablets (325-650 mg total) by mouth every 6 (six) hours as needed for mild pain (pain score 1-3 or temp > 100.5). 08/02/21   Patti Rosina SAUNDERS, PA-C  amLODipine  (NORVASC ) 10 MG tablet Take 1 tablet (10 mg total) by mouth daily. 04/27/24 07/26/24  Lorren Greig PARAS, NP  atorvastatin  (LIPITOR) 40 MG tablet Take 1 tablet (40 mg total) by mouth daily. 01/14/24 06/24/24  Lorren Greig PARAS, NP  azithromycin  (ZITHROMAX ) 250 MG tablet Take 2 tablets (500 mg total) by mouth daily for 1 day. if not better, follow up with your doctor 12/29/23     clotrimazole -betamethasone  (LOTRISONE ) cream Use on affected area 2 times daily until resolution and then 1 more week 12/29/23     colchicine  0.6 MG tablet Take 1 tablet (0.6 mg total) by mouth daily. 05/25/24   Lorren Greig PARAS, NP  cyclobenzaprine  (FLEXERIL ) 10 MG tablet Take 1 tablet (10 mg total) by mouth every 8 (eight) hours as  needed for up to 10 days. 08/26/23     diclofenac  (VOLTAREN ) 75 MG EC tablet Take 1 tablet (75 mg total) by mouth 2 (two) times daily with a meal 03/18/24     EPINEPHrine  0.3 mg/0.3 mL IJ SOAJ injection Inject 0.3 mg into the muscle as needed for anaphylaxis. 12/22/22   Lorren Greig PARAS, NP  ergocalciferol  (VITAMIN D2) 1.25 MG (50000 UT) capsule Take 1 capsule (50,000 Units total) by mouth once a week. 02/01/24     gabapentin  (NEURONTIN ) 300 MG capsule Take by mouth. 03/21/19   [provider]  HYDROcodone -acetaminophen  (NORCO) 10-325 MG tablet Take 1 tablet by mouth 3 (three) times daily as needed. 12/25/22     HYDROcodone -acetaminophen  (NORCO) 10-325 MG tablet Take 1 tablet by mouth 3 (three) times daily as needed. 01/23/23     HYDROcodone -acetaminophen  (NORCO) 10-325 MG tablet Take 1 tablet by mouth 3 (three) times daily as needed. 09/22/23     HYDROcodone -acetaminophen  (NORCO) 10-325 MG tablet Take 1 tablet by mouth 3 (three) times daily as needed. 11/27/23     HYDROcodone -acetaminophen  (NORCO) 10-325 MG tablet Take 1 Tablet by mouth three times daily as needed 12/28/23     HYDROcodone -acetaminophen  (NORCO) 10-325 MG tablet Take 1 tablet by mouth 3 (three) times daily as needed for pain 02/22/24     HYDROcodone -acetaminophen  (NORCO) 10-325 MG tablet Take 1 tablet by mouth 3 (three) times daily  as needed for pain. 04/20/24     HYDROcodone -acetaminophen  (NORCO) 10-325 MG tablet Take 1 tablet by mouth 3 (three) times daily as needed for pain 05/18/24     HYDROcodone -acetaminophen  (NORCO/VICODIN) 5-325 MG tablet Take by mouth. 03/17/16   [provider]  lisinopril  (ZESTRIL ) 20 MG tablet Take by mouth. 03/21/19   [provider]  meloxicam  (MOBIC ) 15 MG tablet Take 1 tablet (15 mg total) by mouth daily. 07/24/23     Multiple Vitamin (MULTIVITAMIN) capsule Take 1 capsule by mouth daily.    [provider]  naloxone  (NARCAN ) nasal spray 4 mg/0.1 mL Instill 1 spray in one nostril every  3 minutes; spray 1 dose.  Alternate nostrils with each dose until help arrives 01/25/24     naloxone  (NARCAN ) nasal spray 4 mg/0.1 mL Use 1 spray every 3 minutes; spray 1 dose into ONE nostril; alternate nostrils with each dose until help arrives 02/22/24     ofloxacin  (FLOXIN ) 0.3 % OTIC solution Place 10 drops into the right ear daily for 5 days. 05/26/24 06/05/24  [name], [name]  N, FNP  pantoprazole  (PROTONIX ) 40 MG tablet Take 1 tablet (40 mg total) by mouth 2 (two) times daily before a meal. 01/01/24 06/24/24  Lorren Greig PARAS, NP  predniSONE  (DELTASONE ) 20 MG tablet Take 1 tablet (20 mg total) by mouth daily for 5 days 12/30/23     predniSONE  (STERAPRED UNI-PAK 21 TAB) 5 MG (21) TBPK tablet Take according to package instructions. 10/15/23   Donah Penne LABOR, PA-C  sildenafil  (VIAGRA ) 50 MG tablet Take 1 tablet 1/2 hour to 1 hour prior to intercourse as needed. Limit use to 1/2 tablet or 1 tablet per 24 hours. 01/14/24   Lorren Greig PARAS, NP  triamcinolone  cream (KENALOG ) 0.1 % Apply 1 Application topically 2 (two) times a week for 30 days 12/31/23     valACYclovir  (VALTREX ) 1000 MG tablet Take 3 tablets (3,000 mg total) by mouth daily for 7 days 12/30/23     valsartan  (DIOVAN ) 80 MG tablet Take 1 tablet (80 mg total) by mouth daily. 04/08/24   Lorren Greig PARAS, NP  Vitamin D , Ergocalciferol , (DRISDOL ) 1.25 MG (50000 UNIT) CAPS capsule Take 1 capsule (50,000 Units total) by mouth once a week. 03/22/24   Jerrye Lamar CHRISTELLA Mickey., MD  Vitamin D , Ergocalciferol , (DRISDOL ) 1.25 MG (50000 UNIT) CAPS capsule Take 1 capsule (50,000 Units total) by mouth once a week. 04/20/24     Vitamin D , Ergocalciferol , (DRISDOL ) 1.25 MG (50000 UNIT) CAPS capsule Take 1 capsule (50,000 Units total) by mouth once a week. 05/18/24   Leron Millman, NP  fluticasone  (FLONASE ) 50 MCG/ACT nasal spray Place 2 sprays into both nostrils daily. 01/06/20 07/29/20  Prentiss Dorothyann Maxwell, MD  orlistat  (ALLI ) 60 MG capsule Take 1 capsule (60 mg total) by  mouth 3 (three) times daily with meals. 02/29/20 07/29/20  Prentiss Dorothyann Maxwell, MD    Allergies: Lisinopril     Review of Systems  Constitutional:  Positive for fever.    Updated Vital Signs BP 137/79 (BP Location: Right Arm)   Pulse 93   Temp 99 F (37.2 C) (Oral)   Resp 18   Ht 6' (1.829 m)   Wt 136.1 kg   SpO2 93%   BMI 40.69 kg/m   Physical Exam Vitals and nursing note reviewed.  HENT:     Head: Normocephalic and atraumatic.     Ears:     Comments: Tenderness and edema over right pinna Ear canal purulent  debris and otorrhea Unable to visualize right TM    Nose: Nose normal. No congestion.     Mouth/Throat:     Pharynx: Oropharynx is clear.  Eyes:     Pupils: Pupils are equal, round, and reactive to light.  Cardiovascular:     Rate and Rhythm: Normal rate and regular rhythm.  Pulmonary:     Effort: Pulmonary effort is normal.     Breath sounds: Normal breath sounds.  Abdominal:     Palpations: Abdomen is soft.     Tenderness: There is no abdominal tenderness.  Musculoskeletal:     Cervical back: Neck supple. No tenderness.  Skin:    General: Skin is warm and dry.  Neurological:     Mental Status: He is alert.  Psychiatric:        Mood and Affect: Mood normal.     (all labs ordered are listed, but only abnormal results are displayed) Labs Reviewed - No data to display  EKG: None  Radiology: No results found.   Procedures   Medications Ordered in the ED  amoxicillin -clavulanate (AUGMENTIN ) 875-125 MG per tablet 1 tablet (has no administration in time range)                                    Medical Decision Making 58 year old male with history as above presenting for persistent ear pain drainage and swelling.  Was diagnosed yesterday with otitis externa.  Has been taking the ofloxacin  drops as directed.  Exam notable for tenderness of the pinna with debris and drainage from the right ear.  Unable to visualize right TM.  He may have a  concurrent right otitis media in addition to right otitis externa.  Will give a dose of Augmentin  here and discharged with 7 days Augmentin  twice daily and instruct for PCP follow-up.  Will provide number for ENT if symptoms do not improve in a timely fashion.  Low suspicion for malignant otitis externa as he is not diabetic or immunocompromised and is only a few days of symptoms  Risk Prescription drug management.        Final diagnoses:  Acute otitis externa of right ear, unspecified type  Acute otitis media, unspecified otitis media type    ED Discharge Orders          Ordered    amoxicillin -clavulanate (AUGMENTIN ) 875-125 MG tablet  Every 12 hours        05/27/24 0836               Lether Tesch A, DO 05/27/24 [phone]

## 2024-06-03 ENCOUNTER — Other Ambulatory Visit (HOSPITAL_COMMUNITY): Payer: Self-pay

## 2024-06-11 ENCOUNTER — Observation Stay (HOSPITAL_BASED_OUTPATIENT_CLINIC_OR_DEPARTMENT_OTHER)
Admission: EM | Admit: 2024-06-11 | Discharge: 2024-06-11 | Disposition: A | Discharge status: Home / Self Care | Attending: Internal Medicine | Admitting: Internal Medicine

## 2024-06-11 ENCOUNTER — Emergency Department (HOSPITAL_BASED_OUTPATIENT_CLINIC_OR_DEPARTMENT_OTHER)

## 2024-06-11 ENCOUNTER — Observation Stay (HOSPITAL_BASED_OUTPATIENT_CLINIC_OR_DEPARTMENT_OTHER)

## 2024-06-11 ENCOUNTER — Other Ambulatory Visit: Payer: Self-pay

## 2024-06-11 ENCOUNTER — Encounter (HOSPITAL_COMMUNITY): Payer: Self-pay | Admitting: Internal Medicine

## 2024-06-11 DIAGNOSIS — R0789 Other chest pain: Principal | ICD-10-CM | POA: Insufficient documentation

## 2024-06-11 DIAGNOSIS — I200 Unstable angina: Secondary | ICD-10-CM | POA: Diagnosis present

## 2024-06-11 DIAGNOSIS — D72829 Elevated white blood cell count, unspecified: Secondary | ICD-10-CM | POA: Diagnosis not present

## 2024-06-11 DIAGNOSIS — E7800 Pure hypercholesterolemia, unspecified: Secondary | ICD-10-CM | POA: Diagnosis not present

## 2024-06-11 DIAGNOSIS — Z7982 Long term (current) use of aspirin: Secondary | ICD-10-CM | POA: Diagnosis not present

## 2024-06-11 DIAGNOSIS — R7303 Prediabetes: Secondary | ICD-10-CM | POA: Insufficient documentation

## 2024-06-11 DIAGNOSIS — Z743 Need for continuous supervision: Secondary | ICD-10-CM | POA: Diagnosis not present

## 2024-06-11 DIAGNOSIS — Z87891 Personal history of nicotine dependence: Secondary | ICD-10-CM | POA: Diagnosis not present

## 2024-06-11 DIAGNOSIS — K219 Gastro-esophageal reflux disease without esophagitis: Secondary | ICD-10-CM | POA: Insufficient documentation

## 2024-06-11 DIAGNOSIS — I10 Essential (primary) hypertension: Secondary | ICD-10-CM

## 2024-06-11 DIAGNOSIS — R079 Chest pain, unspecified: Secondary | ICD-10-CM | POA: Diagnosis present

## 2024-06-11 DIAGNOSIS — Z6841 Body Mass Index (BMI) 40.0 and over, adult: Secondary | ICD-10-CM | POA: Insufficient documentation

## 2024-06-11 DIAGNOSIS — G4733 Obstructive sleep apnea (adult) (pediatric): Secondary | ICD-10-CM | POA: Diagnosis not present

## 2024-06-11 LAB — CBC
HCT: 39.7 % (ref 39.0–52.0)
HCT: 40.9 % (ref 39.0–52.0)
Hemoglobin: 13.9 g/dL (ref 13.0–17.0)
Hemoglobin: 13.9 g/dL (ref 13.0–17.0)
MCH: 29.6 pg (ref 26.0–34.0)
MCH: 29.1 pg (ref 26.0–34.0)
MCHC: 35 g/dL (ref 30.0–36.0)
MCHC: 34 g/dL (ref 30.0–36.0)
MCV: 84.6 fL (ref 80.0–100.0)
MCV: 85.7 fL (ref 80.0–100.0)
Platelets: 241 K/uL (ref 150–400)
Platelets: 248 K/uL (ref 150–400)
RBC: 4.69 MIL/uL (ref 4.22–5.81)
RBC: 4.77 MIL/uL (ref 4.22–5.81)
RDW: 13.5 % (ref 11.5–15.5)
RDW: 13.2 % (ref 11.5–15.5)
WBC: 11.2 K/uL — ABNORMAL HIGH (ref 4.0–10.5)
WBC: 10 K/uL (ref 4.0–10.5)
nRBC: 0 % (ref 0.0–0.2)
nRBC: 0 % (ref 0.0–0.2)

## 2024-06-11 LAB — BASIC METABOLIC PANEL WITH GFR
Anion gap: 13 (ref 5–15)
Anion gap: 12 (ref 5–15)
BUN: 12 mg/dL (ref 6–20)
BUN: 9 mg/dL (ref 6–20)
CO2: 23 mmol/L (ref 22–32)
CO2: 23 mmol/L (ref 22–32)
Calcium: 9.6 mg/dL (ref 8.9–10.3)
Calcium: 9.3 mg/dL (ref 8.9–10.3)
Chloride: 104 mmol/L (ref 98–111)
Chloride: 104 mmol/L (ref 98–111)
Creatinine, Ser: 0.73 mg/dL (ref 0.61–1.24)
Creatinine, Ser: 0.65 mg/dL (ref 0.61–1.24)
GFR, Estimated: >60 mL/min (ref >60)
GFR, Estimated: >60 mL/min (ref >60)
Glucose, Bld: 125 mg/dL — ABNORMAL HIGH (ref 70–99)
Glucose, Bld: 102 mg/dL — ABNORMAL HIGH (ref 70–99)
Potassium: 3.7 mmol/L (ref 3.5–5.1)
Potassium: 4.1 mmol/L (ref 3.5–5.1)
Sodium: 140 mmol/L (ref 135–145)
Sodium: 139 mmol/L (ref 135–145)

## 2024-06-11 LAB — ECHOCARDIOGRAM COMPLETE
AR max vel: 4.21 cm2
AV Peak grad: 8.2 mmHg
Ao pk vel: 1.43 m/s
Area-P 1/2: 3.53 cm2
Height: 72 in
S' Lateral: 2.8 cm
Weight: 4765.46 [oz_av]

## 2024-06-11 LAB — LIPID PANEL
Cholesterol: 190 mg/dL (ref 0–200)
Cholesterol: 198 mg/dL (ref 0–200)
HDL: 40 mg/dL — ABNORMAL LOW (ref >40)
HDL: 39 mg/dL — ABNORMAL LOW (ref >40)
LDL Cholesterol: 118 mg/dL — ABNORMAL HIGH (ref 0–99)
LDL Cholesterol: 144 mg/dL — ABNORMAL HIGH (ref 0–99)
Total CHOL/HDL Ratio: 4.8 ratio
Total CHOL/HDL Ratio: 5.1 ratio
Triglycerides: 161 mg/dL — ABNORMAL HIGH (ref <150)
Triglycerides: 75 mg/dL (ref <150)
VLDL: 32 mg/dL (ref 0–40)
VLDL: 15 mg/dL (ref 0–40)

## 2024-06-11 LAB — TROPONIN I (HIGH SENSITIVITY): Troponin I (High Sensitivity): 7 ng/L (ref <18)

## 2024-06-11 LAB — HEMOGLOBIN A1C
Hgb A1c MFr Bld: 5.8 % — ABNORMAL HIGH (ref 4.8–5.6)
Hgb A1c MFr Bld: 5.6 % (ref 4.8–5.6)
Mean Plasma Glucose: 119.76 mg/dL
Mean Plasma Glucose: 114.02 mg/dL

## 2024-06-11 LAB — TROPONIN T, HIGH SENSITIVITY
Troponin T High Sensitivity: <15 ng/L (ref <19)
Troponin T High Sensitivity: <15 ng/L (ref <19)

## 2024-06-11 MED ORDER — ASPIRIN 81 MG PO TBEC: 81.0000 mg | DELAYED_RELEASE_TABLET | Freq: Every day | ORAL | Status: AC

## 2024-06-11 MED ORDER — ASPIRIN 81 MG PO CHEW
324.0000 mg | CHEWABLE_TABLET | Freq: Once | ORAL | Status: AC
  Administered 2024-06-11: 324 mg via ORAL
  Filled 2024-06-11: qty 4

## 2024-06-11 MED ORDER — PHENOL 1.4 % MT LIQD
1.0000 | OROMUCOSAL | Status: DC | PRN
  Administered 2024-06-11: 1 via OROMUCOSAL
  Filled 2024-06-11: qty 177

## 2024-06-11 MED ORDER — IRBESARTAN 75 MG PO TABS
75.0000 mg | ORAL_TABLET | Freq: Every day | ORAL | Status: DC
  Administered 2024-06-11: 75 mg via ORAL
  Filled 2024-06-11: qty 1

## 2024-06-11 MED ORDER — ONDANSETRON HCL 4 MG/2ML IJ SOLN: 4.0000 mg | Freq: Four times a day (QID) | INTRAMUSCULAR | Status: DC | PRN

## 2024-06-11 MED ORDER — HYDROCODONE-ACETAMINOPHEN 10-325 MG PO TABS
1.0000 | ORAL_TABLET | Freq: Three times a day (TID) | ORAL | Status: DC | PRN
  Administered 2024-06-11: 1 via ORAL
  Filled 2024-06-11: qty 1

## 2024-06-11 MED ORDER — ATORVASTATIN CALCIUM 40 MG PO TABS
40.0000 mg | ORAL_TABLET | Freq: Every day | ORAL | Status: DC
  Administered 2024-06-11: 40 mg via ORAL
  Filled 2024-06-11: qty 1

## 2024-06-11 MED ORDER — ASPIRIN 81 MG PO TBEC
81.0000 mg | DELAYED_RELEASE_TABLET | Freq: Every day | ORAL | Status: DC
  Administered 2024-06-11: 81 mg via ORAL
  Filled 2024-06-11: qty 1

## 2024-06-11 MED ORDER — EZETIMIBE 10 MG PO TABS: 10.0000 mg | ORAL_TABLET | Freq: Every day | ORAL | 1 refills | Status: AC

## 2024-06-11 MED ORDER — ACETAMINOPHEN 325 MG PO TABS: 650.0000 mg | ORAL_TABLET | ORAL | Status: DC | PRN

## 2024-06-11 MED ORDER — ATORVASTATIN CALCIUM 40 MG PO TABS: 40.0000 mg | ORAL_TABLET | Freq: Every day | ORAL | Status: DC

## 2024-06-11 MED ORDER — NITROGLYCERIN 0.4 MG SL SUBL
0.4000 mg | SUBLINGUAL_TABLET | SUBLINGUAL | Status: DC | PRN
  Administered 2024-06-11: 0.4 mg via SUBLINGUAL
  Filled 2024-06-11: qty 1

## 2024-06-11 MED ORDER — ENOXAPARIN SODIUM 60 MG/0.6ML IJ SOSY
60.0000 mg | PREFILLED_SYRINGE | INTRAMUSCULAR | Status: DC
  Administered 2024-06-11: 60 mg via SUBCUTANEOUS
  Filled 2024-06-11: qty 0.6

## 2024-06-11 MED ORDER — ACETAMINOPHEN 325 MG PO TABS: 650.0000 mg | ORAL_TABLET | Freq: Once | ORAL | Status: DC

## 2024-06-11 MED ORDER — AMLODIPINE BESYLATE 10 MG PO TABS
10.0000 mg | ORAL_TABLET | Freq: Every day | ORAL | Status: DC
  Administered 2024-06-11: 10 mg via ORAL
  Filled 2024-06-11: qty 1

## 2024-06-11 MED ORDER — IOHEXOL 350 MG/ML SOLN
100.0000 mL | Freq: Once | INTRAVENOUS | Status: AC | PRN
  Administered 2024-06-11: 75 mL via INTRAVENOUS

## 2024-06-11 MED ORDER — IRBESARTAN 75 MG PO TABS: 75.0000 mg | ORAL_TABLET | Freq: Every day | ORAL | Status: DC

## 2024-06-11 MED ORDER — PANTOPRAZOLE SODIUM 40 MG PO TBEC
40.0000 mg | DELAYED_RELEASE_TABLET | Freq: Two times a day (BID) | ORAL | Status: DC
  Administered 2024-06-11: 40 mg via ORAL
  Filled 2024-06-11 (×2): qty 1

## 2024-06-11 MED ORDER — NITROGLYCERIN 0.4 MG SL SUBL: 0.4000 mg | SUBLINGUAL_TABLET | SUBLINGUAL | 1 refills | Status: AC | PRN

## 2024-06-11 NOTE — Progress Notes (Signed)
 Patient is AXOX4 with no clinical signs of distress or complaints of pain at this time. Patient remains on room air and placed on continuous telemetry.   Patient denies chest pain and dizziness at this time.   Safety measures are in place, call light is within reach, and 4P's addressed.

## 2024-06-11 NOTE — Progress Notes (Signed)
 Progress Note  Patient Name: Darrell Conway Date of Encounter: 06/11/2024  Primary Cardiologist: None   Subjective   Patient seen examined by his bedside, his wife was with the bedside when I arrived.  He denies any chest pain or shortness of breath or palpitations at this time.  He was lying in bed  Inpatient Medications    Scheduled Meds:  acetaminophen   650 mg Oral Once   amLODipine   10 mg Oral Daily   aspirin  EC  81 mg Oral Daily   atorvastatin   40 mg Oral Daily   enoxaparin  (LOVENOX ) injection  60 mg Subcutaneous Q24H   irbesartan   75 mg Oral Daily   pantoprazole   40 mg Oral BID AC   Continuous Infusions:  PRN Meds: acetaminophen , HYDROcodone -acetaminophen , nitroGLYCERIN , ondansetron  (ZOFRAN ) IV, phenol   Vital Signs    Vitals:   06/11/24 0515 06/11/24 0522 06/11/24 0754 06/11/24 1153  BP: 108/64 (!) 139/95 126/75 130/82  Pulse:  88 73   Resp: 20 18 16 16   Temp:  98.2 F (36.8 C) 98.6 F (37 C) 98.5 F (36.9 C)  TempSrc:  Oral Oral Oral  SpO2:  95% 94%   Weight:  135.1 kg    Height:  6' (1.829 m)     No intake or output data in the 24 hours ending 06/11/24 1220 Filed Weights   06/11/24 0522  Weight: 135.1 kg    Telemetry    Sinus rhythm- Personally Reviewed  ECG     - Personally Reviewed  Physical Exam   General: Comfortable Head: Atraumatic, normal size  Eyes: PEERLA, EOMI  Neck: Supple, normal JVD Cardiac: Normal S1, S2; RRR; no murmurs, rubs, or gallops Lungs: Clear to auscultation bilaterally Abd: Soft, nontender, no hepatomegaly  Ext: warm, no edema Musculoskeletal: No deformities, BUE and BLE strength normal and equal   Labs    Chemistry Recent Labs  Lab 06/11/24 0126 06/11/24 0833  NA 140 139  K 3.7 4.1  CL 104 104  CO2 23 23  GLUCOSE 125* 102*  BUN 12 9  CREATININE 0.73 0.65  CALCIUM  9.6 9.3  GFRNONAA >60 >60  ANIONGAP 13 12     Hematology Recent Labs  Lab 06/11/24 0126 06/11/24 0833  WBC 11.2* 10.0  RBC  4.69 4.77  HGB 13.9 13.9  HCT 39.7 40.9  MCV 84.6 85.7  MCH 29.6 29.1  MCHC 35.0 34.0  RDW 13.5 13.2  PLT 241 248    Cardiac EnzymesNo results for input(s): TROPONINI in the last 168 hours. No results for input(s): TROPIPOC in the last 168 hours.   BNPNo results for input(s): BNP, PROBNP in the last 168 hours.   DDimer No results for input(s): DDIMER in the last 168 hours.   Radiology    CT Angio Chest PE W/Cm &/Or Wo Cm Result Date: 06/11/2024 EXAM: CTA of the Chest without and with IV contrast for PE 06/11/2024 02:48:16 AM TECHNIQUE: CTA of the chest was performed after the administration of intravenous contrast. Multiplanar reformatted images are provided for review. MIP images are provided for review. Automated exposure control, iterative reconstruction, and/or weight based adjustment of the mA/kV was utilized to reduce the radiation dose to as low as reasonably achievable. COMPARISON: Chest radiograph earlier today. CLINICAL HISTORY: Pulmonary embolism (PE) suspected, high prob. Pt reports all of the sudden heaviness and tightness in the center of his chest that woke him up out of his sleep. Associated with SOB, dizzy, and diaphoretic. FINDINGS: PULMONARY ARTERIES: Pulmonary  arteries are adequately opacified for evaluation. No evidence of pulmonary embolism. MEDIASTINUM: The heart and pericardium demonstrate no acute abnormality. Although not tailored for evaluation of the thoracic aorta, there is no evidence of thoracic aneurysm or dissection. LYMPH NODES: No mediastinal, hilar or axillary lymphadenopathy. LUNGS AND PLEURA: Mild dependent atelectasis in the bilateral upper and lower lobes. No focal consolidation or pulmonary edema. No pleural effusion or pneumothorax. UPPER ABDOMEN: Limited images of the upper abdomen are unremarkable. SOFT TISSUES AND BONES: Mild degenerative changes in the mid to lower thoracic spine. No acute bone or soft tissue abnormality. IMPRESSION: 1. No  evidence of pulmonary embolism. 2. Scattered atelectasis. 3. No acute cardiopulmonary abnormality. Electronically signed by: Pinkie Pebbles MD 06/11/2024 02:56 AM EDT RP Workstation: [id number]   DG Chest Portable 1 View Result Date: 06/11/2024 EXAM: 1 VIEW XRAY OF THE CHEST 06/11/2024 12:38:00 AM COMPARISON: 12/14/2022 CLINICAL HISTORY: CP. all of the sudden heaviness and tightness in the center of his chest that woke him up out of his sleep. Associated with SOB, dizzy, and diaphoretic. FINDINGS: LUNGS AND PLEURA: Mild left basilar opacity, likely atelectasis. No pulmonary edema. No pleural effusion. No pneumothorax. HEART AND MEDIASTINUM: No acute abnormality of the cardiac and mediastinal silhouettes. BONES AND SOFT TISSUES: No acute osseous abnormality. IMPRESSION: 1. No acute process. 2. Mild left basilar opacity, likely atelectasis. Electronically signed by: Pinkie Pebbles MD 06/11/2024 12:45 AM EDT RP Workstation: [id number]    Cardiac Studies   Echo pending   Patient Profile     58 y.o. male hypertension, prediabetes hemoglobin A1c 6.4 in October 2024, hyperlipidemia, GERD, OSA on CPAP, obesity  Assessment & Plan    Chest discomfort since-has resolved Hypertension Prediabetes Hyperlipidemia OSA on CPAP  Thankfully he is no longer experiencing chest discomfort.  We were really able to have the patient walk in chest pain was not reproducible on exertion. Troponin has been negative x 2 during his evaluation.  CTA of the chest did not show any evidence of pulmonary embolism with no noted coronary calcification. EKG reviewed by me T wave inversion in the lead III, aVF, V5 and V6 (inferolateral leads), but this is not new it was present on his EKG which was done in January 2024 in March 2019.  His echocardiogram is pending.  From a cardiovascular standpoint if his echocardiogram is normal with no wall motion abnormalities and ejection fraction is also normal it would be reasonable to  discharge the patient to home with close follow-up for outpatient testing/coronary CTA versus cardiac PET given he is asymptomatic his blood pressure has been controlled as well.  In an effort to avoid significant renal injury given he just had his CT of the chest this morning it would be best to avoid using further contrast by getting coronary CTA in the inpatient setting.  In terms of his prediabetes compared to October 2024 it seems that he has done great work with diet modification his hemoglobin A1c is 5.6  Unfortunately LDL is still elevated he is on Lipitor 40 mg at home.  It will be beneficial for us  to add Zetia  to his regimen to help to get his LDL less than 100.  Will add in lipoprotein a.  This will help us  with risk stratification.  Continue CPAP  Plan discussed with his attending if his echocardiogram is completely normal with no wall motion abnormality and normal ejection fraction is reasonable for him to be discharged home if clinical setting does not change and he develops  chest discomfort.     For questions or updates, please contact [facility] Please consult [email or web address] for contact info under Cardiology/STEMI.      Signed, Fortune Torosian, DO  06/11/2024, 12:20 PM

## 2024-06-11 NOTE — ED Triage Notes (Signed)
 Pt reports all of the sudden heaviness and tightness in the center of his chest that woke him up out of his sleep. Associated with SOB, dizzy, and diaphoretic.    Was 91-92% on room air placed on 2 L [place] on arrival

## 2024-06-11 NOTE — ED Notes (Signed)
 Lab called charge RN to inform RN pt needs redraw on labs. Repeat labs drawn, 4 light green tubes sent with rpt purple top.

## 2024-06-11 NOTE — Consult Note (Signed)
 Cardiology Consultation   Patient ID: Nehemiah Mcfarren MRN: [id number]; DOB: [birth year]  Admit date: 06/11/2024 Date of Consult: 06/11/2024  PCP:  Lorren Greig PARAS, NP   [facility] Providers Cardiologist:  None        Patient Profile: Darrell Conway is a 58 y.o. male with a history of hypertension and remote history of cigarette use who is being seen 06/11/2024 for the evaluation of chest pain at the request of Dr. Sundil.  History of Present Illness: Darrell Conway states at around 11:40 pm yesterday morning he was awakened with chest pain.  He describes the pain as squeezing, upper center chest, radiating to his neck, and associated with shortness of breath and dizziness.  He recalls feeling like his heart was racing at the time.  His wife drove him to [facility] Emergency Department for further evaluation.  He states that his chest pain did resolve after one hour with getting nitroglycerin  sublingual.  He is now chest pain free.  He denies similar chest pain prior or any concerns for heart attacks previously.  He reports for the past week he has felt more tired and fatigued.  He does report some swelling in his ankles.  He denies shortness of breath with lying flat, waking up due to shortness of breath, or feeling like he was going to faint.  Patient was transferred to our facility for further evaluation.  At the [facility] Emergency Department he was assessed for NSTEMI-ACS with HS-troponin all negative.  Chest CT angiography didn't demonstrate pulmonary embolism and no coronary calcification identified.  Patient was given high-dose aspirin  only.   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 Authorizing Provider  acetaminophen  (TYLENOL ) 325 MG tablet Take 1-2 tablets (325-650 mg total) by mouth every 6 (six) hours as needed for mild pain (pain score 1-3 or temp > 100.5). 08/02/21   Patti Rosina SAUNDERS, PA-C  amLODipine  (NORVASC ) 10 MG tablet Take 1 tablet (10 mg total) by mouth daily. 04/27/24 07/26/24  Lorren Greig PARAS, NP  amoxicillin -clavulanate (AUGMENTIN ) 875-125 MG tablet Take 1 tablet by mouth every 12 (twelve) hours. 05/27/24   Pamella Ozell LABOR, DO  atorvastatin  (LIPITOR) 40 MG tablet Take 1 tablet (40 mg total) by mouth daily. 01/14/24 04/13/24  Lorren Greig PARAS, NP  azithromycin  (ZITHROMAX ) 250 MG tablet Take 2 tablets (500 mg total) by mouth daily for 1 day. if not better, follow up with your doctor 12/29/23     clotrimazole -betamethasone  (LOTRISONE ) cream Use on affected area 2 times daily until resolution and then 1 more week 12/29/23     colchicine  0.6 MG tablet Take 1 tablet (0.6 mg total) by mouth daily. 05/25/24   Lorren, Amy  J, NP  cyclobenzaprine  (FLEXERIL ) 10 MG tablet Take 1 tablet (10 mg total) by mouth every 8 (eight) hours as needed for up to 10 days. 08/26/23     diclofenac  (VOLTAREN ) 75 MG EC tablet Take 1 tablet (75 mg total) by mouth 2 (two) times daily with a meal 03/18/24     EPINEPHrine  0.3 mg/0.3 mL IJ SOAJ injection Inject 0.3 mg into the muscle as needed for anaphylaxis. 12/22/22   Lorren Greig PARAS, NP  ergocalciferol  (VITAMIN D2) 1.25 MG (50000 UT) capsule Take 1 capsule (50,000 Units total) by mouth once a week. 02/01/24     gabapentin  (NEURONTIN ) 300 MG capsule Take by mouth. 03/21/19   [provider]   HYDROcodone -acetaminophen  (NORCO) 10-325 MG tablet Take 1 tablet by mouth 3 (three) times daily as needed. 12/25/22     HYDROcodone -acetaminophen  (NORCO) 10-325 MG tablet Take 1 tablet by mouth 3 (three) times daily as needed. 01/23/23     HYDROcodone -acetaminophen  (NORCO) 10-325 MG tablet Take 1 tablet by mouth 3 (three) times daily as needed. 09/22/23     HYDROcodone -acetaminophen  (NORCO) 10-325 MG tablet Take 1 tablet by mouth 3 (three) times daily as needed. 11/27/23     HYDROcodone -acetaminophen  (NORCO) 10-325 MG tablet Take 1 Tablet by mouth three times daily as needed 12/28/23     HYDROcodone -acetaminophen  (NORCO) 10-325 MG tablet Take 1 tablet by mouth 3 (three) times daily as needed for pain 02/22/24     HYDROcodone -acetaminophen  (NORCO) 10-325 MG tablet Take 1 tablet by mouth 3 (three) times daily as needed for pain. 04/20/24     HYDROcodone -acetaminophen  (NORCO) 10-325 MG tablet Take 1 tablet by mouth 3 (three) times daily as needed for pain 05/18/24     HYDROcodone -acetaminophen  (NORCO/VICODIN) 5-325 MG tablet Take by mouth. 03/17/16   [provider]  lisinopril  (ZESTRIL ) 20 MG tablet Take by mouth. 03/21/19   [provider]  meloxicam  (MOBIC ) 15 MG tablet Take 1 tablet (15 mg total) by mouth daily. 07/24/23     Multiple Vitamin (MULTIVITAMIN) capsule Take 1 capsule by mouth daily.    [provider]  naloxone  (NARCAN ) nasal spray 4 mg/0.1 mL Instill 1 spray in one nostril every 3 minutes; spray 1 dose.  Alternate nostrils with each dose until help arrives 01/25/24     naloxone  (NARCAN ) nasal spray 4 mg/0.1 mL Use 1 spray every 3 minutes; spray 1 dose into ONE nostril; alternate nostrils with each dose until help arrives 02/22/24     pantoprazole  (PROTONIX ) 40 MG tablet Take 1 tablet (40 mg total) by mouth 2 (two) times daily before a meal. 01/01/24 03/31/24  Lorren Greig PARAS, NP  predniSONE  (DELTASONE ) 20 MG tablet Take 1 tablet (20 mg total) by mouth daily for 5 days 12/30/23      predniSONE  (STERAPRED UNI-PAK 21 TAB) 5 MG (21) TBPK tablet Take according to package instructions. 10/15/23   Donah Riis A, PA-C  sildenafil  (VIAGRA ) 50 MG tablet Take 1 tablet 1/2 hour to 1 hour prior to intercourse as needed. Limit use to 1/2 tablet or 1 tablet per 24 hours. 01/14/24   Lorren Greig PARAS, NP  triamcinolone  cream (KENALOG ) 0.1 % Apply 1 Application topically 2 (two) times a week for 30 days 12/31/23     valACYclovir  (VALTREX ) 1000 MG tablet Take 3 tablets (3,000 mg total) by mouth daily for 7 days 12/30/23     valsartan  (DIOVAN ) 80 MG tablet Take 1 tablet (80 mg total) by mouth daily. 04/08/24   Lorren,  Amy J, NP  Vitamin D , Ergocalciferol , (DRISDOL ) 1.25 MG (50000 UNIT) CAPS capsule Take 1 capsule (50,000 Units total) by mouth once a week. 03/22/24   Jerrye Lamar CHRISTELLA Mickey., MD  Vitamin D , Ergocalciferol , (DRISDOL ) 1.25 MG (50000 UNIT) CAPS capsule Take 1 capsule (50,000 Units total) by mouth once a week. 04/20/24     Vitamin D , Ergocalciferol , (DRISDOL ) 1.25 MG (50000 UNIT) CAPS capsule Take 1 capsule (50,000 Units total) by mouth once a week. 05/18/24   Leron Millman, NP  fluticasone  (FLONASE ) 50 MCG/ACT nasal spray Place 2 sprays into both nostrils daily. 01/06/20 07/29/20  Prentiss Dorothyann Maxwell, MD  orlistat  (ALLI ) 60 MG capsule Take 1 capsule (60 mg total) by mouth 3 (three) times daily with meals. 02/29/20 07/29/20  Prentiss Dorothyann Maxwell, MD    Scheduled Meds:  acetaminophen   650 mg Oral Once   aspirin  EC  81 mg Oral Daily   pantoprazole   40 mg Oral BID AC   Continuous Infusions:  PRN Meds: nitroGLYCERIN   Allergies:    Allergies  Allergen Reactions   Lisinopril  Swelling and Rash    Still taking the medication at home.  Pt reports getting welts on skin around sides/stomach and elsewhere.  He also reports mild lip swell.    Social History:   Social History   Socioeconomic History   Marital status: Married    Spouse name: Not on file   Number of children: Not on  file   Years of education: Not on file   Highest education level: Some college, no degree  Occupational History   Occupation: Product/process development scientist  Tobacco Use   Smoking status: Former    Current packs/day: 0.00    Types: Cigarettes    Quit date: 2015    Years since quitting: 10.5    Passive exposure: Past   Smokeless tobacco: Never  Vaping Use   Vaping status: Never Used  Substance and Sexual Activity   Alcohol use: Not Currently   Drug use: No   Sexual activity: Not Currently    Birth control/protection: None  Other Topics Concern   Not on file  Social History Narrative   Not on file   Social Drivers of Health   Financial Resource Strain: Low Risk  (01/14/2024)   Overall Financial Resource Strain (CARDIA)    Difficulty of Paying Living Expenses: Not hard at all  Food Insecurity: No Food Insecurity (06/11/2024)   Hunger Vital Sign    Worried About Running Out of Food in the Last Year: Never true    Ran Out of Food in the Last Year: Never true  Transportation Needs: No Transportation Needs (06/11/2024)   PRAPARE - Administrator, Civil Service (Medical): No    Lack of Transportation (Non-Medical): No  Physical Activity: Insufficiently Active (01/14/2024)   Exercise Vital Sign    Days of Exercise per Week: 3 days    Minutes of Exercise per Session: 30 min  Stress: No Stress Concern Present (01/14/2024)   [employer] of Occupational Health - Occupational Stress Questionnaire    Feeling of Stress : Not at all  Social Connections: Moderately Integrated (06/11/2024)   Social Connection and Isolation Panel    Frequency of Communication with Friends and Family: More than three times a week    Frequency of Social Gatherings with Friends and Family: More than three times a week    Attends Religious Services: More than 4 times per year    Active Member of Clubs or  Organizations: No    Attends Banker Meetings: Not on file    Marital Status: Married   Intimate Partner Violence: Not At Risk (06/11/2024)   Humiliation, Afraid, Rape, and Kick questionnaire    Fear of Current or Ex-Partner: No    Emotionally Abused: No    Physically Abused: No    Sexually Abused: No    Family History:   Family History  Problem Relation Age of Onset   Rheum arthritis Mother    Hypertension Mother    Rheum arthritis Sister      ROS:  Please see the history of present illness.  All other ROS reviewed and negative.     Physical Exam/Data: Vitals:   06/11/24 0445 06/11/24 0500 06/11/24 0515 06/11/24 0522  BP: (!) 99/58 96/61 108/64 (!) 139/95  Pulse:    88  Resp: 18 16 20 18   Temp:    98.2 F (36.8 C)  TempSrc:    Oral  SpO2:    95%  Weight:    135.1 kg  Height:    6' (1.829 m)   No intake or output data in the 24 hours ending 06/11/24 0726    06/11/2024    5:22 AM 05/27/2024    7:29 AM 05/26/2024    8:21 AM  Last 3 Weights  Weight (lbs) 297 lb 13.5 oz 300 lb 304 lb 7.3 oz  Weight (kg) 135.1 kg 136.079 kg 138.1 kg     Body mass index is 40.39 kg/m.  General:  Well nourished, well developed, in no acute distress HEENT: normal Neck: no JVD Vascular: No carotid bruits; Distal pulses 2+ bilaterally Cardiac:  normal S1, S2; RRR; 2+ holosystolic murmur  Lungs:  clear to auscultation bilaterally, no wheezing, rhonchi or rales  Abd: soft, nontender, no hepatomegaly  Ext: no edema Musculoskeletal:  No deformities, BUE and BLE strength normal and equal Skin: warm and dry  Neuro:  CNs 2-12 intact, no focal abnormalities noted Psych:  Normal affect   EKG:  The EKG was personally reviewed and demonstrates:  04:45 hrs:  NSR; non-specific T-wave changes Telemetry:  Telemetry was personally reviewed and demonstrates:  Normal sinus rhythm  Relevant CV Studies: None  Laboratory Data: High Sensitivity Troponin:  No results for input(s): TROPONINIHS in the last 720 hours.   Chemistry Recent Labs  Lab 06/11/24 0126  NA 140  K 3.7  CL 104   CO2 23  GLUCOSE 125*  BUN 12  CREATININE 0.73  CALCIUM  9.6  GFRNONAA >60  ANIONGAP 13    No results for input(s): PROT, ALBUMIN, AST, ALT, ALKPHOS, BILITOT in the last 168 hours. Lipids No results for input(s): CHOL, TRIG, HDL, LABVLDL, LDLCALC, CHOLHDL in the last 168 hours.  Hematology Recent Labs  Lab 06/11/24 0126  WBC 11.2*  RBC 4.69  HGB 13.9  HCT 39.7  MCV 84.6  MCH 29.6  MCHC 35.0  RDW 13.5  PLT 241   Thyroid  No results for input(s): TSH, FREET4 in the last 168 hours.  BNPNo results for input(s): BNP, PROBNP in the last 168 hours.  DDimer No results for input(s): DDIMER in the last 168 hours.  Radiology/Studies:  CT Angio Chest PE W/Cm &/Or Wo Cm Result Date: 06/11/2024 EXAM: CTA of the Chest without and with IV contrast for PE 06/11/2024 02:48:16 AM TECHNIQUE: CTA of the chest was performed after the administration of intravenous contrast. Multiplanar reformatted images are provided for review. MIP images are provided for review. Automated exposure  control, iterative reconstruction, and/or weight based adjustment of the mA/kV was utilized to reduce the radiation dose to as low as reasonably achievable. COMPARISON: Chest radiograph earlier today. CLINICAL HISTORY: Pulmonary embolism (PE) suspected, high prob. Pt reports all of the sudden heaviness and tightness in the center of his chest that woke him up out of his sleep. Associated with SOB, dizzy, and diaphoretic. FINDINGS: PULMONARY ARTERIES: Pulmonary arteries are adequately opacified for evaluation. No evidence of pulmonary embolism. MEDIASTINUM: The heart and pericardium demonstrate no acute abnormality. Although not tailored for evaluation of the thoracic aorta, there is no evidence of thoracic aneurysm or dissection. LYMPH NODES: No mediastinal, hilar or axillary lymphadenopathy. LUNGS AND PLEURA: Mild dependent atelectasis in the bilateral upper and lower lobes. No focal consolidation  or pulmonary edema. No pleural effusion or pneumothorax. UPPER ABDOMEN: Limited images of the upper abdomen are unremarkable. SOFT TISSUES AND BONES: Mild degenerative changes in the mid to lower thoracic spine. No acute bone or soft tissue abnormality. IMPRESSION: 1. No evidence of pulmonary embolism. 2. Scattered atelectasis. 3. No acute cardiopulmonary abnormality. Electronically signed by: Pinkie Pebbles MD 06/11/2024 02:56 AM EDT RP Workstation: [id number]   DG Chest Portable 1 View Result Date: 06/11/2024 EXAM: 1 VIEW XRAY OF THE CHEST 06/11/2024 12:38:00 AM COMPARISON: 12/14/2022 CLINICAL HISTORY: CP. all of the sudden heaviness and tightness in the center of his chest that woke him up out of his sleep. Associated with SOB, dizzy, and diaphoretic. FINDINGS: LUNGS AND PLEURA: Mild left basilar opacity, likely atelectasis. No pulmonary edema. No pleural effusion. No pneumothorax. HEART AND MEDIASTINUM: No acute abnormality of the cardiac and mediastinal silhouettes. BONES AND SOFT TISSUES: No acute osseous abnormality. IMPRESSION: 1. No acute process. 2. Mild left basilar opacity, likely atelectasis. Electronically signed by: Pinkie Pebbles MD 06/11/2024 12:45 AM EDT RP Workstation: [id number]     Assessment and Plan: Chest pain: Patient presents with chest pain of unclear etiology at this time in the setting of negative HS-troponin levels and normal chest CT angiography.  Patient is now chest pain free and hemodynamically.  HS-troponin levels x2 normal.  ECG without signs of ischemia/infarction.  He has only received aspirin  at this time.  Continue telemetry given reports of heart racing at the time of his event.   --Reasonable to arrange for outpatient stress test at this time. --Echocardiogram has been ordered and team to follow up rule out high-risk features that would warrant further investigation with coronary angiography.   --Check lipid panel and continue atorvastatin .     Hypertension: Known primary hypertension without overt end-organ damage.  Blood pressure at this time is controlled.  On amlodipine  at home. --Restart home amlodipine .    Risk Assessment/Risk Scores:    TIMI Risk Score for Unstable Angina or Non-ST Elevation MI:   The patient's TIMI risk score is  , which indicates a  % risk of all cause mortality, new or recurrent myocardial infarction or need for urgent revascularization in the next 14 days.         For questions or updates, please contact [facility] Please consult [email or web address] for contact info under    Signed, Lue JONETTA Sauers, MD  06/11/2024 7:26 AM

## 2024-06-11 NOTE — H&P (Signed)
 History and Physical    Darrell Conway FMW:[id number] DOB: 1966-09-24 DOA: 06/11/2024  PCP: Lorren Greig PARAS, NP  Patient coming from: Home  I have personally briefly reviewed patient's old medical records in [facility]  Chief Complaint: Chest pain  HPI: Darrell Conway is a 58 y.o. male with medical history significant of hypertension, hyperlipidemia, GERD, OSA on CPAP, obesity presented here with complaining of chest pain that started 11:40 PM while he was sleeping.  Patient reports that he started having sudden chest heaviness/pressure, tightness in the center of his chest associated with shortness of breath, dizziness and diaphoresis.  His wife brought him to the ER for further eval and management.  No prior history of CAD.  Complaining of sore throat and difficulty swallowing.  No fever, chills, cough, congestion, wheezing, recent sick contact.  Lives with his wife at home.  No history of tobacco abuse, alcohol abuse, licit drug use.  Compliant with his home medications and CPAP.  ED Course: Upon arrival to ED: Patient afebrile, pulse 96, RR: 19, BP 129/80, initially her oxygen saturation noted to be 9193% and placed on 2 L and then weaned off on room air.  NA: 140, K: 3.7, BUN 12, creatinine 0.73.  CBC shows leukocytosis of 11.2, H&H 13.9/39.7, platelet 241.  Troponin negative x 2, lipid panel shows LDL of 144.  Chest x-ray shows mild left basilar opacity likely atelectasis.  CT angio shows no evidence of PE.  EDP consulted cardiology.  Patient was given high-dose aspirin  in ED along with nitroglycerin .  Tried hospitalist consulted for admission for further eval and management of his chest pain.  Review of Systems: As per HPI otherwise negative.    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 His smoking use included cigarettes. He has been exposed to tobacco smoke. He has never used smokeless tobacco. He reports that he does not currently use alcohol. He reports that he does not use drugs.  Allergies  Allergen Reactions   Zestril  [Lisinopril ] Hives, Swelling and Rash    Mild lip swelling    Family History  Problem Relation Age of Onset   Rheum arthritis Mother    Hypertension Mother    Rheum arthritis Sister     Prior to Admission medications   Medication Sig Start Date End Date Taking? Authorizing Provider  amLODipine  (NORVASC ) 10 MG tablet Take 1 tablet (10 mg total) by mouth daily. 04/27/24 07/26/24 Yes Lorren, Amy J, NP  atorvastatin  (LIPITOR) 40 MG tablet Take 1 tablet (40 mg total) by mouth daily. 01/14/24 06/11/24 Yes Lorren, Amy J, NP  colchicine  0.6 MG tablet Take 1 tablet (0.6 mg total) by mouth daily. Patient taking differently: Take 0.6 mg by mouth daily as needed (gout  flare). 05/25/24  Yes Lorren, Amy J, NP  diclofenac  (VOLTAREN ) 75 MG EC tablet Take 1 tablet (75 mg total) by mouth 2 (two) times daily with a meal Patient taking differently: Take 75 mg by mouth 2 (two) times daily as needed (pain). 03/18/24  Yes   EPINEPHrine  0.3 mg/0.3 mL IJ SOAJ injection Inject 0.3 mg into the muscle as needed for anaphylaxis. 12/22/22  Yes Lorren, Amy J, NP  HYDROcodone -acetaminophen  (NORCO) 10-325 MG tablet Take 1 tablet by mouth 3 (three) times daily as needed. Patient taking differently: Take 1 tablet by mouth 3 (three) times daily. 12/25/22  Yes   Multiple Vitamins-Minerals (MULTIVITAMIN MEN 50+)  TABS Take 1 tablet by mouth daily.   Yes [provider]  pantoprazole  (PROTONIX ) 40 MG tablet Take 1 tablet (40 mg total) by mouth 2 (two) times daily before a meal. Patient taking differently: Take 40 mg by mouth daily. 01/01/24 07/30/24 Yes Lorren, Amy J, NP  sildenafil  (VIAGRA ) 50 MG tablet Take 1 tablet 1/2 hour to 1 hour prior to intercourse as needed. Limit use to 1/2 tablet or 1 tablet per 24 hours. 01/14/24  Yes Lorren, Amy J, NP  triamcinolone  cream (KENALOG ) 0.1 % Apply 1 Application topically 2 (two) times a week for 30 days Patient taking differently: Apply 1 Application topically 2 (two) times daily as needed (skin irrtation). 12/31/23  Yes   valsartan  (DIOVAN ) 80 MG tablet Take 1 tablet (80 mg total) by mouth daily. 04/08/24  Yes Lorren, Amy J, NP  Vitamin D , Ergocalciferol , (DRISDOL ) 1.25 MG (50000 UNIT) CAPS capsule Take 1 capsule (50,000 Units total) by mouth once a week. Patient taking differently: Take 50,000 Units by mouth every Saturday. 05/18/24  Yes Leron Millman, NP  amoxicillin -clavulanate (AUGMENTIN ) 875-125 MG tablet Take 1 tablet by mouth every 12 (twelve) hours. Patient not taking: Reported on 06/11/2024 05/27/24   Pamella Sharper A, DO  naloxone  (NARCAN ) nasal spray 4 mg/0.1 mL Instill 1 spray in one nostril every 3 minutes; spray 1 dose.  Alternate nostrils with each dose until help arrives Patient not taking: Reported on 06/11/2024 01/25/24     fluticasone  (FLONASE ) 50 MCG/ACT nasal spray Place 2 sprays into both nostrils daily. 01/06/20 07/29/20  Prentiss Dorothyann Maxwell, MD  orlistat  (ALLI ) 60 MG capsule Take 1 capsule (60 mg total) by mouth 3 (three) times daily with meals. 02/29/20 07/29/20  Prentiss Dorothyann Maxwell, MD    Physical Exam: Vitals:   06/11/24 0500 06/11/24 0515 06/11/24 0522 06/11/24 0754  BP: 96/61 108/64 (!) 139/95 126/75  Pulse:   88 73  Resp: 16 20 18 16   Temp:   98.2 F (36.8 C) 98.6 F (37 C)  TempSrc:   Oral Oral  SpO2:   95% 94%   Weight:   135.1 kg   Height:   6' (1.829 m)     Constitutional: NAD, calm, comfortable, obese, on room air, communicating well, wife at the bedside Eyes: PERRL, lids and conjunctivae normal ENMT: Mucous membranes are moist. Posterior pharynx clear of any exudate or lesions.Normal dentition.  Neck: normal, supple, no masses, no thyromegaly Respiratory: clear to auscultation bilaterally, no wheezing, no crackles. Normal respiratory effort. No accessory muscle use.  Cardiovascular: Regular rate and rhythm, no murmurs / rubs / gallops. No extremity edema. 2+ pedal pulses. No carotid bruits.  Abdomen: no tenderness, no masses palpated. No hepatosplenomegaly. Bowel sounds positive.  Musculoskeletal: no clubbing / cyanosis. No joint deformity upper and lower extremities. Good ROM, no contractures. Normal  muscle tone.  Skin: no rashes, lesions, ulcers. No induration Neurologic: CN 2-12 grossly intact. Sensation intact, DTR normal. Strength 5/5 in all 4.  Psychiatric: Normal judgment and insight. Alert and oriented x 3. Normal mood.    Labs on Admission: I have personally reviewed following labs and imaging studies  CBC: Recent Labs  Lab 06/11/24 0126  WBC 11.2*  HGB 13.9  HCT 39.7  MCV 84.6  PLT 241   Basic Metabolic Panel: Recent Labs  Lab 06/11/24 0126  NA 140  K 3.7  CL 104  CO2 23  GLUCOSE 125*  BUN 12  CREATININE 0.73  CALCIUM  9.6   GFR: Estimated Creatinine Clearance: 145 mL/min (by C-G formula based on SCr of 0.73 mg/dL). Liver Function Tests: No results for input(s): AST, ALT, ALKPHOS, BILITOT, PROT, ALBUMIN in the last 168 hours. No results for input(s): LIPASE, AMYLASE in the last 168 hours. No results for input(s): AMMONIA in the last 168 hours. Coagulation Profile: No results for input(s): INR, PROTIME in the last 168 hours. Cardiac Enzymes: No results for input(s): CKTOTAL, CKMB, CKMBINDEX, TROPONINI in the last 168 hours. BNP  (last 3 results) No results for input(s): PROBNP in the last 8760 hours. HbA1C: Recent Labs    06/11/24 0640  HGBA1C 5.6   CBG: No results for input(s): GLUCAP in the last 168 hours. Lipid Profile: Recent Labs    06/11/24 0640  CHOL 198  HDL 39*  LDLCALC 144*  TRIG 75  CHOLHDL 5.1   Thyroid  Function Tests: No results for input(s): TSH, T4TOTAL, FREET4, T3FREE, THYROIDAB in the last 72 hours. Anemia Panel: No results for input(s): VITAMINB12, FOLATE, FERRITIN, TIBC, IRON, RETICCTPCT in the last 72 hours. Urine analysis:    Component Value Date/Time   COLORURINE YELLOW 09/20/2020 1436   APPEARANCEUR CLEAR 09/20/2020 1436   LABSPEC 1.018 09/20/2020 1436   PHURINE 5.0 09/20/2020 1436   GLUCOSEU NEGATIVE 09/20/2020 1436   HGBUR NEGATIVE 09/20/2020 1436   BILIRUBINUR NEGATIVE 09/20/2020 1436   KETONESUR NEGATIVE 09/20/2020 1436   PROTEINUR NEGATIVE 09/20/2020 1436   NITRITE NEGATIVE 09/20/2020 1436   LEUKOCYTESUR NEGATIVE 09/20/2020 1436    Radiological Exams on Admission: CT Angio Chest PE W/Cm &/Or Wo Cm Result Date: 06/11/2024 EXAM: CTA of the Chest without and with IV contrast for PE 06/11/2024 02:48:16 AM TECHNIQUE: CTA of the chest was performed after the administration of intravenous contrast. Multiplanar reformatted images are provided for review. MIP images are provided for review. Automated exposure control, iterative reconstruction, and/or weight based adjustment of the mA/kV was utilized to reduce the radiation dose to as low as reasonably achievable. COMPARISON: Chest radiograph earlier today. CLINICAL HISTORY: Pulmonary embolism (PE) suspected, high prob. Pt reports all of the sudden heaviness and tightness in the center of his chest that woke him up out of his sleep. Associated with SOB, dizzy, and diaphoretic. FINDINGS: PULMONARY ARTERIES: Pulmonary arteries are adequately opacified for evaluation. No evidence of pulmonary embolism.  MEDIASTINUM: The heart and pericardium demonstrate no acute abnormality. Although not tailored for evaluation of the thoracic aorta, there is no evidence of thoracic aneurysm or dissection. LYMPH NODES: No mediastinal, hilar or axillary lymphadenopathy. LUNGS AND PLEURA: Mild dependent atelectasis in the bilateral upper and lower lobes. No focal consolidation or pulmonary edema. No pleural effusion or pneumothorax. UPPER ABDOMEN: Limited images of the upper abdomen are unremarkable. SOFT TISSUES AND BONES: Mild degenerative changes in the mid to lower thoracic spine. No acute bone or soft tissue abnormality. IMPRESSION:  1. No evidence of pulmonary embolism. 2. Scattered atelectasis. 3. No acute cardiopulmonary abnormality. Electronically signed by: Pinkie Pebbles MD 06/11/2024 02:56 AM EDT RP Workstation: [id number]   DG Chest Portable 1 View Result Date: 06/11/2024 EXAM: 1 VIEW XRAY OF THE CHEST 06/11/2024 12:38:00 AM COMPARISON: 12/14/2022 CLINICAL HISTORY: CP. all of the sudden heaviness and tightness in the center of his chest that woke him up out of his sleep. Associated with SOB, dizzy, and diaphoretic. FINDINGS: LUNGS AND PLEURA: Mild left basilar opacity, likely atelectasis. No pulmonary edema. No pleural effusion. No pneumothorax. HEART AND MEDIASTINUM: No acute abnormality of the cardiac and mediastinal silhouettes. BONES AND SOFT TISSUES: No acute osseous abnormality. IMPRESSION: 1. No acute process. 2. Mild left basilar opacity, likely atelectasis. Electronically signed by: Pinkie Pebbles MD 06/11/2024 12:45 AM EDT RP Workstation: [id number]    EKG: Independently reviewed.  Normal sinus rhythm.  Nonspecific T wave changes.  Assessment/Plan  Chest pain: - Unknown etiology at this time.  Troponin x 2 negative.  CTA negative for PE.  Chest x-ray negative.  EKG without sign of ischemia/infarction.  Received aspirin  in ED. - Admit patient under observation. - Cardiology on board appreciate  help.  Recommend monitor on telemetry and reasonable to arrange outpatient stress test.  Echo is ordered and is pending. - Continue aspirin  and nitro as needed.  Monitor  vitals.  Hypertension: Stable - Continue amlodipine  and irbesartan   Hypercholesteremia: Continue statin  Prediabetes: A1c is now within normal limit  Morbid obesity with BMI of 40 - Diet modification exercise and weight loss recommended  OSA on CPAP  Mild leukocytosis: Likely reactive.  Continue to monitor.  GERD: Continue PPI  DVT prophylaxis: Lovenox  Code Status: Full code Family Communication: Patient's wife present at bedside.  Plan of care discussed with patient in length and he verbalized understanding and agreed with it. Disposition Plan: Home Consults called: Cardiology Admission status: Observation   Velna JONELLE Skeeter MD [facility]  If 7PM-7AM, please contact night-coverage [email or web address]  06/11/2024, 8:42 AM

## 2024-06-11 NOTE — Discharge Summary (Signed)
 Physician Discharge Summary  Andersen Iorio FMW:[id number] DOB: 04-Dec-1965 DOA: 06/11/2024  PCP: Lorren Greig PARAS, NP  Admit date: 06/11/2024 Discharge date: 06/11/2024  Admitted From: Home Disposition: Home  Recommendations for Outpatient Follow-up:  Cardiology to schedule appointment for follow-up on 07/01/2024 Start Zetia  10 mg daily  Home Health: None Equipment/Devices: None Discharge Condition: Stable CODE STATUS: Full code Diet recommendation: Heart healthy diet  Brief/Interim Summary:   Darrell Conway is a 58 y.o. male with medical history significant of hypertension, hyperlipidemia, GERD, OSA on CPAP, obesity presented here with complaining of chest pain that started 11:40 PM while he was sleeping.   Patient reports that he started having sudden chest heaviness/pressure, tightness in the center of his chest associated with shortness of breath, dizziness and diaphoresis.  His wife brought him to the ER for further eval and management.      ED Course: Upon arrival to ED: Patient afebrile, pulse 96, RR: 19, BP 129/80, initially her oxygen saturation noted to be 9193% and placed on 2 L and then weaned off on room air.  NA: 140, K: 3.7, BUN 12, creatinine 0.73.  CBC shows leukocytosis of 11.2, H&H 13.9/39.7, platelet 241.  Troponin negative x 2, lipid panel shows LDL of 144.  Chest x-ray shows mild left basilar opacity likely atelectasis.  CT angio shows no evidence of PE.  EDP consulted cardiology.  Patient was given high-dose aspirin  in ED along with nitroglycerin .  Tried hospitalist consulted for admission for further eval and management of his chest pain.  Chest pain: -Troponin x 2 negative.  CTA negative for PE.  Chest x-ray negative.  EKG without sign of ischemia/infarction.  Received aspirin  in ED. - Echocardiogram shows preserved ejection fraction with grade 1 diastolic dysfunction.  No regional wall motion abnormalities.  Patient is chest pain-free.  Cardiology Dr. Sheena recommended  outpatient follow-up.  Recommend adding Zetia  10 mg and nitro as needed for chest pain -Patient scheduled to see cardiology outpatient on 07/01/2024.   Hypertension: Remained stable - Continued amlodipine  and irbesartan    Hypercholesteremia: Continued statin, added Zetia  due to elevated LDL   Prediabetes: A1c is now within normal limit   Morbid obesity with BMI of 40 - Diet modification exercise and weight loss recommended   OSA on CPAP   Mild leukocytosis: Likely reactive.    GERD: Continued PPI  ED: Stop sildenafil  for now until cardiology evaluation.  Discharge Diagnoses:  Chest pain Hypertension Hypercholesteremia Prediabetes Morbid obesity with BMI of 40 OSA on CPAP Mild leukocytosis GERD    Discharge Instructions  Discharge Instructions     Diet - low sodium heart healthy   Complete by: As directed    Increase activity slowly   Complete by: As directed       Allergies as of 06/11/2024       Reactions   Zestril  [lisinopril ] Hives, Swelling, Rash   Mild lip swelling        Medication List     STOP taking these medications    amoxicillin -clavulanate 875-125 MG tablet Commonly known as: AUGMENTIN    sildenafil  50 MG tablet Commonly known as: Viagra        TAKE these medications    amLODipine  10 MG tablet Commonly known as: NORVASC  Take 1 tablet (10 mg total) by mouth daily.   aspirin  EC 81 MG tablet Take 1 tablet (81 mg total) by mouth daily. Swallow whole. Start taking on: June 12, 2024   atorvastatin  40 MG tablet Commonly known as: LIPITOR Take  1 tablet (40 mg total) by mouth daily.   colchicine  0.6 MG tablet Take 1 tablet (0.6 mg total) by mouth daily. What changed:  when to take this reasons to take this   diclofenac  75 MG EC tablet Commonly known as: VOLTAREN  Take 1 tablet (75 mg total) by mouth 2 (two) times daily with a meal What changed:  when to take this reasons to take this   EPINEPHrine  0.3 mg/0.3 mL Soaj  injection Commonly known as: EPI-PEN Inject 0.3 mg into the muscle as needed for anaphylaxis.   ezetimibe  10 MG tablet Commonly known as: Zetia  Take 1 tablet (10 mg total) by mouth daily.   HYDROcodone -acetaminophen  10-325 MG tablet Commonly known as: NORCO Take 1 tablet by mouth 3 (three) times daily as needed. What changed: when to take this   Multivitamin Men 50+ Tabs Take 1 tablet by mouth daily.   naloxone  4 MG/0.1ML Liqd nasal spray kit Commonly known as: Narcan  Instill 1 spray in one nostril every 3 minutes; spray 1 dose.  Alternate nostrils with each dose until help arrives   nitroGLYCERIN  0.4 MG SL tablet Commonly known as: NITROSTAT  Place 1 tablet (0.4 mg total) under the tongue every 5 (five) minutes as needed for chest pain (Do not give more than 3 SL tablets in 15 minutes.).   pantoprazole  40 MG tablet Commonly known as: PROTONIX  Take 1 tablet (40 mg total) by mouth 2 (two) times daily before a meal. What changed: when to take this   triamcinolone  cream 0.1 % Commonly known as: KENALOG  Apply 1 Application topically 2 (two) times a week for 30 days What changed:  when to take this reasons to take this   valsartan  80 MG tablet Commonly known as: DIOVAN  Take 1 tablet (80 mg total) by mouth daily.   Vitamin D  (Ergocalciferol ) 1.25 MG (50000 UNIT) Caps capsule Commonly known as: DRISDOL  Take 1 capsule (50,000 Units total) by mouth once a week. What changed: when to take this        Follow-up Information     West, Katlyn D, NP Follow up on 07/01/2024.   Specialty: Cardiology Why: at 8:25am for your cardiology follow up appointment Contact information: [address] [place] [address] [phone]                Allergies  Allergen Reactions   Zestril  [Lisinopril ] Hives, Swelling and Rash    Mild lip swelling    Consultations: Cardiology   Procedures/Studies: ECHOCARDIOGRAM COMPLETE Result Date: 06/11/2024    ECHOCARDIOGRAM  REPORT   Patient Name:   BYRL Carton Date of Exam: 06/11/2024 Medical Rec #:  [id number]      Height:       72.0 in Accession #:    [id number]     Weight:       297.8 lb Date of Birth:  [birth year]       BSA:          2.523 m Patient Age:    58 years       BP:           126/75 mmHg Patient Gender: M              HR:           81 bpm. Exam Location:  Inpatient Procedure: 2D Echo, Cardiac Doppler and Color Doppler (Both Spectral and Color            Flow Doppler were utilized during procedure). Indications:  Chest Pain R07.9  History:        Patient has no prior history of Echocardiogram examinations.                 Angina, Signs/Symptoms:Chest Pain; Risk Factors:Hypertension and                 Sleep Apnea.  Sonographer:    Thea Norlander RCS Referring Phys: [phone] SUBRINA SUNDIL IMPRESSIONS  1. Left ventricular ejection fraction, by estimation, is 60 to 65%. The left ventricle has normal function. The left ventricle has no regional wall motion abnormalities. Left ventricular diastolic parameters are consistent with Grade I diastolic dysfunction (impaired relaxation).  2. Right ventricular systolic function is normal. The right ventricular size is mildly enlarged.  3. The mitral valve is normal in structure. No evidence of mitral valve regurgitation. No evidence of mitral stenosis.  4. The aortic valve is tricuspid. Aortic valve regurgitation is not visualized. No aortic stenosis is present.  5. The inferior vena cava is normal in size with greater than 50% respiratory variability, suggesting right atrial pressure of 3 mmHg. FINDINGS  Left Ventricle: Left ventricular ejection fraction, by estimation, is 60 to 65%. The left ventricle has normal function. The left ventricle has no regional wall motion abnormalities. The left ventricular internal cavity size was normal in size. There is  no left ventricular hypertrophy. Left ventricular diastolic parameters are consistent with Grade I diastolic dysfunction  (impaired relaxation). Right Ventricle: The right ventricular size is mildly enlarged. No increase in right ventricular wall thickness. Right ventricular systolic function is normal. Left Atrium: Left atrial size was normal in size. Right Atrium: Right atrial size was normal in size. Pericardium: There is no evidence of pericardial effusion. Mitral Valve: The mitral valve is normal in structure. No evidence of mitral valve regurgitation. No evidence of mitral valve stenosis. Tricuspid Valve: The tricuspid valve is normal in structure. Tricuspid valve regurgitation is not demonstrated. No evidence of tricuspid stenosis. Aortic Valve: The aortic valve is tricuspid. Aortic valve regurgitation is not visualized. No aortic stenosis is present. Aortic valve peak gradient measures 8.2 mmHg. Pulmonic Valve: The pulmonic valve was normal in structure. Pulmonic valve regurgitation is not visualized. No evidence of pulmonic stenosis. Aorta: The aortic root is normal in size and structure. Venous: The inferior vena cava is normal in size with greater than 50% respiratory variability, suggesting right atrial pressure of 3 mmHg. IAS/Shunts: No atrial level shunt detected by color flow Doppler.  LEFT VENTRICLE PLAX 2D LVIDd:         4.20 cm   Diastology LVIDs:         2.80 cm   LV e' medial:    9.36 cm/s LV PW:         2.10 cm   LV E/e' medial:  8.6 LV IVS:        1.00 cm   LV e' lateral:   9.72 cm/s LVOT diam:     2.40 cm   LV E/e' lateral: 8.3 LV SV:         106 LV SV Index:   42 LVOT Area:     4.52 cm  RIGHT VENTRICLE             IVC RV S prime:     19.60 cm/s  IVC diam: 2.00 cm TAPSE (M-mode): 2.9 cm LEFT ATRIUM             Index  RIGHT ATRIUM           Index LA diam:        3.70 cm 1.47 cm/m   RA Area:     17.60 cm LA Vol (A2C):   56.7 ml 22.47 ml/m  RA Volume:   47.40 ml  18.78 ml/m LA Vol (A4C):   54.6 ml 21.64 ml/m LA Biplane Vol: 55.9 ml 22.15 ml/m  AORTIC VALVE AV Area (Vmax): 4.21 cm AV Vmax:        143.00  cm/s AV Peak Grad:   8.2 mmHg LVOT Vmax:      133.00 cm/s LVOT Vmean:     84.700 cm/s LVOT VTI:       0.235 m  AORTA Ao Root diam: 3.10 cm MITRAL VALVE MV Area (PHT): 3.53 cm    SHUNTS MV Decel Time: 215 msec    Systemic VTI:  0.24 m MV E velocity: 80.70 cm/s  Systemic Diam: 2.40 cm MV A velocity: 75.50 cm/s MV E/A ratio:  1.07 Oneil Parchment MD Electronically signed by Oneil Parchment MD Signature Date/Time: 06/11/2024/2:35:17 PM    Final    CT Angio Chest PE W/Cm &/Or Wo Cm Result Date: 06/11/2024 EXAM: CTA of the Chest without and with IV contrast for PE 06/11/2024 02:48:16 AM TECHNIQUE: CTA of the chest was performed after the administration of intravenous contrast. Multiplanar reformatted images are provided for review. MIP images are provided for review. Automated exposure control, iterative reconstruction, and/or weight based adjustment of the mA/kV was utilized to reduce the radiation dose to as low as reasonably achievable. COMPARISON: Chest radiograph earlier today. CLINICAL HISTORY: Pulmonary embolism (PE) suspected, high prob. Pt reports all of the sudden heaviness and tightness in the center of his chest that woke him up out of his sleep. Associated with SOB, dizzy, and diaphoretic. FINDINGS: PULMONARY ARTERIES: Pulmonary arteries are adequately opacified for evaluation. No evidence of pulmonary embolism. MEDIASTINUM: The heart and pericardium demonstrate no acute abnormality. Although not tailored for evaluation of the thoracic aorta, there is no evidence of thoracic aneurysm or dissection. LYMPH NODES: No mediastinal, hilar or axillary lymphadenopathy. LUNGS AND PLEURA: Mild dependent atelectasis in the bilateral upper and lower lobes. No focal consolidation or pulmonary edema. No pleural effusion or pneumothorax. UPPER ABDOMEN: Limited images of the upper abdomen are unremarkable. SOFT TISSUES AND BONES: Mild degenerative changes in the mid to lower thoracic spine. No acute bone or soft tissue  abnormality. IMPRESSION: 1. No evidence of pulmonary embolism. 2. Scattered atelectasis. 3. No acute cardiopulmonary abnormality. Electronically signed by: Pinkie Pebbles MD 06/11/2024 02:56 AM EDT RP Workstation: [id number]   DG Chest Portable 1 View Result Date: 06/11/2024 EXAM: 1 VIEW XRAY OF THE CHEST 06/11/2024 12:38:00 AM COMPARISON: 12/14/2022 CLINICAL HISTORY: CP. all of the sudden heaviness and tightness in the center of his chest that woke him up out of his sleep. Associated with SOB, dizzy, and diaphoretic. FINDINGS: LUNGS AND PLEURA: Mild left basilar opacity, likely atelectasis. No pulmonary edema. No pleural effusion. No pneumothorax. HEART AND MEDIASTINUM: No acute abnormality of the cardiac and mediastinal silhouettes. BONES AND SOFT TISSUES: No acute osseous abnormality. IMPRESSION: 1. No acute process. 2. Mild left basilar opacity, likely atelectasis. Electronically signed by: Pinkie Pebbles MD 06/11/2024 12:45 AM EDT RP Workstation: [id number]      Subjective: Patient seen and examined.  Denies any chest pain, shortness of breath.  Wife at the right side.  Per RN patient is comfortable going home.  Discharge Exam: Vitals:  06/11/24 0754 06/11/24 1153  BP: 126/75 130/82  Pulse: 73 80  Resp: 16 16  Temp: 98.6 F (37 C) 98.5 F (36.9 C)  SpO2: 94% 94%   Vitals:   06/11/24 0515 06/11/24 0522 06/11/24 0754 06/11/24 1153  BP: 108/64 (!) 139/95 126/75 130/82  Pulse:  88 73 80  Resp: 20 18 16 16   Temp:  98.2 F (36.8 C) 98.6 F (37 C) 98.5 F (36.9 C)  TempSrc:  Oral Oral Oral  SpO2:  95% 94% 94%  Weight:  135.1 kg    Height:  6' (1.829 m)     General: Pt is alert, awake, not in acute distress, on room air, communicating well Cardiovascular: RRR, S1/S2 +, no rubs, no gallops Respiratory: CTA bilaterally, no wheezing, no rhonchi Abdominal: Soft, NT, ND, bowel sounds + Extremities: no edema, no cyanosis    The results of significant diagnostics from this  hospitalization (including imaging, microbiology, ancillary and laboratory) are listed below for reference.     Microbiology: No results found for this or any previous visit (from the past 240 hours).   Labs: BNP (last 3 results) No results for input(s): BNP in the last 8760 hours. Basic Metabolic Panel: Recent Labs  Lab 06/11/24 0126 06/11/24 0833  NA 140 139  K 3.7 4.1  CL 104 104  CO2 23 23  GLUCOSE 125* 102*  BUN 12 9  CREATININE 0.73 0.65  CALCIUM  9.6 9.3   Liver Function Tests: No results for input(s): AST, ALT, ALKPHOS, BILITOT, PROT, ALBUMIN in the last 168 hours. No results for input(s): LIPASE, AMYLASE in the last 168 hours. No results for input(s): AMMONIA in the last 168 hours. CBC: Recent Labs  Lab 06/11/24 0126 06/11/24 0833  WBC 11.2* 10.0  HGB 13.9 13.9  HCT 39.7 40.9  MCV 84.6 85.7  PLT 241 248   Cardiac Enzymes: No results for input(s): CKTOTAL, CKMB, CKMBINDEX, TROPONINI in the last 168 hours. BNP: Invalid input(s): POCBNP CBG: No results for input(s): GLUCAP in the last 168 hours. D-Dimer No results for input(s): DDIMER in the last 72 hours. Hgb A1c Recent Labs    06/11/24 0640  HGBA1C 5.6   Lipid Profile Recent Labs    06/11/24 0640  CHOL 198  HDL 39*  LDLCALC 144*  TRIG 75  CHOLHDL 5.1   Thyroid  function studies No results for input(s): TSH, T4TOTAL, T3FREE, THYROIDAB in the last 72 hours.  Invalid input(s): FREET3 Anemia work up No results for input(s): VITAMINB12, FOLATE, FERRITIN, TIBC, IRON, RETICCTPCT in the last 72 hours. Urinalysis    Component Value Date/Time   COLORURINE YELLOW 09/20/2020 1436   APPEARANCEUR CLEAR 09/20/2020 1436   LABSPEC 1.018 09/20/2020 1436   PHURINE 5.0 09/20/2020 1436   GLUCOSEU NEGATIVE 09/20/2020 1436   HGBUR NEGATIVE 09/20/2020 1436   BILIRUBINUR NEGATIVE 09/20/2020 1436   KETONESUR NEGATIVE 09/20/2020 1436   PROTEINUR NEGATIVE  09/20/2020 1436   NITRITE NEGATIVE 09/20/2020 1436   LEUKOCYTESUR NEGATIVE 09/20/2020 1436   Sepsis Labs Recent Labs  Lab 06/11/24 0126 06/11/24 0833  WBC 11.2* 10.0   Microbiology No results found for this or any previous visit (from the past 240 hours).   Time coordinating discharge: Over 30 minutes  SIGNED:   Velna JONELLE Skeeter, MD  [facility] 06/11/2024, 4:05 PM Pager   If 7PM-7AM, please contact night-coverage [email or web address]

## 2024-06-11 NOTE — ED Notes (Signed)
 VSS on 2L O2 via nasal canula. Denies SOB at this time. Continues to voice concerns for ongoing chest discomfort that feels heavy. Denies any cardiopulmonary history with the exception of OSA for which he uses CPAP at home with an under-the-nose full face mask apparatus. NAD noted.

## 2024-06-11 NOTE — Plan of Care (Addendum)
 Transferred from [facility] emergency department to [facility] observation floor:  58 year old man history of essential hypertension, obstructive sleep apnea, morbid obesity, hyperlipidemia, and hyperlipidemia who has been presented to to emergency department with complaining of sudden onset of left-sided chest chest tightness and heaviness that woke up from him sleep with associated shortness of breath dizziness and diaphoresis.  Patient reports that symptom has been improved however still complaining about some left-sided chest pressure and which eventually improved after giving nitroglycerin  in the ED.   At presentation to ED patient requiring oxygen currently O2 sat 91 to 93% on 2 L otherwise hemodynamically stable. EKG showed normal sinus rhythm heart rate 73, borderline prolonged PR interval, and borderline T wave abnormality in inferior leads. Troponin x 2 within normal range and flat troponin.  CBC showing leukocytosis 11.2.  BMP unremarkable.  CTA chest no evidence of PE.  Scattered atelectasis.  No acute cardiopulmonary process. Chest x-ray no acute disease process.  Left basilar atelectasis.  After giving the nitroglycerin  patient is chest pain-free and patient also received aspirin  325 mg..Patient HERAT score is 5.  Hospitalist has been consulted for further evaluation and management of left-sided chest pain and inpatient workup for stress test.  Requested Dr. Roselyn to consult cardiology to evaluate the patient and cardiology need to order the stress test as Internal medicine team does not have the privilege to order the stress test.  I have ordered echocardiogram.  Luz Mares, MD [facility] 06/11/2024, 4:30 AM

## 2024-06-11 NOTE — Progress Notes (Signed)
 Lab at bedside for lab draw of A1C and lipid panel.

## 2024-06-11 NOTE — Progress Notes (Signed)
 Echocardiogram 2D Echocardiogram has been performed.  Darrell Conway 06/11/2024, 2:09 PM

## 2024-06-11 NOTE — ED Notes (Signed)
 3 light green tops sent with blood work.

## 2024-06-11 NOTE — ED Provider Notes (Signed)
 [place] EMERGENCY DEPARTMENT AT [facility]  Provider Note  CSN: [id number] Arrival date & time: 06/11/24 0008  History Chief Complaint  Patient presents with   Chest Pain    Darrell Conway is a 58 y.o. male with history of HTN, HLD, no DM, no tobacco, no known CAD brought by wife for evaluation of sudden onset of chest pain around 2345hrs tonight, woke him from sleep. Associated with diaphoresis and SOB. Improved some since onset but still some pressure in left chest. No recent travel. No significant leg swelling but he reports a cramp in his R calf a few days ago. He was recently treated for an ear infection but otherwise has been in his usual state of health.    Home Medications Prior to Admission medications   Medication Sig Start Date End Date Taking? Authorizing Provider  acetaminophen  (TYLENOL ) 325 MG tablet Take 1-2 tablets (325-650 mg total) by mouth every 6 (six) hours as needed for mild pain (pain score 1-3 or temp > 100.5). 08/02/21   Patti Rosina SAUNDERS, PA-C  amLODipine  (NORVASC ) 10 MG tablet Take 1 tablet (10 mg total) by mouth daily. 04/27/24 07/26/24  Lorren Greig PARAS, NP  amoxicillin -clavulanate (AUGMENTIN ) 875-125 MG tablet Take 1 tablet by mouth every 12 (twelve) hours. 05/27/24   Pamella Ozell LABOR, DO  atorvastatin  (LIPITOR) 40 MG tablet Take 1 tablet (40 mg total) by mouth daily. 01/14/24 04/13/24  Lorren Greig PARAS, NP  azithromycin  (ZITHROMAX ) 250 MG tablet Take 2 tablets (500 mg total) by mouth daily for 1 day. if not better, follow up with your doctor 12/29/23     clotrimazole -betamethasone  (LOTRISONE ) cream Use on affected area 2 times daily until resolution and then 1 more week 12/29/23     colchicine  0.6 MG tablet Take 1 tablet (0.6 mg total) by mouth daily. 05/25/24   Lorren Greig PARAS, NP  cyclobenzaprine  (FLEXERIL ) 10 MG tablet Take 1 tablet (10 mg total) by mouth every 8 (eight) hours as needed for up to 10 days. 08/26/23     diclofenac  (VOLTAREN ) 75 MG EC tablet Take 1  tablet (75 mg total) by mouth 2 (two) times daily with a meal 03/18/24     EPINEPHrine  0.3 mg/0.3 mL IJ SOAJ injection Inject 0.3 mg into the muscle as needed for anaphylaxis. 12/22/22   Lorren Greig PARAS, NP  ergocalciferol  (VITAMIN D2) 1.25 MG (50000 UT) capsule Take 1 capsule (50,000 Units total) by mouth once a week. 02/01/24     gabapentin  (NEURONTIN ) 300 MG capsule Take by mouth. 03/21/19   [provider]  HYDROcodone -acetaminophen  (NORCO) 10-325 MG tablet Take 1 tablet by mouth 3 (three) times daily as needed. 12/25/22     HYDROcodone -acetaminophen  (NORCO) 10-325 MG tablet Take 1 tablet by mouth 3 (three) times daily as needed. 01/23/23     HYDROcodone -acetaminophen  (NORCO) 10-325 MG tablet Take 1 tablet by mouth 3 (three) times daily as needed. 09/22/23     HYDROcodone -acetaminophen  (NORCO) 10-325 MG tablet Take 1 tablet by mouth 3 (three) times daily as needed. 11/27/23     HYDROcodone -acetaminophen  (NORCO) 10-325 MG tablet Take 1 Tablet by mouth three times daily as needed 12/28/23     HYDROcodone -acetaminophen  (NORCO) 10-325 MG tablet Take 1 tablet by mouth 3 (three) times daily as needed for pain 02/22/24     HYDROcodone -acetaminophen  (NORCO) 10-325 MG tablet Take 1 tablet by mouth 3 (three) times daily as needed for pain. 04/20/24     HYDROcodone -acetaminophen  (NORCO) 10-325 MG tablet Take 1  tablet by mouth 3 (three) times daily as needed for pain 05/18/24     HYDROcodone -acetaminophen  (NORCO/VICODIN) 5-325 MG tablet Take by mouth. 03/17/16   [provider]  lisinopril  (ZESTRIL ) 20 MG tablet Take by mouth. 03/21/19   [provider]  meloxicam  (MOBIC ) 15 MG tablet Take 1 tablet (15 mg total) by mouth daily. 07/24/23     Multiple Vitamin (MULTIVITAMIN) capsule Take 1 capsule by mouth daily.    [provider]  naloxone  (NARCAN ) nasal spray 4 mg/0.1 mL Instill 1 spray in one nostril every 3 minutes; spray 1 dose.  Alternate nostrils with each dose until help arrives  01/25/24     naloxone  (NARCAN ) nasal spray 4 mg/0.1 mL Use 1 spray every 3 minutes; spray 1 dose into ONE nostril; alternate nostrils with each dose until help arrives 02/22/24     pantoprazole  (PROTONIX ) 40 MG tablet Take 1 tablet (40 mg total) by mouth 2 (two) times daily before a meal. 01/01/24 03/31/24  Lorren Greig PARAS, NP  predniSONE  (DELTASONE ) 20 MG tablet Take 1 tablet (20 mg total) by mouth daily for 5 days 12/30/23     predniSONE  (STERAPRED UNI-PAK 21 TAB) 5 MG (21) TBPK tablet Take according to package instructions. 10/15/23   Donah Riis A, PA-C  sildenafil  (VIAGRA ) 50 MG tablet Take 1 tablet 1/2 hour to 1 hour prior to intercourse as needed. Limit use to 1/2 tablet or 1 tablet per 24 hours. 01/14/24   Lorren Greig PARAS, NP  triamcinolone  cream (KENALOG ) 0.1 % Apply 1 Application topically 2 (two) times a week for 30 days 12/31/23     valACYclovir  (VALTREX ) 1000 MG tablet Take 3 tablets (3,000 mg total) by mouth daily for 7 days 12/30/23     valsartan  (DIOVAN ) 80 MG tablet Take 1 tablet (80 mg total) by mouth daily. 04/08/24   Lorren Greig PARAS, NP  Vitamin D , Ergocalciferol , (DRISDOL ) 1.25 MG (50000 UNIT) CAPS capsule Take 1 capsule (50,000 Units total) by mouth once a week. 03/22/24   Jerrye Lamar CHRISTELLA Mickey., MD  Vitamin D , Ergocalciferol , (DRISDOL ) 1.25 MG (50000 UNIT) CAPS capsule Take 1 capsule (50,000 Units total) by mouth once a week. 04/20/24     Vitamin D , Ergocalciferol , (DRISDOL ) 1.25 MG (50000 UNIT) CAPS capsule Take 1 capsule (50,000 Units total) by mouth once a week. 05/18/24   Leron Millman, NP  fluticasone  (FLONASE ) 50 MCG/ACT nasal spray Place 2 sprays into both nostrils daily. 01/06/20 07/29/20  Prentiss Dorothyann Maxwell, MD  orlistat  (ALLI ) 60 MG capsule Take 1 capsule (60 mg total) by mouth 3 (three) times daily with meals. 02/29/20 07/29/20  Prentiss Dorothyann Maxwell, MD     Allergies    Lisinopril    Review of Systems   Review of Systems Please see HPI for pertinent positives and  negatives  Physical Exam BP 124/80   Pulse 78   Temp 98.2 F (36.8 C) (Oral)   Resp 16   SpO2 95%   Physical Exam Vitals and nursing note reviewed.  Constitutional:      Appearance: Normal appearance.  HENT:     Head: Normocephalic and atraumatic.     Nose: Nose normal.     Mouth/Throat:     Mouth: Mucous membranes are moist.  Eyes:     Extraocular Movements: Extraocular movements intact.     Conjunctiva/sclera: Conjunctivae normal.  Cardiovascular:     Rate and Rhythm: Normal rate.  Pulmonary:     Effort: Pulmonary effort is normal.  Breath sounds: Normal breath sounds.  Chest:     Chest wall: No tenderness.  Abdominal:     General: Abdomen is flat.     Palpations: Abdomen is soft.     Tenderness: There is no abdominal tenderness.  Musculoskeletal:        General: No swelling. Normal range of motion.     Cervical back: Neck supple.     Right lower leg: No tenderness. No edema.     Left lower leg: No tenderness. No edema.  Skin:    General: Skin is warm and dry.  Neurological:     General: No focal deficit present.     Mental Status: He is alert.  Psychiatric:        Mood and Affect: Mood normal.     ED Results / Procedures / Treatments   EKG EKG Interpretation Date/Time:  Saturday June 11 2024 04:05:13 EDT Ventricular Rate:  73 PR Interval:  204 QRS Duration:  97 QT Interval:  378 QTC Calculation: 417 R Axis:   31  Text Interpretation: Sinus rhythm Borderline prolonged PR interval RSR' in V1 or V2, right VCD or RVH Borderline T abnormalities, inferior leads No significant change since last tracing Confirmed by Roselyn Dunnings [phone]) on 06/11/2024 4:07:53 AM  Procedures Procedures  Medications Ordered in the ED Medications  nitroGLYCERIN  (NITROSTAT ) SL tablet 0.4 mg (0.4 mg Sublingual Given 06/11/24 0056)  acetaminophen  (TYLENOL ) tablet 650 mg (has no administration in time range)  aspirin  EC tablet 81 mg (has no administration in time range)   aspirin  chewable tablet 324 mg (324 mg Oral Given 06/11/24 0056)  iohexol  (OMNIPAQUE ) 350 MG/ML injection 100 mL (75 mLs Intravenous Contrast Given 06/11/24 0248)    Initial Impression and Plan  Patient here with sudden onset of chest pain, concerning for ACS vs PE. Also consider GI source, PTX, or anxiety. Will check labs, CXR, send for CT given borderline hypoxia on arrival. ASA and NTG ordered.   ED Course   Clinical Course as of 06/11/24 0434  Sat Jun 11, 2024  0048 I personally viewed the images from radiology studies and agree with radiologist interpretation: CXR without acute process [CS]  0146 CBC is unremarkable.  [CS]  0236 BMP and initial Trop are negative.  [CS]  0301 I personally viewed the images from radiology studies and agree with radiologist interpretation: CTA neg for PE or other acute cause of his symptoms.  [CS]  0358 Repeat Trop remains normal. Patient reported chest pain resolution with NTG. Will repeat EKG. APAP for headache.  [CS]  0413 Patient is resting comfortably now. Repeat EKG is unchanged. HEART score is 5, moderate risk for MACE. I discussed admission with patient and wife and they are amenable.  [CS]  0430 Spoke with Dr. Sundil, Hospitalist, who will accept for admission. [CS]    Clinical Course User Index [CS] Roselyn Dunnings NOVAK, MD     MDM Rules/Calculators/A&P Medical Decision Making Problems Addressed: Unstable angina [facility]): acute illness or injury  Amount and/or Complexity of Data Reviewed Labs: ordered. Decision-making details documented in ED Course. Radiology: ordered and independent interpretation performed. Decision-making details documented in ED Course. ECG/medicine tests: ordered and independent interpretation performed. Decision-making details documented in ED Course.  Risk OTC drugs. Prescription drug management. Decision regarding hospitalization.     Final Clinical Impression(s) / ED Diagnoses Final diagnoses:  Unstable  angina (HCC)    Rx / DC Orders ED Discharge Orders     None  Roselyn Carlin NOVAK, MD 06/11/24 [phone]

## 2024-06-13 ENCOUNTER — Ambulatory Visit: Payer: Self-pay | Admitting: Family

## 2024-06-13 ENCOUNTER — Telehealth: Payer: Self-pay

## 2024-06-13 NOTE — Transitions of Care (Post Inpatient/ED Visit) (Signed)
 06/13/2024  Name: Darrell Conway MRN: [id number] DOB: [birth year]  Today's TOC FU Call Status: Today's TOC FU Call Status:: Successful TOC FU Call Completed TOC FU Call Complete Date: 06/13/24 Patient's Name and Date of Birth confirmed.  Transition Care Management Follow-up Telephone Call Date of Discharge: 06/11/24 Discharge Facility: [facility]) Type of Discharge: Inpatient Admission Primary Inpatient Discharge Diagnosis:: unstable angina How have you been since you were released from the hospital?: Better (He stated he feels great.) Any questions or concerns?: No  Items Reviewed: Did you receive and understand the discharge instructions provided?: Yes Medications obtained,verified, and reconciled?: Yes (Medications Reviewed) (He stated he has all medications except the narcan  which he said he doesn't need.) Any new allergies since your discharge?: No Dietary orders reviewed?: Yes Type of Diet Ordered:: heart healthy, low sodium Do you have support at home?: Yes People in Home [RPT]: spouse Name of Support/Comfort Primary Source: his wife  Medications Reviewed Today: Medications Reviewed Today     Reviewed by Marvis Bradley, RN (Case Manager) on 06/13/24 at 1218  Med List Status: <None>   Medication Order Taking? Sig Documenting Provider Last Dose Status Informant  amLODipine  (NORVASC ) 10 MG tablet [id number] No Take 1 tablet (10 mg total) by mouth daily. Lorren Greig PARAS, NP 06/10/2024 Morning Active Self, Pharmacy Records, Spouse/Significant Other  aspirin  EC 81 MG tablet [id number]  Take 1 tablet (81 mg total) by mouth daily. Swallow whole. Pahwani, Velna SAUNDERS, MD  Active   atorvastatin  (LIPITOR) 40 MG tablet [id number] No Take 1 tablet (40 mg total) by mouth daily. Lorren Greig PARAS, NP 06/10/2024 Morning Expired 06/11/24 2359 Self, Pharmacy Records, Spouse/Significant Other           Med Note (COFFELL, ANGELA M   Sat Jun 11, 2024  8:05 AM) Per dispense report, LF 02/09/2024 #30, 30  DS. Pt insists compliance and states this medication has been filled more recently at [facility]. Unable to find a more recent fill.  colchicine  0.6 MG tablet [id number] No Take 1 tablet (0.6 mg total) by mouth daily.  Patient taking differently: Take 0.6 mg by mouth daily as needed (gout flare).   Lorren Greig PARAS, NP Past Month Active Self, Pharmacy Records, Spouse/Significant Other  diclofenac  (VOLTAREN ) 75 MG EC tablet [id number] No Take 1 tablet (75 mg total) by mouth 2 (two) times daily with a meal  Patient taking differently: Take 75 mg by mouth 2 (two) times daily as needed (pain).    Past Month Active Self, Pharmacy Records, Spouse/Significant Other  EPINEPHrine  0.3 mg/0.3 mL IJ SOAJ injection [id number] No Inject 0.3 mg into the muscle as needed for anaphylaxis. Lorren Greig PARAS, NP Taking Active Self, Pharmacy Records, Spouse/Significant Other           Med Note (COFFELL, JON HERO   Sat Jun 11, 2024  8:08 AM) Has available, never used.  ezetimibe  (ZETIA ) 10 MG tablet [id number]  Take 1 tablet (10 mg total) by mouth daily. Vernon Velna SAUNDERS, MD  Active   Discontinued 07/29/20 0948   HYDROcodone -acetaminophen  (NORCO) 10-325 MG tablet [id number] No Take 1 tablet by mouth 3 (three) times daily as needed.  Patient taking differently: Take 1 tablet by mouth 3 (three) times daily.    06/10/2024 Morning Active Self, Pharmacy Records, Spouse/Significant Other  Multiple Vitamins-Minerals (MULTIVITAMIN MEN 50+) TABS [id number] No Take 1 tablet by mouth daily. [provider] 06/10/2024 Morning Active Self, Pharmacy Records, Spouse/Significant Other  naloxone  (NARCAN ) nasal spray 4 mg/0.1 mL  [id number] No Instill 1 spray in one nostril every 3 minutes; spray 1 dose.  Alternate nostrils with each dose until help arrives  Patient not taking: Reported on 06/11/2024    Not Taking Active Self, Pharmacy Records, Spouse/Significant Other           Med Note (COFFELL, ANGELA M   Sat Jun 11, 2024  8:10 AM) Never  filled. Removal from PTA list with discontinue the order at pharmacy.  nitroGLYCERIN  (NITROSTAT ) 0.4 MG SL tablet [id number]  Place 1 tablet (0.4 mg total) under the tongue every 5 (five) minutes as needed for chest pain (Do not give more than 3 SL tablets in 15 minutes.). Vernon Velna SAUNDERS, MD  Active   Discontinued 07/29/20 0948   pantoprazole  (PROTONIX ) 40 MG tablet [id number] No Take 1 tablet (40 mg total) by mouth 2 (two) times daily before a meal.  Patient taking differently: Take 40 mg by mouth daily.   Lorren Greig PARAS, NP 06/10/2024 Morning Active Self, Pharmacy Records, Spouse/Significant Other  triamcinolone  cream (KENALOG ) 0.1 % [id number] No Apply 1 Application topically 2 (two) times a week for 30 days  Patient taking differently: Apply 1 Application topically 2 (two) times daily as needed (skin irrtation).    Unknown Active Self, Pharmacy Records, Spouse/Significant Other  valsartan  (DIOVAN ) 80 MG tablet [id number] No Take 1 tablet (80 mg total) by mouth daily. Lorren Greig PARAS, NP 06/10/2024 Morning Active Self, Pharmacy Records, Spouse/Significant Other  Vitamin D , Ergocalciferol , (DRISDOL ) 1.25 MG (50000 UNIT) CAPS capsule [id number] No Take 1 capsule (50,000 Units total) by mouth once a week.  Patient taking differently: Take 50,000 Units by mouth every Saturday.   Leron Millman, NP Past Week Active Self, Pharmacy Records, Spouse/Significant Other            Home Care and Equipment/Supplies: Were Home Health Services Ordered?: No Any new equipment or medical supplies ordered?: No  Functional Questionnaire: Do you need assistance with bathing/showering or dressing?: No Do you need assistance with meal preparation?: No Do you need assistance with eating?: No Do you have difficulty maintaining continence: No Do you need assistance with getting out of bed/getting out of a chair/moving?: No Do you have difficulty managing or taking your medications?: No  Follow up appointments  reviewed: PCP Follow-up appointment confirmed?: No MD Provider Line [phone] Given: No (He hung up or the call was disconnected before this was addressed.  I called him back and had to leave a message requesting a call back) Specialist Hospital Follow-up appointment confirmed?: Yes Date of Specialist follow-up appointment?: 07/01/24 Follow-Up Specialty Provider:: cardiology Do you need transportation to your follow-up appointment?:  (not addressed before the call was disconnected/ hung up) Do you understand care options if your condition(s) worsen?:  (not addressed before the call was disconnected/ hung up)    SIGNATURE Slater Diesel, RN

## 2024-06-16 ENCOUNTER — Other Ambulatory Visit (HOSPITAL_COMMUNITY): Payer: Self-pay

## 2024-06-18 ENCOUNTER — Other Ambulatory Visit (HOSPITAL_COMMUNITY): Payer: Self-pay

## 2024-06-21 ENCOUNTER — Other Ambulatory Visit (HOSPITAL_COMMUNITY): Payer: Self-pay

## 2024-06-21 MED ORDER — HYDROCODONE-ACETAMINOPHEN 10-325 MG PO TABS
1.0000 | ORAL_TABLET | Freq: Three times a day (TID) | ORAL | 0 refills | Status: DC | PRN
  Filled 2024-06-21: qty 90, 30d supply, fill #0

## 2024-06-22 ENCOUNTER — Other Ambulatory Visit: Payer: Self-pay

## 2024-06-22 ENCOUNTER — Other Ambulatory Visit (HOSPITAL_COMMUNITY): Payer: Self-pay

## 2024-06-23 ENCOUNTER — Other Ambulatory Visit: Payer: Self-pay

## 2024-06-29 NOTE — Progress Notes (Deleted)
 Cardiology Office Note    Date:  06/29/2024  ID:  Darrell Conway, DOB 04/05/1966, MRN [id number] PCP:  Lorren Greig PARAS, NP  Cardiologist:  None  Electrophysiologist:  None   Chief Complaint: ***  History of Present Illness: .    Darrell Conway is a 58 y.o. male with visit-pertinent history of hypertension, hyperlipidemia, GERD, OSA on CPAP and remote cigarette use.   On 06/11/2024 patient presented to the ED with chest pains that had started at 11:40 PM the night before while he was sleeping.  Patient reported that he started having sudden chest heaviness/pressure, tightness in the center of her chest associated with shortness of breath, dizziness and diaphoresis.  Upon arrival to the ED heart rate was 96, BP 129/80.  CT angio showed no evidence of PE.  High sensitive troponins were negative x 2, CTA chest negative for PE, chest x-ray negative, EKG without sign of ischemia/infarction.  Echocardiogram on 06/11/2024 indicated LVEF 60 to 65%, no RWMA, G1 DD, RV systolic function was normal, RV mildly enlarged.  There were no significant valvular abnormalities.  Patient was evaluated by cardiology team, at time of evaluation he did not have any further chest pain or shortness of breath or palpitations.  EKG was reviewed which indicated T wave inversion in lead III, aVF, V5 and V6 however this was not new and present on EKG completed in January 2024 and March 2019.  Outpatient coronary CTA or cardiac PET was recommended.  Zetia  10 mg daily was added to his regimen.  Today he presents for follow-up.  He reports that he  Chest pain: Patient presented to the ED on 7/19 with chest pain that had started the night for while sleeping.  He described pain as sudden chest heaviness/pressure with associated shortness of breath, dizziness and diaphoresis.  CT angio showed no evidence of PE, high sensitive troponins were negative x 2.  Echo indicated LVEF 60 to 65%, no RWMA, G1 DD. Today he reports Coronary  CTA?  HTN: Blood pressure today   HLD: Last lipid profile on   OSA:   Labwork independently reviewed:   ROS: .   *** denies chest pain, shortness of breath, lower extremity edema, fatigue, palpitations, melena, hematuria, hemoptysis, diaphoresis, weakness, presyncope, syncope, orthopnea, and PND.  All other systems are reviewed and otherwise negative.  Studies Reviewed: SABRA    EKG:  EKG is ordered today, personally reviewed, demonstrating ***     CV Studies: Cardiac studies reviewed are outlined and summarized above. Otherwise please see EMR for full report. Cardiac Studies & Procedures   ______________________________________________________________________________________________     ECHOCARDIOGRAM  ECHOCARDIOGRAM COMPLETE 06/11/2024  Narrative ECHOCARDIOGRAM REPORT    Patient Name:   Darrell Conway Date of Exam: 06/11/2024 Medical Rec #:  [id number]      Height:       72.0 in Accession #:    [id number]     Weight:       297.8 lb Date of Birth:  27-Nov-1965       BSA:          2.523 m Patient Age:    58 years       BP:           126/75 mmHg Patient Gender: M              HR:           81 bpm. Exam Location:  Inpatient  Procedure: 2D Echo, Cardiac Doppler and Color Doppler (  Both Spectral and Color Flow Doppler were utilized during procedure).  Indications:    Chest Pain R07.9  History:        Patient has no prior history of Echocardiogram examinations. Angina, Signs/Symptoms:Chest Pain; Risk Factors:Hypertension and Sleep Apnea.  Sonographer:    Thea Norlander RCS Referring Phys: [phone] SUBRINA SUNDIL  IMPRESSIONS   1. Left ventricular ejection fraction, by estimation, is 60 to 65%. The left ventricle has normal function. The left ventricle has no regional wall motion abnormalities. Left ventricular diastolic parameters are consistent with Grade I diastolic dysfunction (impaired relaxation). 2. Right ventricular systolic function is normal. The right  ventricular size is mildly enlarged. 3. The mitral valve is normal in structure. No evidence of mitral valve regurgitation. No evidence of mitral stenosis. 4. The aortic valve is tricuspid. Aortic valve regurgitation is not visualized. No aortic stenosis is present. 5. The inferior vena cava is normal in size with greater than 50% respiratory variability, suggesting right atrial pressure of 3 mmHg.  FINDINGS Left Ventricle: Left ventricular ejection fraction, by estimation, is 60 to 65%. The left ventricle has normal function. The left ventricle has no regional wall motion abnormalities. The left ventricular internal cavity size was normal in size. There is no left ventricular hypertrophy. Left ventricular diastolic parameters are consistent with Grade I diastolic dysfunction (impaired relaxation).  Right Ventricle: The right ventricular size is mildly enlarged. No increase in right ventricular wall thickness. Right ventricular systolic function is normal.  Left Atrium: Left atrial size was normal in size.  Right Atrium: Right atrial size was normal in size.  Pericardium: There is no evidence of pericardial effusion.  Mitral Valve: The mitral valve is normal in structure. No evidence of mitral valve regurgitation. No evidence of mitral valve stenosis.  Tricuspid Valve: The tricuspid valve is normal in structure. Tricuspid valve regurgitation is not demonstrated. No evidence of tricuspid stenosis.  Aortic Valve: The aortic valve is tricuspid. Aortic valve regurgitation is not visualized. No aortic stenosis is present. Aortic valve peak gradient measures 8.2 mmHg.  Pulmonic Valve: The pulmonic valve was normal in structure. Pulmonic valve regurgitation is not visualized. No evidence of pulmonic stenosis.  Aorta: The aortic root is normal in size and structure.  Venous: The inferior vena cava is normal in size with greater than 50% respiratory variability, suggesting right atrial pressure of  3 mmHg.  IAS/Shunts: No atrial level shunt detected by color flow Doppler.   LEFT VENTRICLE PLAX 2D LVIDd:         4.20 cm   Diastology LVIDs:         2.80 cm   LV e' medial:    9.36 cm/s LV PW:         2.10 cm   LV E/e' medial:  8.6 LV IVS:        1.00 cm   LV e' lateral:   9.72 cm/s LVOT diam:     2.40 cm   LV E/e' lateral: 8.3 LV SV:         106 LV SV Index:   42 LVOT Area:     4.52 cm   RIGHT VENTRICLE             IVC RV S prime:     19.60 cm/s  IVC diam: 2.00 cm TAPSE (M-mode): 2.9 cm  LEFT ATRIUM             Index        RIGHT ATRIUM  Index LA diam:        3.70 cm 1.47 cm/m   RA Area:     17.60 cm LA Vol (A2C):   56.7 ml 22.47 ml/m  RA Volume:   47.40 ml  18.78 ml/m LA Vol (A4C):   54.6 ml 21.64 ml/m LA Biplane Vol: 55.9 ml 22.15 ml/m AORTIC VALVE AV Area (Vmax): 4.21 cm AV Vmax:        143.00 cm/s AV Peak Grad:   8.2 mmHg LVOT Vmax:      133.00 cm/s LVOT Vmean:     84.700 cm/s LVOT VTI:       0.235 m  AORTA Ao Root diam: 3.10 cm  MITRAL VALVE MV Area (PHT): 3.53 cm    SHUNTS MV Decel Time: 215 msec    Systemic VTI:  0.24 m MV E velocity: 80.70 cm/s  Systemic Diam: 2.40 cm MV A velocity: 75.50 cm/s MV E/A ratio:  1.07  Oneil Parchment MD Electronically signed by Oneil Parchment MD Signature Date/Time: 06/11/2024/2:35:17 PM    Final          ______________________________________________________________________________________________       Current Reported Medications:.    No outpatient medications have been marked as taking for the 07/01/24 encounter (Appointment) with Dequavius Kuhner D, NP.    Physical Exam:    VS:  There were no vitals taken for this visit.   Wt Readings from Last 3 Encounters:  06/11/24 297 lb 13.5 oz (135.1 kg)  05/27/24 300 lb (136.1 kg)  05/26/24 (!) 304 lb 7.3 oz (138.1 kg)    GEN: Well nourished, well developed in no acute distress NECK: No JVD; No carotid bruits CARDIAC: ***RRR, no murmurs, rubs,  gallops RESPIRATORY:  Clear to auscultation without rales, wheezing or rhonchi  ABDOMEN: Soft, non-tender, non-distended EXTREMITIES:  No edema; No acute deformity     Asessement and Plan:.     ***     Disposition: F/u with ***  Signed, Takiera Mayo D Trishia Cuthrell, NP

## 2024-07-01 ENCOUNTER — Ambulatory Visit: Admitting: Cardiology

## 2024-07-01 ENCOUNTER — Ambulatory Visit: Attending: Cardiology | Admitting: Cardiology

## 2024-07-01 DIAGNOSIS — G4733 Obstructive sleep apnea (adult) (pediatric): Secondary | ICD-10-CM

## 2024-07-01 DIAGNOSIS — E782 Mixed hyperlipidemia: Secondary | ICD-10-CM

## 2024-07-01 DIAGNOSIS — R079 Chest pain, unspecified: Secondary | ICD-10-CM

## 2024-07-01 DIAGNOSIS — I10 Essential (primary) hypertension: Secondary | ICD-10-CM

## 2024-07-19 ENCOUNTER — Other Ambulatory Visit (HOSPITAL_COMMUNITY): Payer: Self-pay

## 2024-07-19 MED ORDER — VITAMIN D (ERGOCALCIFEROL) 1.25 MG (50000 UNIT) PO CAPS
50000.0000 [IU] | ORAL_CAPSULE | ORAL | 5 refills | Status: AC
  Filled 2024-07-19: qty 4, 28d supply, fill #0

## 2024-07-19 MED ORDER — HYDROCODONE-ACETAMINOPHEN 10-325 MG PO TABS
1.0000 | ORAL_TABLET | Freq: Three times a day (TID) | ORAL | 0 refills | Status: AC | PRN
  Filled 2024-07-19: qty 90, 30d supply, fill #0

## 2024-07-20 ENCOUNTER — Ambulatory Visit (INDEPENDENT_AMBULATORY_CARE_PROVIDER_SITE_OTHER): Admitting: Family

## 2024-07-20 ENCOUNTER — Other Ambulatory Visit (HOSPITAL_COMMUNITY): Payer: Self-pay

## 2024-07-20 ENCOUNTER — Encounter: Payer: Self-pay | Admitting: Family

## 2024-07-20 VITALS — BP 135/90 | HR 78 | Temp 98.0°F | Resp 16 | Ht 72.0 in | Wt 298.8 lb

## 2024-07-20 DIAGNOSIS — K219 Gastro-esophageal reflux disease without esophagitis: Secondary | ICD-10-CM | POA: Diagnosis not present

## 2024-07-20 DIAGNOSIS — M109 Gout, unspecified: Secondary | ICD-10-CM | POA: Diagnosis not present

## 2024-07-20 DIAGNOSIS — Z23 Encounter for immunization: Secondary | ICD-10-CM | POA: Diagnosis not present

## 2024-07-20 DIAGNOSIS — E785 Hyperlipidemia, unspecified: Secondary | ICD-10-CM | POA: Diagnosis not present

## 2024-07-20 DIAGNOSIS — I10 Essential (primary) hypertension: Secondary | ICD-10-CM | POA: Diagnosis not present

## 2024-07-20 MED ORDER — PANTOPRAZOLE SODIUM 40 MG PO TBEC
40.0000 mg | DELAYED_RELEASE_TABLET | Freq: Two times a day (BID) | ORAL | 0 refills | Status: DC
  Filled 2024-07-20: qty 60, 30d supply, fill #0
  Filled 2024-09-12: qty 60, 30d supply, fill #1

## 2024-07-20 MED ORDER — ATORVASTATIN CALCIUM 40 MG PO TABS
40.0000 mg | ORAL_TABLET | Freq: Every day | ORAL | 0 refills | Status: AC
  Filled 2024-07-20: qty 30, 30d supply, fill #0
  Filled 2024-09-12: qty 30, 30d supply, fill #1

## 2024-07-20 MED ORDER — VALSARTAN 160 MG PO TABS
160.0000 mg | ORAL_TABLET | Freq: Every day | ORAL | 0 refills | Status: DC
  Filled 2024-07-20: qty 30, 30d supply, fill #0
  Filled 2024-09-12: qty 30, 30d supply, fill #1
  Filled 2024-10-25: qty 30, 30d supply, fill #2

## 2024-07-20 MED ORDER — COLCHICINE 0.6 MG PO TABS
0.6000 mg | ORAL_TABLET | Freq: Every day | ORAL | 0 refills | Status: DC
  Filled 2024-07-20: qty 30, 30d supply, fill #0
  Filled 2024-09-12: qty 30, 30d supply, fill #1

## 2024-07-20 NOTE — Progress Notes (Signed)
Pain in the left foot.

## 2024-07-20 NOTE — Progress Notes (Signed)
 Patient ID: Darrell Conway, male    DOB: 11-06-1966  MRN: [id number]  CC: Chronic Conditions Follow-Up  Subjective: Darrell Conway is a 58 y.o. male who presents for chronic conditions follow-up.   His concerns today include:  - Doing well on Valsartan , no issues/concerns. The patient does not complain of red flag symptoms such as but not limited to chest pain, shortness of breath, worst headache of life, nausea/vomiting.  - Left foot pain. Denies recent trauma/injury and red flag symptoms. States thinks he may have gout. Requests referral to Podiatry. - Patient declined labs.   Patient Active Problem List   Diagnosis Date Noted   Chest pain of uncertain etiology 06/11/2024   Unstable angina (HCC) 06/11/2024   Chest pain 06/11/2024   Neck pain 09/03/2023   Hypertensive disorder 05/23/2022   Gout 05/23/2022   Osteoarthritis of left knee 01/19/2022   Chronic pain of left ankle 12/17/2021   S/P total knee arthroplasty, right 07/30/2021   History of total knee arthroplasty 07/30/2021   Essential hypertension 01/03/2021   Osteoarthritis of right knee 11/09/2020   Pain in joint of right knee 11/09/2020   Low back pain 10/15/2020   Irregular heart beat 02/14/2020   Personal history of COVID-19 02/13/2020   Amnesia 02/13/2020   Poor concentration 02/13/2020   Muscle pain 02/13/2020   Unilateral primary osteoarthritis, left hip 01/31/2020   Osteoarthritis of left hip 01/31/2020   Morbid obesity (HCC) 06/29/2019   Primary osteoarthritis of both knees 05/05/2019   Primary osteoarthritis of both hips    Multiple joint pain 04/26/2019   Pain in left foot 04/26/2019   Pain of both hip joints 04/26/2019   Obstructive sleep apnea of adult 04/11/2019   Laryngopharyngeal reflux 04/11/2019     Current Outpatient Medications on File Prior to Visit  Medication Sig Dispense Refill   amLODipine  (NORVASC ) 10 MG tablet Take 1 tablet (10 mg total) by mouth daily. 90 tablet 0   aspirin  EC 81  MG tablet Take 1 tablet (81 mg total) by mouth daily. Swallow whole.     EPINEPHrine  0.3 mg/0.3 mL IJ SOAJ injection Inject 0.3 mg into the muscle as needed for anaphylaxis. 2 each 2   ezetimibe  (ZETIA ) 10 MG tablet Take 1 tablet (10 mg total) by mouth daily. 30 tablet 1   HYDROcodone -acetaminophen  (NORCO) 10-325 MG tablet Take 1 tablet by mouth 3 (three) times daily as needed for pain. 90 tablet 0   Multiple Vitamins-Minerals (MULTIVITAMIN MEN 50+) TABS Take 1 tablet by mouth daily.     nitroGLYCERIN  (NITROSTAT ) 0.4 MG SL tablet Place 1 tablet (0.4 mg total) under the tongue every 5 (five) minutes as needed for chest pain (Do not give more than 3 SL tablets in 15 minutes.). 60 tablet 1   triamcinolone  cream (KENALOG ) 0.1 % Apply 1 Application topically 2 (two) times a week for 30 days (Patient taking differently: Apply 1 Application topically 2 (two) times daily as needed (skin irrtation).) 45 g 2   diclofenac  (VOLTAREN ) 75 MG EC tablet Take 1 tablet (75 mg total) by mouth 2 (two) times daily with a meal (Patient taking differently: Take 75 mg by mouth 2 (two) times daily as needed (pain).) 60 tablet 2   HYDROcodone -acetaminophen  (NORCO) 10-325 MG tablet Take 1 tablet by mouth 3 (three) times daily as needed. (Patient taking differently: Take 1 tablet by mouth 3 (three) times daily.) 90 tablet 0   naloxone  (NARCAN ) nasal spray 4 mg/0.1 mL Instill 1 spray  in one nostril every 3 minutes; spray 1 dose.  Alternate nostrils with each dose until help arrives (Patient not taking: Reported on 06/11/2024) 1 each 3   Vitamin D , Ergocalciferol , (DRISDOL ) 1.25 MG (50000 UNIT) CAPS capsule Take 1 capsule (50,000 Units total) by mouth once a week. (Patient taking differently: Take 50,000 Units by mouth every Saturday.) 4 capsule 5   Vitamin D , Ergocalciferol , (DRISDOL ) 1.25 MG (50000 UNIT) CAPS capsule Take 1 capsule (50,000 Units total) by mouth once a week. (Patient not taking: Reported on 07/20/2024) 4 capsule 5    [DISCONTINUED] fluticasone  (FLONASE ) 50 MCG/ACT nasal spray Place 2 sprays into both nostrils daily. 16 g 6   [DISCONTINUED] orlistat  (ALLI ) 60 MG capsule Take 1 capsule (60 mg total) by mouth 3 (three) times daily with meals. 90 capsule 1   No current facility-administered medications on file prior to visit.    Allergies  Allergen Reactions   Zestril  [Lisinopril ] Hives, Swelling and Rash    Mild lip swelling    Social History   Socioeconomic History   Marital status: Married    Spouse name: Not on file   Number of children: Not on file   Years of education: Not on file   Highest education level: Some college, no degree  Occupational History   Occupation: Product/process development scientist  Tobacco Use   Smoking status: Former    Current packs/day: 0.00    Types: Cigarettes    Quit date: 2015    Years since quitting: 10.6    Passive exposure: Past   Smokeless tobacco: Never  Vaping Use   Vaping status: Never Used  Substance and Sexual Activity   Alcohol use: Not Currently   Drug use: No   Sexual activity: Not Currently    Birth control/protection: None  Other Topics Concern   Not on file  Social History Narrative   Not on file   Social Drivers of Health   Financial Resource Strain: Low Risk  (01/14/2024)   Overall Financial Resource Strain (CARDIA)    Difficulty of Paying Living Expenses: Not hard at all  Food Insecurity: No Food Insecurity (06/11/2024)   Hunger Vital Sign    Worried About Running Out of Food in the Last Year: Never true    Ran Out of Food in the Last Year: Never true  Transportation Needs: No Transportation Needs (06/11/2024)   PRAPARE - Administrator, Civil Service (Medical): No    Lack of Transportation (Non-Medical): No  Physical Activity: Insufficiently Active (01/14/2024)   Exercise Vital Sign    Days of Exercise per Week: 3 days    Minutes of Exercise per Session: 30 min  Stress: No Stress Concern Present (01/14/2024)   [employer] of  Occupational Health - Occupational Stress Questionnaire    Feeling of Stress : Not at all  Social Connections: Moderately Integrated (06/11/2024)   Social Connection and Isolation Panel    Frequency of Communication with Friends and Family: More than three times a week    Frequency of Social Gatherings with Friends and Family: More than three times a week    Attends Religious Services: More than 4 times per year    Active Member of [employer] or Organizations: No    Attends Banker Meetings: Not on file    Marital Status: Married  Intimate Partner Violence: Not At Risk (06/11/2024)   Humiliation, Afraid, Rape, and Kick questionnaire    Fear of Current or Ex-Partner: No  Emotionally Abused: No    Physically Abused: No    Sexually Abused: No    Family History  Problem Relation Age of Onset   Rheum arthritis Mother    Hypertension Mother    Rheum arthritis Sister     Past Surgical History:  Procedure Laterality Date   HERNIA REPAIR     TOTAL KNEE ARTHROPLASTY Right 07/30/2021   Procedure: TOTAL KNEE ARTHROPLASTY;  Surgeon: Ernie Cough, MD;  Location: [facility];  Service: Orthopedics;  Laterality: Right;    ROS: Review of Systems Negative except as stated above  PHYSICAL EXAM: BP (!) 135/90   Pulse 78   Temp 98 F (36.7 C) (Oral)   Resp 16   Ht 6' (1.829 m)   Wt 298 lb 12.8 oz (135.5 kg)   SpO2 93%   BMI 40.52 kg/m      07/20/2024    3:34 PM 07/20/2024    3:22 PM 06/11/2024   11:53 AM  Vitals with BMI  Height  6' 0   Weight  298 lbs 13 oz   BMI  40.52   Systolic 135 137 869  Diastolic 90 91 82  Pulse  78 80   Physical Exam HENT:     Head: Normocephalic and atraumatic.     Nose: Nose normal.     Mouth/Throat:     Mouth: Mucous membranes are moist.     Pharynx: Oropharynx is clear.  Eyes:     Extraocular Movements: Extraocular movements intact.     Conjunctiva/sclera: Conjunctivae normal.     Pupils: Pupils are equal, round, and reactive to light.   Cardiovascular:     Rate and Rhythm: Normal rate and regular rhythm.     Pulses: Normal pulses.     Heart sounds: Normal heart sounds.  Pulmonary:     Effort: Pulmonary effort is normal.     Breath sounds: Normal breath sounds.  Musculoskeletal:        General: Normal range of motion.     Cervical back: Normal range of motion and neck supple.     Right hip: Normal.     Left hip: Normal.     Right upper leg: Normal.     Left upper leg: Normal.     Right knee: Normal.     Left knee: Normal.     Right lower leg: Normal.     Left lower leg: Normal.     Right ankle: Normal.     Left ankle: Normal.     Right foot: Normal.     Left foot: Normal.  Neurological:     General: No focal deficit present.     Mental Status: He is alert and oriented to person, place, and time.  Psychiatric:        Mood and Affect: Mood normal.        Behavior: Behavior normal.     ASSESSMENT AND PLAN: 1. Primary hypertension (Primary) - Blood pressure not at goal during today's visit. Patient asymptomatic without chest pressure, chest pain, palpitations, shortness of breath, worst headache of life, and any additional red flag symptoms. - Increase Valsartan  from 80 mg to 160 mg as prescribed.  - Counseled on blood pressure goal of less than 130/80, low-sodium, DASH diet, medication compliance, and 150 minutes of moderate intensity exercise per week as tolerated. Counseled on medication adherence and adverse effects. - Follow-up with primary provider in 4 weeks or sooner if needed.  - valsartan  (DIOVAN ) 160 MG tablet;  Take 1 tablet (160 mg total) by mouth daily.  Dispense: 90 tablet; Refill: 0  2. Hyperlipidemia, unspecified hyperlipidemia type - Continue Atorvastatin  as prescribed. Counseled on medication adherence/adverse effects.  - Follow-up with primary provider in 3 months or sooner if needed.  - atorvastatin  (LIPITOR) 40 MG tablet; Take 1 tablet (40 mg total) by mouth daily.  Dispense: 90 tablet;  Refill: 0  3. Gastroesophageal reflux disease, unspecified whether esophagitis present - Continue Pantoprazole  as prescribed. Counseled on medication adherence/adverse effects.  - Follow-up with primary provider in 3 months or sooner if needed.  - pantoprazole  (PROTONIX ) 40 MG tablet; Take 1 tablet (40 mg total) by mouth 2 (two) times daily before a meal.  Dispense: 180 tablet; Refill: 0  4. Gout, unspecified cause, unspecified chronicity, unspecified site - Colchicine  as prescribed. Counseled on medication adherence/adverse effects.  - Referral to Podiatry for evaluation/management.  - Follow-up with primary provider as scheduled. - colchicine  0.6 MG tablet; Take 1 tablet (0.6 mg total) by mouth daily.  Dispense: 90 tablet; Refill: 0 - Ambulatory referral to Podiatry  5. Immunization due - Administered.  - Varicella-zoster vaccine IM   Patient was given the opportunity to ask questions.  Patient verbalized understanding of the plan and was able to repeat key elements of the plan. Patient was given clear instructions to go to Emergency Department or return to medical center if symptoms don't improve, worsen, or new problems develop.The patient verbalized understanding.   Orders Placed This Encounter  Procedures   Varicella-zoster vaccine IM   Ambulatory referral to Podiatry     Requested Prescriptions   Signed Prescriptions Disp Refills   valsartan  (DIOVAN ) 160 MG tablet 90 tablet 0    Sig: Take 1 tablet (160 mg total) by mouth daily.   colchicine  0.6 MG tablet 90 tablet 0    Sig: Take 1 tablet (0.6 mg total) by mouth daily.   atorvastatin  (LIPITOR) 40 MG tablet 90 tablet 0    Sig: Take 1 tablet (40 mg total) by mouth daily.   pantoprazole  (PROTONIX ) 40 MG tablet 180 tablet 0    Sig: Take 1 tablet (40 mg total) by mouth 2 (two) times daily before a meal.    Return in about 4 weeks (around 08/17/2024) for Follow-Up or next available chronic conditions.  Greig JINNY Drones, NP

## 2024-07-29 ENCOUNTER — Other Ambulatory Visit (HOSPITAL_COMMUNITY): Payer: Self-pay

## 2024-08-16 ENCOUNTER — Ambulatory Visit

## 2024-08-16 ENCOUNTER — Other Ambulatory Visit (HOSPITAL_COMMUNITY): Payer: Self-pay

## 2024-08-16 MED ORDER — NALOXONE HCL 4 MG/0.1ML NA LIQD
NASAL | 3 refills | Status: AC
  Filled 2024-08-16: qty 2, 30d supply, fill #0

## 2024-08-16 MED ORDER — VITAMIN D (ERGOCALCIFEROL) 1.25 MG (50000 UNIT) PO CAPS
ORAL_CAPSULE | ORAL | 5 refills | Status: AC
  Filled 2024-08-16: qty 4, 28d supply, fill #0
  Filled 2024-09-12: qty 4, 28d supply, fill #1
  Filled 2024-10-25: qty 4, 28d supply, fill #2

## 2024-08-16 MED ORDER — HYDROCODONE-ACETAMINOPHEN 10-325 MG PO TABS
ORAL_TABLET | ORAL | 0 refills | Status: AC
  Filled 2024-08-18: qty 90, 30d supply, fill #0

## 2024-08-18 ENCOUNTER — Other Ambulatory Visit (HOSPITAL_COMMUNITY): Payer: Self-pay

## 2024-08-20 ENCOUNTER — Other Ambulatory Visit (HOSPITAL_COMMUNITY): Payer: Self-pay

## 2024-09-12 ENCOUNTER — Other Ambulatory Visit: Payer: Self-pay

## 2024-09-12 ENCOUNTER — Other Ambulatory Visit (HOSPITAL_COMMUNITY): Payer: Self-pay

## 2024-09-12 ENCOUNTER — Other Ambulatory Visit: Payer: Self-pay | Admitting: Family

## 2024-09-12 DIAGNOSIS — M109 Gout, unspecified: Secondary | ICD-10-CM

## 2024-09-12 DIAGNOSIS — I10 Essential (primary) hypertension: Secondary | ICD-10-CM

## 2024-09-12 DIAGNOSIS — E785 Hyperlipidemia, unspecified: Secondary | ICD-10-CM

## 2024-09-12 MED ORDER — AMLODIPINE BESYLATE 10 MG PO TABS
10.0000 mg | ORAL_TABLET | Freq: Every day | ORAL | 0 refills | Status: DC
  Filled 2024-09-12: qty 90, 90d supply, fill #0

## 2024-09-12 NOTE — Telephone Encounter (Unsigned)
 Copied from CRM #[id number]. Topic: Clinical - Medication Refill >> Sep 12, 2024 10:06 AM Janeecia G wrote: Medication: amlodipine  10 mg , valsartan  160 mg , colchicine  0.6 mg , atorvastatin  40 mg , vitamin D  50000 unit  Has the patient contacted their pharmacy? Yes (Agent: If no, request that the patient contact the pharmacy for the refill. If patient does not wish to contact the pharmacy document the reason why and proceed with request.) (Agent: If yes, when and what did the pharmacy advise?)  This is the patient's preferred pharmacy:  [facility] [address] [place] [address] Phone: [phone] Fax: [phone]  Is this the correct pharmacy for this prescription? Yes If no, delete pharmacy and type the correct one.   Has the prescription been filled recently? No  Is the patient out of the medication? Yes  Has the patient been seen for an appointment in the last year OR does the patient have an upcoming appointment? Yes  Can we respond through MyChart? Yes  Agent: Please be advised that Rx refills may take up to 3 business days. We ask that you follow-up with your pharmacy.

## 2024-09-13 ENCOUNTER — Other Ambulatory Visit (HOSPITAL_COMMUNITY): Payer: Self-pay

## 2024-09-13 NOTE — Telephone Encounter (Signed)
 Called pharmacy - Told that many medications were refill yesterday.

## 2024-09-13 NOTE — Telephone Encounter (Signed)
 Requested Prescriptions  Refused Prescriptions Disp Refills   amLODipine  (NORVASC ) 10 MG tablet 90 tablet 0    Sig: Take 1 tablet (10 mg total) by mouth daily.     Cardiovascular: Calcium  Channel Blockers 2 Failed - 09/13/2024  5:50 PM      Failed - Last BP in normal range    BP Readings from Last 1 Encounters:  07/20/24 (!) 135/90         Passed - Last Heart Rate in normal range    Pulse Readings from Last 1 Encounters:  07/20/24 78         Passed - Valid encounter within last 6 months    Recent Outpatient Visits           1 month ago Primary hypertension   [place] [facility], [place], [place]   4 months ago Muscle pain   [place] [facility], [place]   8 months ago Primary hypertension   [place] [facility], [place], [place]   12 months ago Primary hypertension   [place] [facility], [place] J, NP   1 year ago Primary hypertension   [place] [facility], Amy J, NP               colchicine  0.6 MG tablet 90 tablet 0    Sig: Take 1 tablet (0.6 mg total) by mouth daily.     Endocrinology:  Gout Agents - colchicine  Failed - 09/13/2024  5:50 PM      Failed - ALT in normal range and within 360 days    ALT  Date Value Ref Range Status  12/02/2022 45 (H) 0 - 44 IU/L Final         Failed - AST in normal range and within 360 days    AST  Date Value Ref Range Status  12/02/2022 36 0 - 40 IU/L Final         Failed - CBC within normal limits and completed in the last 12 months    WBC  Date Value Ref Range Status  06/11/2024 10.0 4.0 - 10.5 K/uL Final   RBC  Date Value Ref Range Status  06/11/2024 4.77 4.22 - 5.81 MIL/uL Final   Hemoglobin  Date Value Ref Range Status  06/11/2024 13.9 13.0 - 17.0 g/dL Final  98/90/7975 84.5 13.0 - 17.7 g/dL Final   HCT  Date Value Ref Range Status  06/11/2024 40.9 39.0 - 52.0 % Final    Hematocrit  Date Value Ref Range Status  12/02/2022 45.5 37.5 - 51.0 % Final   MCHC  Date Value Ref Range Status  06/11/2024 34.0 30.0 - 36.0 g/dL Final   [facility]  Date Value Ref Range Status  06/11/2024 29.1 26.0 - 34.0 pg Final   MCV  Date Value Ref Range Status  06/11/2024 85.7 80.0 - 100.0 fL Final  12/02/2022 85 79 - 97 fL Final   No results found for: PLTCOUNTKUC, LABPLAT, POCPLA RDW  Date Value Ref Range Status  06/11/2024 13.2 11.5 - 15.5 % Final  12/02/2022 13.2 11.6 - 15.4 % Final         Passed - Cr in normal range and within 360 days    Creatinine, Ser  Date Value Ref Range Status  06/11/2024 0.65 0.61 - 1.24 mg/dL Final         Passed -  Valid encounter within last 12 months    Recent Outpatient Visits           1 month ago Primary hypertension   [place] [facility], [place], [place]   4 months ago Muscle pain   [place] [facility], MD   8 months ago Primary hypertension   [place] [facility], [place], [place]   12 months ago Primary hypertension   [place] [facility], Amy J, NP   1 year ago Primary hypertension   [place] [facility], Amy J, NP               atorvastatin  (LIPITOR) 40 MG tablet 90 tablet 0    Sig: Take 1 tablet (40 mg total) by mouth daily.     Cardiovascular:  Antilipid - Statins Failed - 09/13/2024  5:50 PM      Failed - Lipid Panel in normal range within the last 12 months    Cholesterol, Total  Date Value Ref Range Status  01/14/2024 161 100 - 199 mg/dL Final   Cholesterol  Date Value Ref Range Status  06/11/2024 198 0 - 200 mg/dL Final   LDL Chol Calc (NIH)  Date Value Ref Range Status  01/14/2024 96 0 - 99 mg/dL Final   LDL Cholesterol  Date Value Ref Range Status  06/11/2024 144 (H) 0 - 99 mg/dL Final    Comment:           Total Cholesterol/HDL:CHD  Risk Coronary Heart Disease Risk Table                     Men   Women  1/2 Average Risk   3.4   3.3  Average Risk       5.0   4.4  2 X Average Risk   9.6   7.1  3 X Average Risk  23.4   11.0        Use the calculated Patient Ratio above and the CHD Risk Table to determine the patient's CHD Risk.        ATP III CLASSIFICATION (LDL):  <100     mg/dL   Optimal  899-870  mg/dL   Near or Above                    Optimal  130-159  mg/dL   Borderline  839-810  mg/dL   High  >809     mg/dL   Very High Performed at [facility], [address] [place], [place] [address]    LDL Direct  Date Value Ref Range Status  10/02/2020 193 (H) 0 - 99 mg/dL Final   HDL  Date Value Ref Range Status  06/11/2024 39 (L) >40 mg/dL Final  97/79/7974 38 (L) >39 mg/dL Final   Triglycerides  Date Value Ref Range Status  06/11/2024 75 <150 mg/dL Final         Passed - Patient is not pregnant      Passed - Valid encounter within last 12 months    Recent Outpatient Visits           1 month ago Primary hypertension   [place] [facility], Amy J, NP   4 months ago Muscle pain    [facility] at [facility],  Rag[place], MD   8 months ago Primary hypertension   [place] [facility], [place], [place]   12 months ago Primary hypertension   [place] [facility], [place] J, NP   1 year ago Primary hypertension   [place] [facility], Amy J, NP               Vitamin D , Ergocalciferol , (DRISDOL ) 1.25 MG (50000 UNIT) CAPS capsule 4 capsule 5    Sig: Take 1 capsule (50,000 Units total) by mouth once a week.     Endocrinology:  Vitamins - Vitamin D  Supplementation 2 Failed - 09/13/2024  5:50 PM      Failed - Manual Review: Route requests for 50,000 IU strength to the provider      Passed - Ca in normal range and within 360 days    Calcium   Date Value Ref  Range Status  06/11/2024 9.3 8.9 - 10.3 mg/dL Final         Passed - Vitamin D  in normal range and within 360 days    Vit D, 25-Hydroxy  Date Value Ref Range Status  01/14/2024 34.1 30.0 - 100.0 ng/mL Final    Comment:    Vitamin D  deficiency has been defined by the Institute of Medicine and an Endocrine Society practice guideline as a level of serum 25-OH vitamin D  less than 20 ng/mL (1,2). The Endocrine Society went on to further define vitamin D  insufficiency as a level between 21 and 29 ng/mL (2). 1. IOM (Institute of Medicine). 2010. Dietary reference    intakes for calcium  and D. [place]: The    [employer]. 2. Holick MF, Binkley [place], Bischoff-Ferrari HA, et al.    Evaluation, treatment, and prevention of vitamin D     deficiency: an Endocrine Society clinical practice    guideline. JCEM. 2011 Jul; 96(7):1911-30.          Passed - Valid encounter within last 12 months    Recent Outpatient Visits           1 month ago Primary hypertension   [place] [facility], [place], [place]   4 months ago Muscle pain   [place] [facility], [place]   8 months ago Primary hypertension   [place] [facility], [place], [place]   12 months ago Primary hypertension   [place] [facility], Amy J, NP   1 year ago Primary hypertension   [place] [facility], Amy J, NP               valsartan  (DIOVAN ) 160 MG tablet 90 tablet 0    Sig: Take 1 tablet (160 mg total) by mouth daily.     Cardiovascular:  Angiotensin Receptor Blockers Failed - 09/13/2024  5:50 PM      Failed - Last BP in normal range    BP Readings from Last 1 Encounters:  07/20/24 (!) 135/90         Passed - Cr in normal range and within 180 days    Creatinine, Ser  Date Value Ref Range Status  06/11/2024 0.65 0.61 - 1.24 mg/dL Final         Passed - K  in normal range and within 180 days    Potassium  Date Value Ref  Range Status  06/11/2024 4.1 3.5 - 5.1 mmol/L Final         Passed - Patient is not pregnant      Passed - Valid encounter within last 6 months    Recent Outpatient Visits           1 month ago Primary hypertension   [place] [facility], Amy J, NP   4 months ago Muscle pain   [place] [facility], MD   8 months ago Primary hypertension   [place] [facility], [place], [place]   12 months ago Primary hypertension   [place] [facility], [place], [place]   1 year ago Primary hypertension   [place] [facility], Greig PARAS, NP

## 2024-09-14 ENCOUNTER — Other Ambulatory Visit (HOSPITAL_COMMUNITY): Payer: Self-pay

## 2024-09-14 MED ORDER — HYDROCODONE-ACETAMINOPHEN 10-325 MG PO TABS
1.0000 | ORAL_TABLET | Freq: Three times a day (TID) | ORAL | 0 refills | Status: DC | PRN
  Filled 2024-09-14 – 2024-09-15 (×2): qty 90, 30d supply, fill #0

## 2024-09-15 ENCOUNTER — Other Ambulatory Visit: Payer: Self-pay

## 2024-09-15 ENCOUNTER — Other Ambulatory Visit (HOSPITAL_COMMUNITY): Payer: Self-pay

## 2024-10-05 ENCOUNTER — Encounter: Admitting: Family

## 2024-10-05 NOTE — Progress Notes (Signed)
 Erroneous encounter-disregard

## 2024-10-24 ENCOUNTER — Other Ambulatory Visit: Payer: Self-pay | Admitting: Family

## 2024-10-24 ENCOUNTER — Other Ambulatory Visit (HOSPITAL_COMMUNITY): Payer: Self-pay

## 2024-10-24 DIAGNOSIS — I10 Essential (primary) hypertension: Secondary | ICD-10-CM

## 2024-10-24 MED ORDER — HYDROCODONE-ACETAMINOPHEN 10-325 MG PO TABS
1.0000 | ORAL_TABLET | Freq: Three times a day (TID) | ORAL | 0 refills | Status: DC | PRN
  Filled 2024-10-24: qty 90, 30d supply, fill #0

## 2024-10-25 ENCOUNTER — Other Ambulatory Visit: Payer: Self-pay

## 2024-10-25 ENCOUNTER — Other Ambulatory Visit (HOSPITAL_COMMUNITY): Payer: Self-pay

## 2024-10-25 ENCOUNTER — Ambulatory Visit: Payer: Self-pay | Admitting: Family

## 2024-10-25 VITALS — BP 149/91 | HR 86 | Temp 98.2°F | Resp 18 | Ht 72.0 in | Wt 310.8 lb

## 2024-10-25 DIAGNOSIS — Z1321 Encounter for screening for nutritional disorder: Secondary | ICD-10-CM

## 2024-10-25 DIAGNOSIS — Z23 Encounter for immunization: Secondary | ICD-10-CM

## 2024-10-25 DIAGNOSIS — E785 Hyperlipidemia, unspecified: Secondary | ICD-10-CM

## 2024-10-25 DIAGNOSIS — I10 Essential (primary) hypertension: Secondary | ICD-10-CM

## 2024-10-25 DIAGNOSIS — K219 Gastro-esophageal reflux disease without esophagitis: Secondary | ICD-10-CM

## 2024-10-25 DIAGNOSIS — M109 Gout, unspecified: Secondary | ICD-10-CM

## 2024-10-25 MED ORDER — ROSUVASTATIN CALCIUM 5 MG PO TABS
5.0000 mg | ORAL_TABLET | Freq: Every day | ORAL | 0 refills | Status: AC
  Filled 2024-10-25: qty 90, 90d supply, fill #0

## 2024-10-25 MED ORDER — AMLODIPINE BESYLATE 10 MG PO TABS
10.0000 mg | ORAL_TABLET | Freq: Every day | ORAL | 0 refills | Status: AC
  Filled 2024-10-25: qty 90, 90d supply, fill #0
  Filled 2024-12-30: qty 30, 30d supply, fill #0

## 2024-10-25 MED ORDER — VALSARTAN 160 MG PO TABS
160.0000 mg | ORAL_TABLET | Freq: Every day | ORAL | 0 refills | Status: DC
  Filled 2024-10-25: qty 90, 90d supply, fill #0

## 2024-10-25 MED ORDER — PANTOPRAZOLE SODIUM 40 MG PO TBEC
40.0000 mg | DELAYED_RELEASE_TABLET | Freq: Two times a day (BID) | ORAL | 0 refills | Status: AC
  Filled 2024-10-25: qty 180, 90d supply, fill #0

## 2024-10-25 MED ORDER — COLCHICINE 0.6 MG PO TABS
0.6000 mg | ORAL_TABLET | Freq: Every day | ORAL | 0 refills | Status: AC
  Filled 2024-10-25: qty 90, 90d supply, fill #0

## 2024-10-25 NOTE — Telephone Encounter (Signed)
 Complete

## 2024-10-25 NOTE — Progress Notes (Signed)
 Follow up and medication refill, patient has question about his cholesterol medication that MD wilson took him off of

## 2024-10-25 NOTE — Progress Notes (Signed)
 Patient ID: Darrell Conway, male    DOB: 31-Jul-1966  MRN: [id number]  CC: Chronic Conditions Follow-Up  Subjective: Darrell Conway is a 58 y.o. male who presents for chronic conditions follow-up.  His concerns today include:  - States he never began taking Valsartan  160 mg and continued to keep taking Valsartan  80 mg due to he was unaware. States he forgot to take Valsartan  this morning. States he just ate at [employer]. He does not complain of red flag symptoms such as but not limited to chest pain, shortness of breath, worst headache of life, nausea/vomiting.  - States Raguel Blush, MD told him to quit taking Atorvastatin  due to causing muscle cramps. States he would like to try a new cholesterol medication.  - Doing well on Pantoprazole , no issues/concerns.  - Doing well on Colchicine , no issues/concerns.  - Vitamin D  lab.   Patient Active Problem List   Diagnosis Date Noted   Chest pain of uncertain etiology 06/11/2024   Unstable angina (HCC) 06/11/2024   Chest pain 06/11/2024   Neck pain 09/03/2023   Hypertensive disorder 05/23/2022   Gout 05/23/2022   Osteoarthritis of left knee 01/19/2022   Chronic pain of left ankle 12/17/2021   S/P total knee arthroplasty, right 07/30/2021   History of total knee arthroplasty 07/30/2021   Essential hypertension 01/03/2021   Osteoarthritis of right knee 11/09/2020   Pain in joint of right knee 11/09/2020   Low back pain 10/15/2020   Irregular heart beat 02/14/2020   Personal history of COVID-19 02/13/2020   Amnesia 02/13/2020   Poor concentration 02/13/2020   Muscle pain 02/13/2020   Unilateral primary osteoarthritis, left hip 01/31/2020   Osteoarthritis of left hip 01/31/2020   Morbid obesity (HCC) 06/29/2019   Primary osteoarthritis of both knees 05/05/2019   Primary osteoarthritis of both hips    Multiple joint pain 04/26/2019   Pain in left foot 04/26/2019   Pain of both hip joints 04/26/2019   Obstructive sleep apnea of  adult 04/11/2019   Laryngopharyngeal reflux 04/11/2019     Current Outpatient Medications on File Prior to Visit  Medication Sig Dispense Refill   amLODipine  (NORVASC ) 10 MG tablet Take 1 tablet (10 mg total) by mouth daily. 90 tablet 0   aspirin  EC 81 MG tablet Take 1 tablet (81 mg total) by mouth daily. Swallow whole.     EPINEPHrine  0.3 mg/0.3 mL IJ SOAJ injection Inject 0.3 mg into the muscle as needed for anaphylaxis. 2 each 2   ezetimibe  (ZETIA ) 10 MG tablet Take 1 tablet (10 mg total) by mouth daily. 30 tablet 1   HYDROcodone -acetaminophen  (NORCO) 10-325 MG tablet Take 1 tablet by mouth 3 (three) times daily as needed for pain. 90 tablet 0   naloxone  (NARCAN ) nasal spray 4 mg/0.1 mL Use 1 spray every 3 minutes; spray 1 dose into ONE nostril; alternate nostrils with each dose until help arrives 1 each 3   nitroGLYCERIN  (NITROSTAT ) 0.4 MG SL tablet Place 1 tablet (0.4 mg total) under the tongue every 5 (five) minutes as needed for chest pain (Do not give more than 3 SL tablets in 15 minutes.). 60 tablet 1   Vitamin D , Ergocalciferol , (DRISDOL ) 1.25 MG (50000 UNIT) CAPS capsule Take 1 (one) Capsule by mouth weekly 4 capsule 5   atorvastatin  (LIPITOR) 40 MG tablet Take 1 tablet (40 mg total) by mouth daily. (Patient not taking: Reported on 10/25/2024) 90 tablet 0   diclofenac  (VOLTAREN ) 75 MG EC tablet Take 1 tablet (  75 mg total) by mouth 2 (two) times daily with a meal (Patient taking differently: Take 75 mg by mouth 2 (two) times daily as needed (pain).) 60 tablet 2   HYDROcodone -acetaminophen  (NORCO) 10-325 MG tablet Take 1 tablet by mouth 3 (three) times daily as needed. (Patient taking differently: Take 1 tablet by mouth 3 (three) times daily.) 90 tablet 0   HYDROcodone -acetaminophen  (NORCO) 10-325 MG tablet Take 1 tablet by mouth three times daily, as needed for pain (Patient not taking: Reported on 10/25/2024) 90 tablet 0   HYDROcodone -acetaminophen  (NORCO) 10-325 MG tablet Take 1 tablet by  mouth 3 (three) times daily as needed for pain. (Patient not taking: Reported on 10/25/2024) 90 tablet 0   Multiple Vitamins-Minerals (MULTIVITAMIN MEN 50+) TABS Take 1 tablet by mouth daily.     naloxone  (NARCAN ) nasal spray 4 mg/0.1 mL Instill 1 spray in one nostril every 3 minutes; spray 1 dose.  Alternate nostrils with each dose until help arrives (Patient not taking: Reported on 06/11/2024) 1 each 3   triamcinolone  cream (KENALOG ) 0.1 % Apply 1 Application topically 2 (two) times a week for 30 days (Patient taking differently: Apply 1 Application topically 2 (two) times daily as needed (skin irrtation).) 45 g 2   Vitamin D , Ergocalciferol , (DRISDOL ) 1.25 MG (50000 UNIT) CAPS capsule Take 1 capsule (50,000 Units total) by mouth once a week. (Patient taking differently: Take 50,000 Units by mouth every Saturday.) 4 capsule 5   Vitamin D , Ergocalciferol , (DRISDOL ) 1.25 MG (50000 UNIT) CAPS capsule Take 1 capsule (50,000 Units total) by mouth once a week. (Patient not taking: Reported on 07/20/2024) 4 capsule 5   [DISCONTINUED] fluticasone  (FLONASE ) 50 MCG/ACT nasal spray Place 2 sprays into both nostrils daily. 16 g 6   [DISCONTINUED] orlistat  (ALLI ) 60 MG capsule Take 1 capsule (60 mg total) by mouth 3 (three) times daily with meals. 90 capsule 1   No current facility-administered medications on file prior to visit.    Allergies  Allergen Reactions   Zestril  [Lisinopril ] Hives, Swelling and Rash    Mild lip swelling    Social History   Socioeconomic History   Marital status: Married    Spouse name: Not on file   Number of children: Not on file   Years of education: Not on file   Highest education level: Some college, no degree  Occupational History   Occupation: Product/process Development Scientist  Tobacco Use   Smoking status: Former    Current packs/day: 0.00    Types: Cigarettes    Quit date: 2015    Years since quitting: 10.9    Passive exposure: Past   Smokeless tobacco: Never  Vaping Use    Vaping status: Never Used  Substance and Sexual Activity   Alcohol use: Not Currently   Drug use: No   Sexual activity: Not Currently    Birth control/protection: None  Other Topics Concern   Not on file  Social History Narrative   Not on file   Social Drivers of Health   Financial Resource Strain: Low Risk  (10/25/2024)   Overall Financial Resource Strain (CARDIA)    Difficulty of Paying Living Expenses: Not hard at all  Food Insecurity: No Food Insecurity (10/25/2024)   Hunger Vital Sign    Worried About Running Out of Food in the Last Year: Never true    Ran Out of Food in the Last Year: Never true  Transportation Needs: No Transportation Needs (10/25/2024)   PRAPARE - Transportation  Lack of Transportation (Medical): No    Lack of Transportation (Non-Medical): No  Physical Activity: Insufficiently Active (10/25/2024)   Exercise Vital Sign    Days of Exercise per Week: 1 day    Minutes of Exercise per Session: 10 min  Stress: No Stress Concern Present (10/25/2024)   [employer] of Occupational Health - Occupational Stress Questionnaire    Feeling of Stress: Not at all  Social Connections: Moderately Integrated (10/25/2024)   Social Connection and Isolation Panel    Frequency of Communication with Friends and Family: More than three times a week    Frequency of Social Gatherings with Friends and Family: More than three times a week    Attends Religious Services: More than 4 times per year    Active Member of [employer] or Organizations: No    Attends Banker Meetings: Not on file    Marital Status: Married  Intimate Partner Violence: Not At Risk (06/11/2024)   Humiliation, Afraid, Rape, and Kick questionnaire    Fear of Current or Ex-Partner: No    Emotionally Abused: No    Physically Abused: No    Sexually Abused: No    Family History  Problem Relation Age of Onset   Rheum arthritis Mother    Hypertension Mother    Rheum arthritis Sister     Past  Surgical History:  Procedure Laterality Date   HERNIA REPAIR     TOTAL KNEE ARTHROPLASTY Right 07/30/2021   Procedure: TOTAL KNEE ARTHROPLASTY;  Surgeon: Ernie Cough, MD;  Location: [facility];  Service: Orthopedics;  Laterality: Right;    ROS: Review of Systems Negative except as stated above  PHYSICAL EXAM: BP (!) 149/91   Pulse 86   Temp 98.2 F (36.8 C) (Oral)   Resp 18   Ht 6' (1.829 m)   Wt (!) 310 lb 12.8 oz (141 kg)   SpO2 93%   BMI 42.15 kg/m   Physical Exam HENT:     Head: Normocephalic and atraumatic.     Nose: Nose normal.     Mouth/Throat:     Mouth: Mucous membranes are moist.     Pharynx: Oropharynx is clear.  Eyes:     Extraocular Movements: Extraocular movements intact.     Conjunctiva/sclera: Conjunctivae normal.     Pupils: Pupils are equal, round, and reactive to light.  Cardiovascular:     Rate and Rhythm: Normal rate and regular rhythm.     Pulses: Normal pulses.     Heart sounds: Normal heart sounds.  Pulmonary:     Effort: Pulmonary effort is normal.     Breath sounds: Normal breath sounds.  Musculoskeletal:        General: Normal range of motion.     Cervical back: Normal range of motion and neck supple.  Neurological:     General: No focal deficit present.     Mental Status: He is alert and oriented to person, place, and time.  Psychiatric:        Mood and Affect: Mood normal.        Behavior: Behavior normal.     ASSESSMENT AND PLAN: 1. Primary hypertension (Primary) - Blood pressure not at goal during today's visit. Patient asymptomatic without chest pressure, chest pain, palpitations, shortness of breath, worst headache of life, and any additional red flag symptoms. - Begin Valsartan  as prescribed.  - Routine screening.  - Counseled on blood pressure goal of less than 130/80, low-sodium, DASH diet, medication compliance, and 150  minutes of moderate intensity exercise per week as tolerated. Counseled on medication adherence and adverse  effects. - Follow-up with primary provider in 4 weeks or sooner if needed.  - valsartan  (DIOVAN ) 160 MG tablet; Take 1 tablet (160 mg total) by mouth daily.  Dispense: 90 tablet; Refill: 0 - Basic Metabolic Panel  2. Hyperlipidemia, unspecified hyperlipidemia type - Begin Rosuvastatin as prescribed. Counseled on medication adherence/adverse effects.  - Follow-up with primary provider in 4 weeks or sooner if needed. - rosuvastatin (CRESTOR) 5 MG tablet; Take 1 tablet (5 mg total) by mouth daily.  Dispense: 90 tablet; Refill: 0  3. Gastroesophageal reflux disease, unspecified whether esophagitis present - Continue Pantoprazole  as prescribed. Counseled on medication adherence/adverse effects.  - Follow-up with primary provider in 3 months or sooner if needed. - pantoprazole  (PROTONIX ) 40 MG tablet; Take 1 tablet (40 mg total) by mouth 2 (two) times daily before a meal.  Dispense: 180 tablet; Refill: 0  4. Gout, unspecified cause, unspecified chronicity, unspecified site - Continue Colchicine  as prescribed. Counseled on medication adherence/adverse effects.  - Follow-up with primary provider in 3 months or sooner if needed. - colchicine  0.6 MG tablet; Take 1 tablet (0.6 mg total) by mouth daily.  Dispense: 90 tablet; Refill: 0  5. Encounter for vitamin deficiency screening - Routine screening.  - Vitamin D , 25-hydroxy  6. Immunization due - Administered.  - Flu vaccine trivalent PF, 6mos and older(Flulaval,Afluria,Fluarix,Fluzone)   Patient was given the opportunity to ask questions.  Patient verbalized understanding of the plan and was able to repeat key elements of the plan. Patient was given clear instructions to go to Emergency Department or return to medical center if symptoms don't improve, worsen, or new problems develop.The patient verbalized understanding.   Orders Placed This Encounter  Procedures   Flu vaccine trivalent PF, 6mos and older(Flulaval,Afluria,Fluarix,Fluzone)    Basic Metabolic Panel   Vitamin D , 25-hydroxy     Requested Prescriptions   Signed Prescriptions Disp Refills   valsartan  (DIOVAN ) 160 MG tablet 90 tablet 0    Sig: Take 1 tablet (160 mg total) by mouth daily.   rosuvastatin (CRESTOR) 5 MG tablet 90 tablet 0    Sig: Take 1 tablet (5 mg total) by mouth daily.   pantoprazole  (PROTONIX ) 40 MG tablet 180 tablet 0    Sig: Take 1 tablet (40 mg total) by mouth 2 (two) times daily before a meal.   colchicine  0.6 MG tablet 90 tablet 0    Sig: Take 1 tablet (0.6 mg total) by mouth daily.    Return in about 4 weeks (around 11/22/2024) for Follow-Up or next available chronic conditions.  Greig JINNY Chute, NP

## 2024-10-26 ENCOUNTER — Other Ambulatory Visit (HOSPITAL_COMMUNITY): Payer: Self-pay

## 2024-10-30 LAB — BASIC METABOLIC PANEL WITH GFR
BUN/Creatinine Ratio: 12 (ref 9–20)
BUN: 9 mg/dL (ref 6–24)
CO2: 18 mmol/L — ABNORMAL LOW (ref 20–29)
Calcium: 9.4 mg/dL (ref 8.7–10.2)
Chloride: 106 mmol/L (ref 96–106)
Creatinine, Ser: 0.77 mg/dL (ref 0.76–1.27)
Glucose: 120 mg/dL — ABNORMAL HIGH (ref 70–99)
Potassium: 4.2 mmol/L (ref 3.5–5.2)
Sodium: 146 mmol/L — ABNORMAL HIGH (ref 134–144)
eGFR: 104 mL/min/1.73 (ref >59)

## 2024-10-30 LAB — VITAMIN D 25 HYDROXY (VIT D DEFICIENCY, FRACTURES): Vit D, 25-Hydroxy: 38 ng/mL (ref 30.0–100.0)

## 2024-10-31 ENCOUNTER — Ambulatory Visit: Payer: Self-pay | Admitting: Family

## 2024-11-25 ENCOUNTER — Other Ambulatory Visit (HOSPITAL_COMMUNITY): Payer: Self-pay

## 2024-11-25 MED ORDER — HYDROCODONE-ACETAMINOPHEN 10-325 MG PO TABS
1.0000 | ORAL_TABLET | Freq: Three times a day (TID) | ORAL | 0 refills | Status: DC | PRN
  Filled 2024-11-25: qty 90, 30d supply, fill #0

## 2024-11-25 MED ORDER — VITAMIN D (ERGOCALCIFEROL) 1.25 MG (50000 UNIT) PO CAPS
50000.0000 [IU] | ORAL_CAPSULE | ORAL | 5 refills | Status: AC
  Filled 2024-11-25: qty 4, 28d supply, fill #0

## 2024-11-26 ENCOUNTER — Other Ambulatory Visit (HOSPITAL_COMMUNITY): Payer: Self-pay

## 2024-12-12 ENCOUNTER — Other Ambulatory Visit (HOSPITAL_COMMUNITY): Payer: Self-pay

## 2024-12-13 ENCOUNTER — Telehealth: Payer: Self-pay

## 2024-12-13 NOTE — Telephone Encounter (Signed)
 Pt scheduled

## 2024-12-13 NOTE — Telephone Encounter (Signed)
 Copied from CRM [id number]. Topic: Clinical - Medication Question >> Dec 13, 2024  8:17 AM Darrell Conway wrote: Reason for CRM: Patient called in requesting to start a weight loss medication.  Patient is requesting a call back to further discuss.   May you please assist.

## 2024-12-23 ENCOUNTER — Other Ambulatory Visit (HOSPITAL_COMMUNITY): Payer: Self-pay

## 2024-12-23 MED ORDER — HYDROCODONE-ACETAMINOPHEN 10-325 MG PO TABS
1.0000 | ORAL_TABLET | Freq: Three times a day (TID) | ORAL | 0 refills | Status: AC | PRN
  Filled 2024-12-23: qty 90, 30d supply, fill #0

## 2024-12-23 MED ORDER — VITAMIN D (ERGOCALCIFEROL) 1.25 MG (50000 UNIT) PO CAPS
ORAL_CAPSULE | ORAL | 5 refills | Status: AC
  Filled 2024-12-23: qty 4, 30d supply, fill #0
  Filled 2024-12-30: qty 4, 30d supply, fill #1

## 2024-12-30 ENCOUNTER — Other Ambulatory Visit (HOSPITAL_COMMUNITY): Payer: Self-pay

## 2024-12-30 ENCOUNTER — Other Ambulatory Visit: Payer: Self-pay

## 2024-12-30 ENCOUNTER — Other Ambulatory Visit: Payer: Self-pay | Admitting: Family

## 2024-12-30 DIAGNOSIS — I10 Essential (primary) hypertension: Secondary | ICD-10-CM

## 2024-12-30 MED ORDER — VALSARTAN 160 MG PO TABS
160.0000 mg | ORAL_TABLET | Freq: Every day | ORAL | 0 refills | Status: AC
  Filled 2024-12-30: qty 90, 90d supply, fill #0

## 2024-12-30 NOTE — Telephone Encounter (Signed)
 Complete

## 2025-01-11 ENCOUNTER — Ambulatory Visit: Payer: Self-pay | Admitting: Family
